# Patient Record
Sex: Male | Born: 1970 | Race: Black or African American | Hispanic: No | Marital: Single | Smoking: Current every day smoker
Health system: Southern US, Community
[De-identification: ages and names within clinical notes are randomized; demographics above are authoritative.]

## PROBLEM LIST (undated history)

## (undated) DIAGNOSIS — E785 Hyperlipidemia, unspecified: Secondary | ICD-10-CM

---

## 2004-06-19 ENCOUNTER — Emergency Department: Payer: Self-pay | Admitting: Emergency Medicine

## 2004-12-17 ENCOUNTER — Emergency Department: Payer: Self-pay | Admitting: Emergency Medicine

## 2005-06-29 ENCOUNTER — Emergency Department: Payer: Self-pay | Admitting: Emergency Medicine

## 2006-04-05 ENCOUNTER — Emergency Department: Payer: Self-pay | Admitting: General Practice

## 2008-02-07 IMAGING — CR DG KNEE COMPLETE 4+V*R*
1 series · 4 of 4 positions shown · non-contrast
Comparison: none

REASON FOR EXAM: fall   pain      rm 3
COMMENTS:

PROCEDURE:     DXR - DXR KNEE RT COMP WITH OBLIQUES  - April 05, 2006  [DATE]
RESULT:     Four views of the knee reveal the bones to be adequately
mineralized. I do not see evidence of an acute fracture nor of dislocation.
No joint effusion is seen.

[Series 1: view not recorded · 0.17mm/px · 4 of 4 slices shown]
[im 1/4]
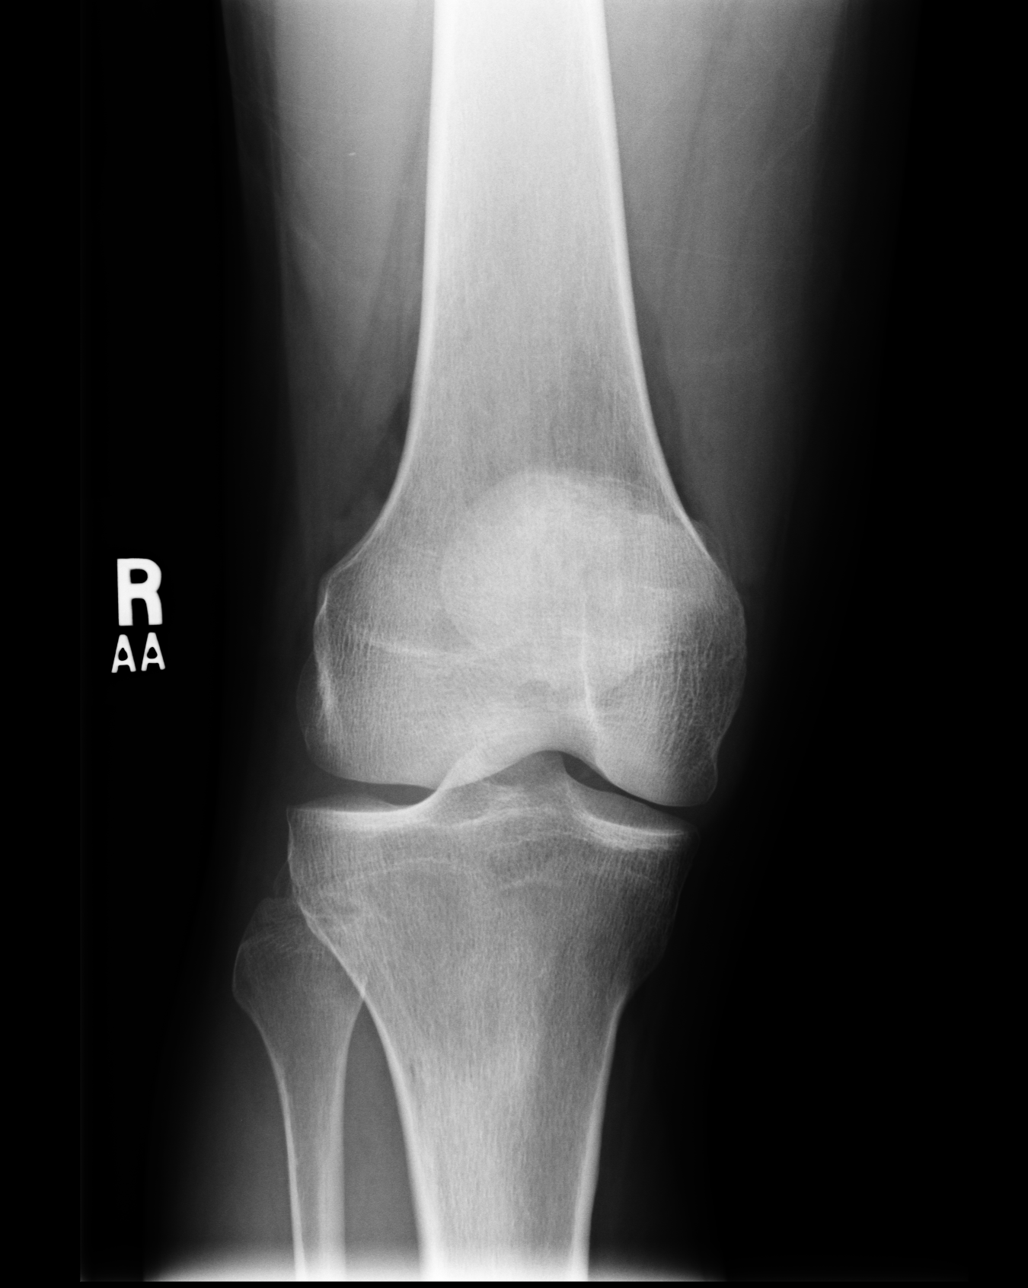
[im 2/4]
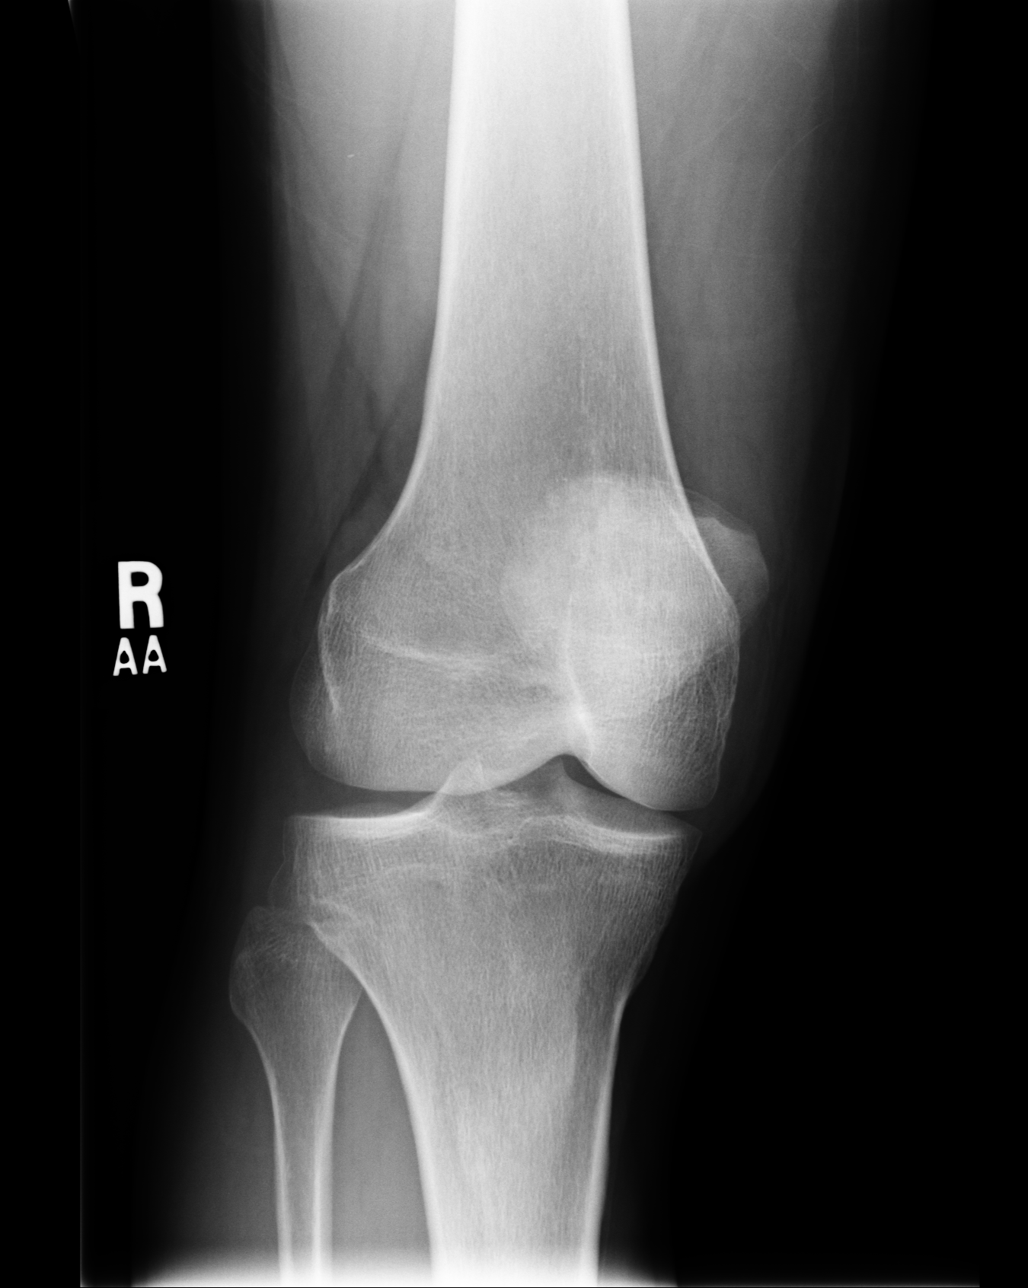
[im 3/4]
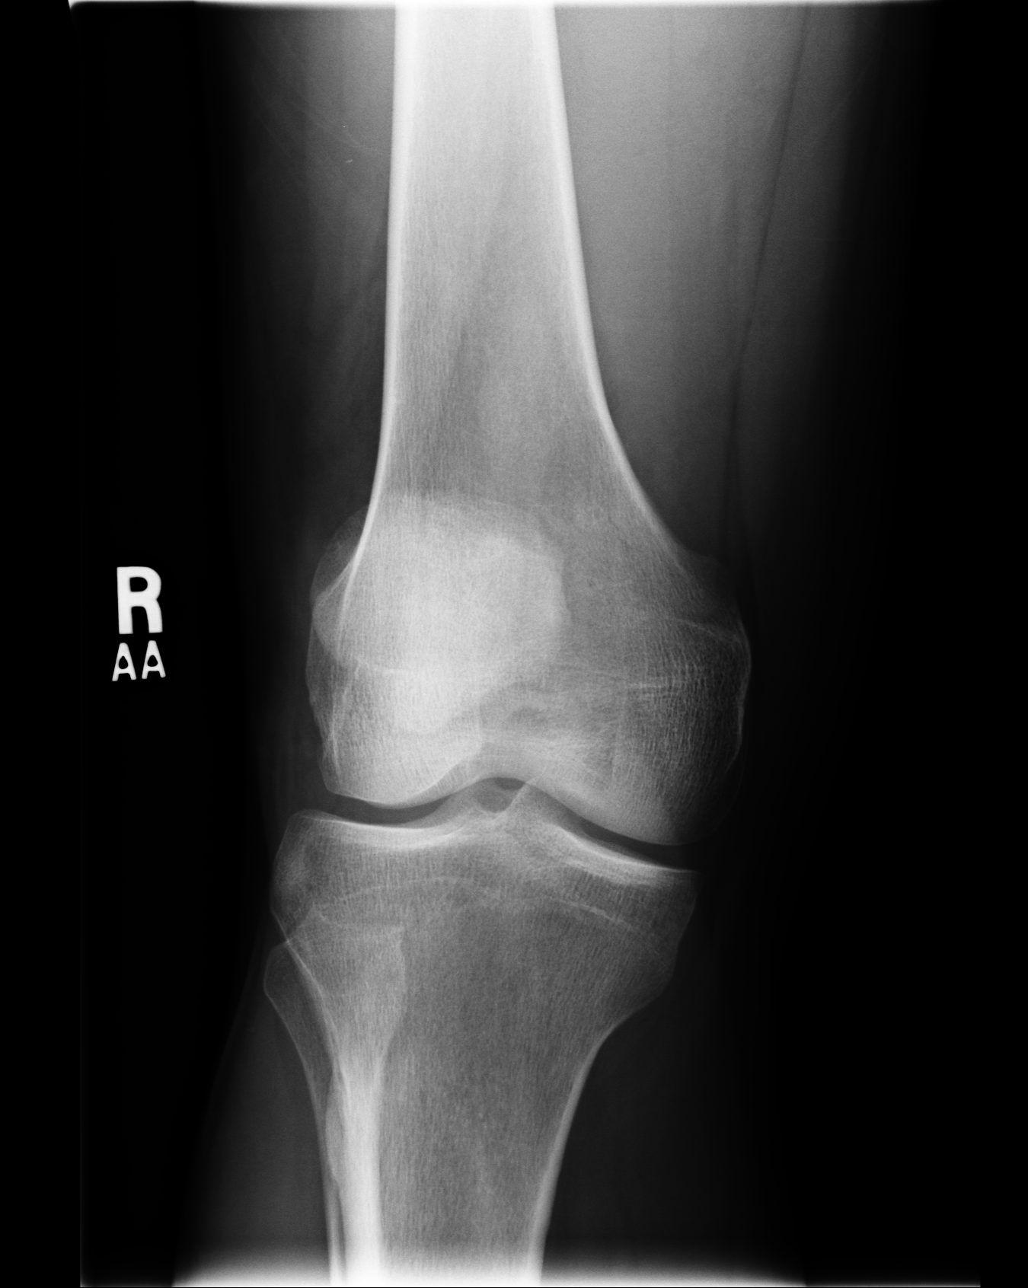
[im 4/4]
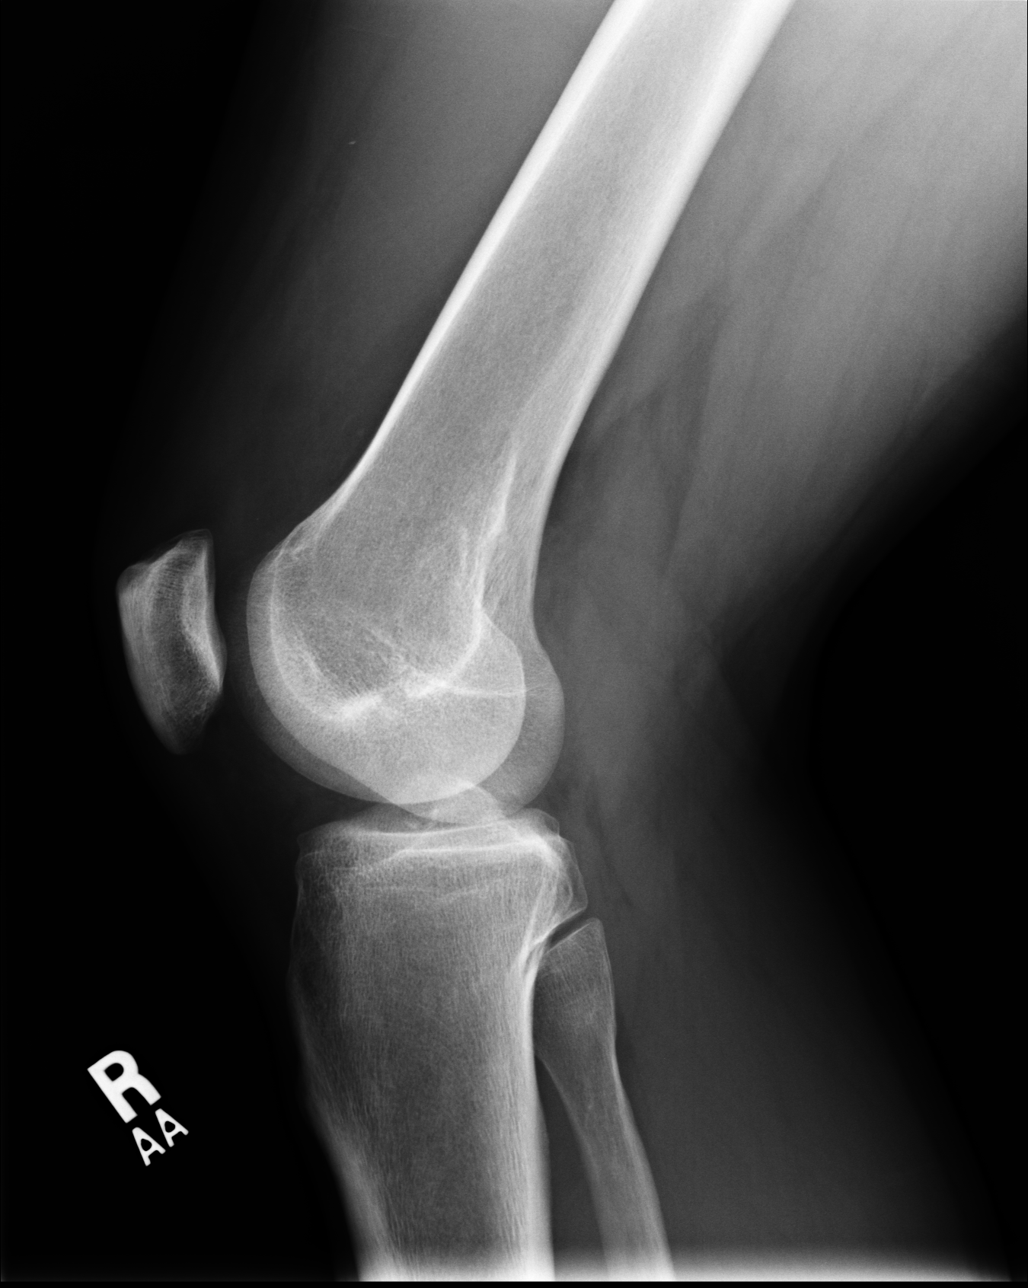

[4 of 4 positions shown; findings below may reference images not displayed]

IMPRESSION: 1)I see no acute bony abnormality of the RIGHT knee.

## 2011-03-09 ENCOUNTER — Ambulatory Visit: Payer: Self-pay | Admitting: Unknown Physician Specialty

## 2011-03-18 ENCOUNTER — Ambulatory Visit: Payer: Self-pay | Admitting: Unknown Physician Specialty

## 2011-04-18 ENCOUNTER — Ambulatory Visit: Payer: Self-pay | Admitting: Unknown Physician Specialty

## 2011-05-19 ENCOUNTER — Ambulatory Visit: Payer: Self-pay | Admitting: Unknown Physician Specialty

## 2015-05-17 ENCOUNTER — Encounter: Payer: Self-pay | Admitting: Emergency Medicine

## 2015-05-17 ENCOUNTER — Emergency Department
Admission: EM | Admit: 2015-05-17 | Discharge: 2015-05-17 | Disposition: A | Discharge status: Home / Self Care | Payer: Self-pay | Attending: Emergency Medicine | Admitting: Emergency Medicine

## 2015-05-17 DIAGNOSIS — H6092 Unspecified otitis externa, left ear: Secondary | ICD-10-CM | POA: Insufficient documentation

## 2015-05-17 DIAGNOSIS — Z720 Tobacco use: Secondary | ICD-10-CM | POA: Insufficient documentation

## 2015-05-17 MED ORDER — CIPROFLOXACIN-DEXAMETHASONE 0.3-0.1 % OT SUSP
4.0000 [drp] | Freq: Two times a day (BID) | OTIC | Status: DC
  Administered 2015-05-17: 4 [drp] via OTIC
  Filled 2015-05-17: qty 7.5

## 2015-05-17 MED ORDER — CIPROFLOXACIN HCL 500 MG PO TABS: 500.0000 mg | ORAL_TABLET | Freq: Two times a day (BID) | ORAL | Status: AC

## 2015-05-17 MED ORDER — CIPROFLOXACIN HCL 500 MG PO TABS
ORAL_TABLET | ORAL | Status: AC
  Filled 2015-05-17: qty 1

## 2015-05-17 MED ORDER — CIPROFLOXACIN-DEXAMETHASONE 0.3-0.1 % OT SUSP: 4.0000 [drp] | Freq: Two times a day (BID) | OTIC | Status: DC

## 2015-05-17 MED ORDER — CIPROFLOXACIN HCL 500 MG PO TABS
500.0000 mg | ORAL_TABLET | Freq: Once | ORAL | Status: AC
  Administered 2015-05-17: 500 mg via ORAL

## 2015-05-17 NOTE — ED Provider Notes (Signed)
[facility] Emergency Department Provider Note ____________________________________________  Time seen: 2035  I have reviewed the triage vital signs and the nursing notes.  HISTORY  Chief Complaint  Ear Drainage  HPI [name] is a 44 y.o. male reports to the ED with complaints of left ear drainage for the last week. He describes these had some increased purulent drainage, swelling, and decreased hearing to the left ear. 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 Over the last week he had been wearing his earplugs, but noticed some increased irritation with that. so in the last few days, he has been wearing earmuffs, given he's had some drainage from the ear. He reported today because he was concerned after he removed his earmuffs he noticed what he thought was a maggot in the cuff of the earmuffs, which she assumed came from his ear. He is here for evaluation management of his symptoms. Eyes any fever, chills, sweats, or dizziness.  History reviewed. No pertinent past medical history.  There are no active problems to display for this patient.  History reviewed. No pertinent past surgical history.  Current Outpatient Rx  Name  Route  Sig  Dispense  Refill  . ciprofloxacin (CIPRO) 500 MG tablet   Oral   Take 1 tablet (500 mg total) by mouth 2 (two) times daily.   14 tablet   0   . ciprofloxacin-dexamethasone (CIPRODEX) otic suspension   Left Ear   Place 4 drops into the left ear 2 (two) times daily.   7.5 mL   0    Allergies Review of patient's allergies indicates no known allergies.  History reviewed. No pertinent family history.  Social History Social History  Substance Use Topics  . Smoking status: Current Every Day Smoker -- 0.50 packs/day for 25 years    Types: Cigarettes  . Smokeless tobacco: None  . Alcohol Use: Yes   Review of Systems  Constitutional: Negative for fever. Eyes: Negative for  visual changes. ENT: Negative for sore throat. Left ear drainage.  Cardiovascular: Negative for chest pain. Respiratory: Negative for shortness of breath. Gastrointestinal: Negative for abdominal pain, vomiting and diarrhea. Genitourinary: Negative for dysuria. Musculoskeletal: Negative for back pain. Skin: Negative for rash. Neurological: Negative for headaches, focal weakness or numbness. ____________________________________________  PHYSICAL EXAM:  VITAL SIGNS: ED Triage Vitals  Enc Vitals Group     BP 05/17/15 1930 138/86 mmHg     Pulse Rate 05/17/15 1930 77     Resp 05/17/15 1930 18     Temp 05/17/15 1930 98.1 F (36.7 C)     Temp Source 05/17/15 1930 Oral     SpO2 05/17/15 1930 99 %     Weight 05/17/15 1930 172 lb (78.019 kg)     Height 05/17/15 1930 5\' 11"  (1.803 m)     Head Cir --      Peak Flow --      Pain Score 05/17/15 1931 4     Pain Loc --      Pain Edu? --      Excl. in GC? --    Constitutional: Alert and oriented. Well appearing and in no distress. Eyes: Conjunctivae are normal. PERRL. Normal extraocular movements. Ears: Left ear with edema and erythema to the pinna. Canal is swollen and TM is obscured by copious amount of purulent material. Minimally tender to manipulation of the tragus and pinna.   Head: Normocephalic and atraumatic.  Nose: No congestion/rhinnorhea.   Mouth/Throat: Mucous membranes are moist.   Neck: Supple. No thyromegaly. Hematological/Lymphatic/Immunilogical: No cervical lymphadenopathy. Cardiovascular: Normal rate, regular rhythm.  Respiratory: Normal respiratory effort. No wheezes/rales/rhonchi. Gastrointestinal: Soft and nontender. No distention. Musculoskeletal: Nontender with normal range of motion in all extremities.  Neurologic:  Normal gait without ataxia. Normal speech and language. No gross focal neurologic deficits are appreciated. Skin:  Skin is warm, dry and intact. No rash noted. Psychiatric: Mood and  affect are normal. Patient exhibits appropriate insight and judgment. _________  PROCEDURES Ear wick inserted in left ear Ciprodex 4 gtts to left ear ____________________________________________  INITIAL IMPRESSION / ASSESSMENT AND PLAN / ED COURSE  Acute left otitis externa with local ear cellulitis. Treatment with Ciprodex otic solution and Cipro BID x 7 days. Follow-up with Dr. Elenore Rota as needed.  ____________________________________________  FINAL CLINICAL IMPRESSION(S) / ED DIAGNOSES  Final diagnoses:  Otitis externa of left ear     Lissa Hoard, PA-C 05/17/15 2057  Myrna Blazer, MD 05/17/15 [phone]

## 2015-05-17 NOTE — ED Notes (Signed)
Pt arrived to the ED for discomfort on his left ear. Pt states that he wears ear protection at work and today he noticed that a maggot came out of his ear. Pt is AOx4, anxious.

## 2015-05-17 NOTE — Discharge Instructions (Signed)
Draining Ear °Ear wax, pus, blood and other fluids are examples of the different types of drainage from ears. Drops or cream may be needed to lessen the itching which may occur with ear drainage. °CAUSES  °· Skin irritations in the ear. °· Ear infection. °· Swimmer's ear. °· Ruptured eardrum. °· Foreign object in the ear canal. °· Sudden pressure changes. °· Head injury. °HOME CARE INSTRUCTIONS  °· Only take over-the-counter or prescription medicines for pain, fever, or discomfort as directed by your caregiver. °· Do not rub the ear canal with cotton-tipped swabs. °· Do not swim until your caregiver says it is okay. °· Before you take a shower, cover a cotton ball with petroleum jelly to keep water out. °· Limit exposure to smoke. Secondhand smoke can increase the chance for ear infections. °· Keep up with immunizations. °· Wash your hands well. °· Keep all follow-up appointments to examine the ear and evaluate hearing. °SEEK MEDICAL CARE IF:  °· You have increased drainage. °· You have ear pain, a fever, or drainage that is not getting better after 48 hours of antibiotics. °· You are unusually tired. °SEEK IMMEDIATE MEDICAL CARE IF: °· You have severe ear pain or headache. °· The patient is older than 3 months with a rectal or oral temperature of 102° F (38.9° C) or higher. °· The patient is 3 months old or younger with a rectal temperature of 100.4° F (38° C) or higher. °· You vomit. °· You feel dizzy. °· You have a seizure. °· You have new hearing loss. °MAKE SURE YOU:  °· Understand these instructions. °· Will watch your condition. °· Will get help right away if you are not doing well or get worse. °Document Released: 09/03/2005 Document Revised: 11/26/2011 Document Reviewed: 07/07/2009 °Exit[employer]® Patient Information ©2015 [employer]. This information is not intended to replace advice given to you by your health care provider. Make sure you discuss any questions you have with your health care  provider. ° °Otitis Externa °Otitis externa is a bacterial or fungal infection of the outer ear canal. This is the area from the eardrum to the outside of the ear. Otitis externa is sometimes called "swimmer's ear." °CAUSES  °Possible causes of infection include: °· Swimming in dirty water. °· Moisture remaining in the ear after swimming or bathing. °· Mild injury (trauma) to the ear. °· Objects stuck in the ear (foreign body). °· Cuts or scrapes (abrasions) on the outside of the ear. °SIGNS AND SYMPTOMS  °The first symptom of infection is often itching in the ear canal. Later signs and symptoms may include swelling and redness of the ear canal, ear pain, and yellowish-white fluid (pus) coming from the ear. The ear pain may be worse when pulling on the earlobe. °DIAGNOSIS  °Your health care provider will perform a physical exam. A sample of fluid may be taken from the ear and examined for bacteria or fungi. °TREATMENT  °Antibiotic ear drops are often given for 10 to 14 days. Treatment may also include pain medicine or corticosteroids to reduce itching and swelling. °HOME CARE INSTRUCTIONS  °· Apply antibiotic ear drops to the ear canal as prescribed by your health care provider. °· Take medicines only as directed by your health care provider. °· If you have diabetes, follow any additional treatment instructions from your health care provider. °· Keep all follow-up visits as directed by your health care provider. °PREVENTION  °· Keep your ear dry. Use the corner of a towel to absorb   water out of the ear canal after swimming or bathing.  Avoid scratching or putting objects inside your ear. This can damage the ear canal or remove the protective wax that lines the canal. This makes it easier for bacteria and fungi to grow.  Avoid swimming in lakes, polluted water, or poorly chlorinated pools.  You may use ear drops made of rubbing alcohol and vinegar after swimming. Combine equal parts of white vinegar and alcohol  in a bottle. Put 3 or 4 drops into each ear after swimming. SEEK MEDICAL CARE IF:   You have a fever.  Your ear is still red, swollen, painful, or draining pus after 3 days.  Your redness, swelling, or pain gets worse.  You have a severe headache.  You have redness, swelling, pain, or tenderness in the area behind your ear. MAKE SURE YOU:   Understand these instructions.  Will watch your condition.  Will get help right away if you are not doing well or get worse. Document Released: 09/03/2005 Document Revised: 01/18/2014 Document Reviewed: 09/20/2011 [facility] Patient Information 2015 [place], [place]. This information is not intended to replace advice given to you by your health care provider. Make sure you discuss any questions you have with your health care provider.  Use the ear drops as directed. Follow-up with Dr. Elenore Rota as needed.

## 2015-05-17 NOTE — ED Notes (Signed)
Patient thinks he has something in the left ear. States it has been going on for a week and a half but today he took his ear muffs off and "saw a worm, and I know that came out of my ear." Patient denies that it is painful but states that it has been draining. States "it's got me nervous."

## 2017-07-22 ENCOUNTER — Emergency Department
Admission: EM | Admit: 2017-07-22 | Discharge: 2017-07-22 | Disposition: A | Discharge status: Home / Self Care | Payer: Non-veteran care | Attending: Emergency Medicine | Admitting: Emergency Medicine

## 2017-07-22 DIAGNOSIS — F1290 Cannabis use, unspecified, uncomplicated: Secondary | ICD-10-CM | POA: Diagnosis not present

## 2017-07-22 DIAGNOSIS — F17210 Nicotine dependence, cigarettes, uncomplicated: Secondary | ICD-10-CM | POA: Insufficient documentation

## 2017-07-22 DIAGNOSIS — F1410 Cocaine abuse, uncomplicated: Secondary | ICD-10-CM

## 2017-07-22 DIAGNOSIS — J0100 Acute maxillary sinusitis, unspecified: Secondary | ICD-10-CM | POA: Diagnosis not present

## 2017-07-22 HISTORY — DX: Hyperlipidemia, unspecified: E78.5

## 2017-07-22 LAB — COMPREHENSIVE METABOLIC PANEL
ALBUMIN: 4.5 g/dL (ref 3.5–5.0)
ALT: 22 U/L (ref 17–63)
ANION GAP: 10 (ref 5–15)
AST: 24 U/L (ref 15–41)
Alkaline Phosphatase: 63 U/L (ref 38–126)
BILIRUBIN TOTAL: 1.1 mg/dL (ref 0.3–1.2)
BUN: 15 mg/dL (ref 6–20)
CO2: 26 mmol/L (ref 22–32)
Calcium: 9.1 mg/dL (ref 8.9–10.3)
Chloride: 101 mmol/L (ref 101–111)
Creatinine, Ser: 1.34 mg/dL — ABNORMAL HIGH (ref 0.61–1.24)
GFR calc non Af Amer: >60 mL/min (ref >60)
GLUCOSE: 116 mg/dL — AB (ref 65–99)
POTASSIUM: 3.6 mmol/L (ref 3.5–5.1)
Sodium: 137 mmol/L (ref 135–145)
TOTAL PROTEIN: 7.6 g/dL (ref 6.5–8.1)

## 2017-07-22 LAB — CBC
HCT: 42.6 % (ref 40.0–52.0)
Hemoglobin: 14.3 g/dL (ref 13.0–18.0)
MCH: 30 pg (ref 26.0–34.0)
MCHC: 33.6 g/dL (ref 32.0–36.0)
MCV: 89.2 fL (ref 80.0–100.0)
Platelets: 185 10*3/uL (ref 150–440)
RBC: 4.78 MIL/uL (ref 4.40–5.90)
RDW: 14.8 % — ABNORMAL HIGH (ref 11.5–14.5)
WBC: 5.9 10*3/uL (ref 3.8–10.6)

## 2017-07-22 LAB — ACETAMINOPHEN LEVEL

## 2017-07-22 LAB — SALICYLATE LEVEL

## 2017-07-22 LAB — ETHANOL

## 2017-07-22 MED ORDER — AMOXICILLIN-POT CLAVULANATE 875-125 MG PO TABS: 1.0000 | ORAL_TABLET | Freq: Two times a day (BID) | ORAL | 0 refills | Status: AC

## 2017-07-22 NOTE — ED Notes (Signed)

## 2017-07-22 NOTE — Discharge Instructions (Signed)

## 2017-07-22 NOTE — ED Notes (Signed)
BEHAVIORAL HEALTH ROUNDING Patient sleeping: Yes.   Patient alert and oriented: not applicable SLEEPING Behavior appropriate: Yes.  ; If no, describe: SLEEPING Nutrition and fluids offered: No SLEEPING Toileting and hygiene offered: NoSLEEPING Sitter present: not applicable, Q 15 min safety rounds and observation. Law enforcement present: Yes ODS 

## 2017-07-22 NOTE — ED Notes (Signed)
Pt reports hx of crack cocaine use and states he is just tired of living like he has been. States using all weekend. Pt reports he has never been through detox before. Pt is also complaining of sinus congestion.

## 2017-07-22 NOTE — [facility] Counselor (Signed)
Spoke with the patient about his drug use.  He stated he wants help to stop using crack.  He also uses marijuana.  He denied any excessive use of any other drugs or alcohol.  He has veteran's benefits, and not sure what services he can obtain.  He was given information about [facility] and [facility].  He was also advised to call the [facility] to determine what services they could provide for him.

## 2017-07-22 NOTE — ED Provider Notes (Signed)
[facility] Emergency Department Provider Note   ____________________________________________   First MD Initiated Contact with Patient 07/22/17 0131     (approximate)  I have reviewed the triage vital signs and the nursing notes.   HISTORY  Chief Complaint Addiction Problem    HPI [name] is a 46 y.o. male presents for evaluation to get treatment for cocaine abuse  Patient reports that he has been using cocaine for about 20 years.  Last week he does use cocaine daily, and estimates he spent about $500 on cocaine over this weekend.  Reports he has run out of money, and that he feels like he needs to make a life change and get clean.  Denies any inciting factors other than running out of money, and feeling tired of using regularly.  Denies thoughts of wanting to harm himself or anyone else.  Does occasionally drink alcohol, but reports that is not a daily issue for him and he does not experience withdrawals.  He did have some alcohol earlier today, but none over the evening.  No fevers or chills.  No nausea or vomiting.  Denies any concerns or pain except he has been experiencing some pain over his left frontal sinus for about a month with occasional drainage, feels like he is developed a "sinus infection".  Denies any hallucinations.  Past Medical History:  Diagnosis Date  . Hyperlipemia     There are no active problems to display for this patient.   History reviewed. No pertinent surgical history.  Prior to Admission medications   Medication Sig Start Date End Date Taking? Authorizing Provider  ciprofloxacin-dexamethasone (CIPRODEX) otic suspension Place 4 drops into the left ear 2 (two) times daily. 05/17/15   Menshew, Charlesetta Ivory, PA-C    Allergies Patient has no known allergies.  No family history on file.  Social History Social History   Tobacco Use  . Smoking status: Current Every Day Smoker    Packs/day: 0.50    Years: 25.00      Pack years: 12.50    Types: Cigarettes  . Smokeless tobacco: Never Used  Substance Use Topics  . Alcohol use: Yes  . Drug use: Yes    Types: Cocaine, Marijuana    Review of Systems Constitutional: No fever/chills Eyes: No visual changes. ENT: No sore throat.  See HPI Cardiovascular: Denies chest pain. Respiratory: Denies shortness of breath. Gastrointestinal: No abdominal pain.  No nausea, no vomiting.  No diarrhea.  No constipation. Genitourinary: Negative for dysuria. Musculoskeletal: Negative for back pain. Skin: Negative for rash. Neurological: Negative for focal weakness or numbness.    ____________________________________________   PHYSICAL EXAM:  VITAL SIGNS: ED Triage Vitals  Enc Vitals Group     BP 07/22/17 0054 121/78     Pulse Rate 07/22/17 0054 82     Resp 07/22/17 0054 19     Temp 07/22/17 0054 98.3 F (36.8 C)     Temp Source 07/22/17 0054 Oral     SpO2 07/22/17 0054 99 %     Weight 07/22/17 0054 180 lb (81.6 kg)     Height 07/22/17 0054 5\' 11"  (1.803 m)     Head Circumference --      Peak Flow --      Pain Score 07/22/17 0053 8     Pain Loc --      Pain Edu? --      Excl. in GC? --     Constitutional: Alert and oriented.  Well appearing and in no acute distress. Eyes: Conjunctivae are normal. Head: Atraumatic. Nose: Mild clear Charissa, he reports left maxillary sinus tenderness. Mouth/Throat: Mucous membranes are moist. Neck: No stridor.   Cardiovascular: Normal rate, regular rhythm. Grossly normal heart sounds.  Good peripheral circulation. Respiratory: Normal respiratory effort.  No retractions. Lungs CTAB. Gastrointestinal: Soft and nontender. No distention. Musculoskeletal: No lower extremity tenderness nor edema. Neurologic:  Normal speech and language. No gross focal neurologic deficits are appreciated.  Skin:  Skin is warm, dry and intact. No rash noted. Psychiatric: Mood and affect are normal. Speech and behavior are  normal.  ____________________________________________   LABS (all labs ordered are listed, but only abnormal results are displayed)  Labs Reviewed  COMPREHENSIVE METABOLIC PANEL - Abnormal; Notable for the following components:      Result Value   Glucose, Bld 116 (*)    Creatinine, Ser 1.34 (*)    All other components within normal limits  CBC - Abnormal; Notable for the following components:   RDW 14.8 (*)    All other components within normal limits  ACETAMINOPHEN LEVEL - Abnormal; Notable for the following components:   Acetaminophen (Tylenol), Serum <10 (*)    All other components within normal limits  ETHANOL  SALICYLATE LEVEL  URINE DRUG SCREEN, QUALITATIVE ([facility] ONLY)   ____________________________________________  EKG   ____________________________________________  RADIOLOGY   ____________________________________________   PROCEDURES  Procedure(s) performed: None  Procedures  Critical Care performed: No  ____________________________________________   INITIAL IMPRESSION / ASSESSMENT AND PLAN / ED COURSE  Pertinent labs & imaging results that were available during my care of the patient were reviewed by me and considered in my medical decision making (see chart for details).  Substance abuse, reports cocaine use and wishes for detox.  He is alert and oriented, does not appear presently intoxicated or under the influence.  We will refer him to residential treatment services for outpatient management.  Sinus congestion, appears likely left maxillary sinusitis by exam without complication.  Reports symptoms for a month, at this point I will place him on antibiotic for treatment.  Return precautions and treatment recommendations and follow-up discussed with the patient who is agreeable with the plan.       ____________________________________________   FINAL CLINICAL IMPRESSION(S) / ED DIAGNOSES  Final diagnoses:  Cocaine abuse (HCC)  Acute  non-recurrent maxillary sinusitis      NEW MEDICATIONS STARTED DURING THIS VISIT:  This SmartLink is deprecated. Use AVSMEDLIST instead to display the medication list for a patient.   Note:  This document was prepared using Dragon voice recognition software and may include unintentional dictation errors.     Sharyn CreamerQuale, Maliah Pyles, MD 07/22/17 [phone]

## 2017-07-22 NOTE — ED Triage Notes (Signed)
Patient requesting help with detox from cocaine. Patient reports last used approximately 5 hours.

## 2017-08-29 ENCOUNTER — Emergency Department
Admission: EM | Admit: 2017-08-29 | Discharge: 2017-08-29 | Disposition: A | Discharge status: Home / Self Care | Payer: Non-veteran care | Attending: Emergency Medicine | Admitting: Emergency Medicine

## 2017-08-29 ENCOUNTER — Emergency Department: Payer: Non-veteran care

## 2017-08-29 ENCOUNTER — Encounter: Payer: Self-pay | Admitting: Emergency Medicine

## 2017-08-29 DIAGNOSIS — E785 Hyperlipidemia, unspecified: Secondary | ICD-10-CM | POA: Insufficient documentation

## 2017-08-29 DIAGNOSIS — F17210 Nicotine dependence, cigarettes, uncomplicated: Secondary | ICD-10-CM | POA: Insufficient documentation

## 2017-08-29 DIAGNOSIS — L03116 Cellulitis of left lower limb: Secondary | ICD-10-CM

## 2017-08-29 DIAGNOSIS — L97529 Non-pressure chronic ulcer of other part of left foot with unspecified severity: Secondary | ICD-10-CM

## 2017-08-29 LAB — COMPREHENSIVE METABOLIC PANEL
ALK PHOS: 68 U/L (ref 38–126)
ALT: 27 U/L (ref 17–63)
AST: 26 U/L (ref 15–41)
Albumin: 4.4 g/dL (ref 3.5–5.0)
Anion gap: 9 (ref 5–15)
BUN: 16 mg/dL (ref 6–20)
CHLORIDE: 104 mmol/L (ref 101–111)
CO2: 24 mmol/L (ref 22–32)
CREATININE: 1.12 mg/dL (ref 0.61–1.24)
Calcium: 9.1 mg/dL (ref 8.9–10.3)
GFR calc Af Amer: >60 mL/min (ref >60)
GLUCOSE: 100 mg/dL — AB (ref 65–99)
POTASSIUM: 3.7 mmol/L (ref 3.5–5.1)
SODIUM: 137 mmol/L (ref 135–145)
Total Bilirubin: 0.6 mg/dL (ref 0.3–1.2)
Total Protein: 7.4 g/dL (ref 6.5–8.1)

## 2017-08-29 LAB — CBC WITH DIFFERENTIAL/PLATELET
Basophils Absolute: 0 10*3/uL (ref 0–0.1)
Basophils Relative: 0 %
EOS ABS: 0.2 10*3/uL (ref 0–0.7)
EOS PCT: 3 %
HCT: 44.5 % (ref 40.0–52.0)
Hemoglobin: 15 g/dL (ref 13.0–18.0)
LYMPHS ABS: 2.6 10*3/uL (ref 1.0–3.6)
LYMPHS PCT: 35 %
MCH: 30.7 pg (ref 26.0–34.0)
MCHC: 33.7 g/dL (ref 32.0–36.0)
MCV: 91.1 fL (ref 80.0–100.0)
MONO ABS: 0.8 10*3/uL (ref 0.2–1.0)
Monocytes Relative: 11 %
Neutro Abs: 3.8 10*3/uL (ref 1.4–6.5)
Neutrophils Relative %: 51 %
PLATELETS: 197 10*3/uL (ref 150–440)
RBC: 4.88 MIL/uL (ref 4.40–5.90)
RDW: 14.8 % — AB (ref 11.5–14.5)
WBC: 7.5 10*3/uL (ref 3.8–10.6)

## 2017-08-29 LAB — LACTIC ACID, PLASMA: Lactic Acid, Venous: 0.9 mmol/L (ref 0.5–1.9)

## 2017-08-29 MED ORDER — SULFAMETHOXAZOLE-TRIMETHOPRIM 800-160 MG PO TABS: 1.0000 | ORAL_TABLET | Freq: Two times a day (BID) | ORAL | 0 refills | Status: AC

## 2017-08-29 MED ORDER — VANCOMYCIN HCL IN DEXTROSE 1-5 GM/200ML-% IV SOLN
1000.0000 mg | Freq: Once | INTRAVENOUS | Status: AC
  Administered 2017-08-29: 1000 mg via INTRAVENOUS
  Filled 2017-08-29: qty 200

## 2017-08-29 MED ORDER — TRAMADOL HCL 50 MG PO TABS: 50.0000 mg | ORAL_TABLET | Freq: Four times a day (QID) | ORAL | 0 refills | Status: AC | PRN

## 2017-08-29 NOTE — ED Provider Notes (Signed)
[facility] Emergency Department Provider Note  Time seen: 8:13 PM  I have reviewed the triage vital signs and the nursing notes.   HISTORY  Chief Complaint Foot Pain    HPI [name] is a 46 y.o. male with a past medical history of hyperlipidemia who presents to the emergency department for left foot ulceration/infection.  According to the patient for the past 2 months he has been experiencing some discomfort and what appeared to be a ulcer and blister to the left fifth toe.  He states the blister had progressively worsened and several days ago it ruptured.  Patient states a foul smell for several weeks from the foot.  Denies any fever.  Denies any history of diabetes but does state neuropathy/tingling in his bilateral feet chronically.    Past Medical History:  Diagnosis Date  . Hyperlipemia     There are no active problems to display for this patient.   History reviewed. No pertinent surgical history.  Prior to Admission medications   Medication Sig Start Date End Date Taking? Authorizing Provider  amoxicillin-clavulanate (AUGMENTIN) 875-125 MG tablet Take 1 tablet 2 (two) times daily by mouth. 07/22/17   Sharyn CreamerQuale, Mark, MD    No Known Allergies  No family history on file.  Social History Social History   Tobacco Use  . Smoking status: Current Every Day Smoker    Packs/day: 0.50    Years: 25.00    Pack years: 12.50    Types: Cigarettes  . Smokeless tobacco: Never Used  Substance Use Topics  . Alcohol use: Yes  . Drug use: Yes    Types: Cocaine, Marijuana    Review of Systems Constitutional: Negative for fever Cardiovascular: Negative for chest pain. Respiratory: Negative for shortness of breath. Gastrointestinal: Negative for abdominal pain, vomiting Musculoskeletal: Left foot drainage, foul smell and blistering. Skin: Blistering to bottom of left foot. Neurological: Negative for headache All other ROS  negative  ____________________________________________   PHYSICAL EXAM:  VITAL SIGNS: ED Triage Vitals  Enc Vitals Group     BP 08/29/17 1723 (!) 139/93     Pulse Rate 08/29/17 1723 85     Resp 08/29/17 1723 18     Temp 08/29/17 1723 98.2 F (36.8 C)     Temp Source 08/29/17 1723 Oral     SpO2 08/29/17 1723 94 %     Weight 08/29/17 1723 184 lb (83.5 kg)     Height 08/29/17 1723 5\' 11"  (1.803 m)     Head Circumference --      Peak Flow --      Pain Score 08/29/17 1732 6     Pain Loc --      Pain Edu? --      Excl. in GC? --    Constitutional: Alert and oriented. Well appearing and in no distress. Eyes: Normal exam ENT   Head: Normocephalic and atraumatic   Mouth/Throat: Mucous membranes are moist. Cardiovascular: Normal rate, regular rhythm. No murmur Respiratory: Normal respiratory effort without tachypnea nor retractions. Breath sounds are clear Gastrointestinal: Soft and nontender. No distention.  Musculoskeletal: Patient has ulceration to the left distal foot plantar and dorsal aspects consistent with large ruptured blister. Neurologic:  Normal speech and language. No gross focal neurologic deficits Skin: Patient has what appears to be a ruptured blister to the plantar aspect of the distal left foot, ulceration involving the lateral aspect of the fifth toe, erythema and drainage of the skin overlying the left fourth  and fifth toes as well as distal foot.  No pus/exudate drainage. Psychiatric: Mood and affect are normal.   ____________________________________________    RADIOLOGY  IMPRESSION: 1. Soft tissue ulcer along the plantar lateral aspect of the forefoot at the level of the fifth MTP. 2. The head of the adjacent fifth metatarsal is slightly osteopenic and there is a slightly indistinct appearance of the fifth metatarsal head laterally raising concern for changes of early osteomyelitis.  ____________________________________________   INITIAL  IMPRESSION / ASSESSMENT AND PLAN / ED COURSE  Pertinent labs & imaging results that were available during my care of the patient were reviewed by me and considered in my medical decision making (see chart for details).  Patient presents to the emergency department for worsening drainage, smell and redness to his left foot.  Patient states symptoms have been ongoing for months but have been getting worse over the past several weeks.  States the blister ruptured approximately 1-2 weeks ago, he was hoping that it would get better states that it has not so he came to the emergency department.  Differential includes cellulitis, osteomyelitis, diabetic foot ulcer, pressure ulcer.  Patient's labs overall appear very well, lactic acid is normal.  CBC including WBC is normal.  Glucose is normal.  No history of diabetes per patient.  X-ray shows soft tissue edema with possible early osteomyelitis.  Overall the patient appears well, no distress, afebrile denies any fever throughout the last few months.  Examination is most consistent with a pressure ulcer with now cellulitis.  We will place the patient on antibiotics.  I discussed in depth with the patient and showed him how to change the dressing daily.  He is to wash with warm water and soap once daily allowed to air dry change the dressing once daily and take antibiotics.  Patient will follow up with the wound care center.  Patient is agreeable to this plan of care.  GFR is normal.  We will place the patient on 14 days of Bactrim to be taken twice daily along with daily dressing changes and washings.  Patient agreeable to plan.  ____________________________________________   FINAL CLINICAL IMPRESSION(S) / ED DIAGNOSES  Foot ulceration Cellulitis    Minna AntisPaduchowski, Leani Myron, MD 08/29/17 2019

## 2017-08-29 NOTE — Discharge Instructions (Signed)
As we discussed please take your antibiotics as prescribed.  Return to the emergency department for any fever or signs of worsening/spreading infection.  Otherwise please call the number provided for the wound center to arrange a follow-up appointment next week for recheck.

## 2017-08-29 NOTE — ED Triage Notes (Signed)
Pt to ED via POV with c/o LFT foot pain, pt states skin has been "peeling" xcouple weeks. Pt c/o odor. Pt has + swelling and open wound to bottom of foot noted. Pt A&Ox4. Denies diabetes

## 2017-08-29 NOTE — ED Notes (Signed)
First nurse note  Presents with pain and swelling to left foot

## 2017-09-04 ENCOUNTER — Encounter: Payer: No Typology Code available for payment source | Attending: Internal Medicine | Admitting: Internal Medicine

## 2017-09-04 ENCOUNTER — Other Ambulatory Visit
Admission: RE | Admit: 2017-09-04 | Discharge: 2017-09-04 | Disposition: A | Discharge status: Home / Self Care | Payer: No Typology Code available for payment source | Source: Ambulatory Visit | Attending: Internal Medicine | Admitting: Internal Medicine

## 2017-09-04 DIAGNOSIS — L97522 Non-pressure chronic ulcer of other part of left foot with fat layer exposed: Secondary | ICD-10-CM | POA: Insufficient documentation

## 2017-09-04 DIAGNOSIS — B999 Unspecified infectious disease: Secondary | ICD-10-CM | POA: Insufficient documentation

## 2017-09-04 DIAGNOSIS — B353 Tinea pedis: Secondary | ICD-10-CM | POA: Insufficient documentation

## 2017-09-04 DIAGNOSIS — F17210 Nicotine dependence, cigarettes, uncomplicated: Secondary | ICD-10-CM | POA: Insufficient documentation

## 2017-09-04 DIAGNOSIS — L03116 Cellulitis of left lower limb: Secondary | ICD-10-CM | POA: Insufficient documentation

## 2017-09-04 DIAGNOSIS — E785 Hyperlipidemia, unspecified: Secondary | ICD-10-CM | POA: Insufficient documentation

## 2017-09-05 NOTE — Progress Notes (Signed)
[name], [name] ([id number]) Visit Report for 09/04/2017 Abuse/Suicide Risk Screen Details Patient Name: [name], Jahid A. Date of Service: 09/04/2017 10:30 AM Medical Record Number: [id number] Patient Account Number: [id number] Date of Birth/Sex: 09/14/1971 [age] y.o. Male) Treating RN: Phillis HaggisPinkerton, Debi Primary Care Marian Grandt: PATIENT, NO Other Clinician: Referring Tu Shimmel: Minna AntisPADUCHOWSKI, KEVIN Treating Randye Treichler/Extender: Maxwell CaulOBSON, MICHAEL G Weeks in Treatment: 0 Abuse/Suicide Risk Screen Items Answer ABUSE/SUICIDE RISK SCREEN: Has anyone close to you tried to hurt or harm you recentlyo No Do you feel uncomfortable with anyone in your familyo No Has anyone forced you do things that you didnot want to doo No Do you have any thoughts of harming yourselfo No Patient displays signs or symptoms of abuse and/or neglect. No Electronic Signature(s) Signed: 09/04/2017 4:32:17 PM By: Alejandro MullingPinkerton, Debra Entered By: Alejandro MullingPinkerton, Debra on 09/04/2017 10:52:10 [name], [name] ([id number]) -------------------------------------------------------------------------------- Activities of Daily Living Details Patient Name: [name], [name] Date of Service: 09/04/2017 10:30 AM Medical Record Number: [id number] Patient Account Number: [id number] Date of Birth/Sex: 10/12/1970 [age] y.o. Male) Treating RN: Phillis HaggisPinkerton, Debi Primary Care Lenus Trauger: PATIENT, NO Other Clinician: Referring Nakota Ackert: Minna AntisPADUCHOWSKI, KEVIN Treating Christle Nolting/Extender: Maxwell CaulOBSON, MICHAEL G Weeks in Treatment: 0 Activities of Daily Living Items Answer Activities of Daily Living (Please select one for each item) Drive Automobile Completely Able Take Medications Completely Able Use Telephone Completely Able Care for Appearance Completely Able Use Toilet Completely Able Bath / Shower Completely Able Dress Self Completely Able Feed Self Completely Able Walk Need Assistance Get In / Out Bed Completely Able Housework Need Assistance Prepare Meals Need  Assistance Handle Money Completely Able Shop for Self Need Assistance Electronic Signature(s) Signed: 09/04/2017 4:32:17 PM By: Alejandro MullingPinkerton, Debra Entered By: Alejandro MullingPinkerton, Debra on 09/04/2017 10:52:36 [name], [name] ([id number]) -------------------------------------------------------------------------------- Education Assessment Details Patient Name: [name], [name] Date of Service: 09/04/2017 10:30 AM Medical Record Number: [id number] Patient Account Number: [id number] Date of Birth/Sex: 08/06/1971 [age] y.o. Male) Treating RN: Phillis HaggisPinkerton, Debi Primary Care Angelos Wasco: PATIENT, NO Other Clinician: Referring Teagan Ozawa: Minna AntisPADUCHOWSKI, KEVIN Treating Delaine Hernandez/Extender: Altamese CarolinaOBSON, MICHAEL G Weeks in Treatment: 0 Primary Learner Assessed: Patient Learning Preferences/Education Level/Primary Language Learning Preference: Explanation, Printed Material Highest Education Level: High School Preferred Language: English Cognitive Barrier Assessment/Beliefs Language Barrier: No Translator Needed: No Memory Deficit: No Emotional Barrier: No Cultural/Religious Beliefs Affecting Medical Care: No Physical Barrier Assessment Impaired Vision: No Impaired Hearing: No Decreased Hand dexterity: No Knowledge/Comprehension Assessment Knowledge Level: Medium Comprehension Level: Medium Ability to understand written Medium instructions: Ability to understand verbal Medium instructions: Motivation Assessment Anxiety Level: Calm Cooperation: Cooperative Education Importance: Acknowledges Need Interest in Health Problems: Asks Questions Perception: Coherent Willingness to Engage in Self- Medium Management Activities: Readiness to Engage in Self- Medium Management Activities: Electronic Signature(s) Signed: 09/04/2017 4:32:17 PM By: Alejandro MullingPinkerton, Debra Entered By: Alejandro MullingPinkerton, Debra on 09/04/2017 10:52:56 [name], [name]  ([id number]) -------------------------------------------------------------------------------- Fall Risk Assessment Details Patient Name: [name], [name] Date of Service: 09/04/2017 10:30 AM Medical Record Number: [id number] Patient Account Number: [id number] Date of Birth/Sex: 02/08/1971 [age] y.o. Male) Treating RN: Ashok CordiaPinkerton, Debi Primary Care Avigdor Dollar: PATIENT, NO Other Clinician: Referring Marcayla Budge: Minna AntisPADUCHOWSKI, KEVIN Treating Grisell Bissette/Extender: Maxwell CaulOBSON, MICHAEL G Weeks in Treatment: 0 Fall Risk Assessment Items Have you had 2 or more falls in the last 12 monthso 0 No Have you had any fall that resulted in injury in the last 12 monthso 0 No FALL RISK ASSESSMENT: History of falling - immediate or within 3 months 0 No Secondary diagnosis 15 Yes Ambulatory aid None/bed rest/wheelchair/nurse 0 No Crutches/cane/walker 15 Yes Furniture  0 No IV Access/Saline Lock 0 No Gait/Training Normal/bed rest/immobile 0 No Weak 0 No Impaired 0 No Mental Status Oriented to own ability 0 Yes Electronic Signature(s) Signed: 09/04/2017 4:32:17 PM By: Alejandro MullingPinkerton, Debra Entered By: Alejandro MullingPinkerton, Debra on 09/04/2017 10:53:13 [name], [name] ([id number]) -------------------------------------------------------------------------------- Foot Assessment Details Patient Name: [name], [name] A. Date of Service: 09/04/2017 10:30 AM Medical Record Number: [id number] Patient Account Number: [id number] Date of Birth/Sex: 08/06/1971 [age] y.o. Male) Treating RN: Phillis HaggisPinkerton, Debi Primary Care Moncerrath Berhe: PATIENT, NO Other Clinician: Referring Alekzander Cardell: Minna AntisPADUCHOWSKI, KEVIN Treating Rubie Ficco/Extender: Maxwell CaulOBSON, MICHAEL G Weeks in Treatment: 0 Foot Assessment Items Site Locations + = Sensation present, - = Sensation absent, C = Callus, U = Ulcer R = Redness, W = Warmth, M = Maceration, PU = Pre-ulcerative lesion F = Fissure, S = Swelling, D = Dryness Assessment Right: Left: Other Deformity: No No Prior Foot Ulcer: No No Prior  Amputation: No No Charcot Joint: No No Ambulatory Status: Ambulatory With Help Assistance Device: Crutches Gait: Steady Electronic Signature(s) Signed: 09/04/2017 4:32:17 PM By: Alejandro MullingPinkerton, Debra Entered By: Alejandro MullingPinkerton, Debra on 09/04/2017 10:57:54 [name], [name] ([id number]) -------------------------------------------------------------------------------- Nutrition Risk Assessment Details Patient Name: [name], [name] Date of Service: 09/04/2017 10:30 AM Medical Record Number: [id number] Patient Account Number: [id number] Date of Birth/Sex: 06/06/1971 [age] y.o. Male) Treating RN: Phillis HaggisPinkerton, Debi Primary Care Brenson Hartman: PATIENT, NO Other Clinician: Referring Esty Ahuja: Minna AntisPADUCHOWSKI, KEVIN Treating Hoover Grewe/Extender: Maxwell CaulOBSON, MICHAEL G Weeks in Treatment: 0 Height (in): 71 Weight (lbs): 180 Body Mass Index (BMI): 25.1 Nutrition Risk Assessment Items NUTRITION RISK SCREEN: I have an illness or condition that made me change the kind and/or amount of 0 No food I eat I eat fewer than two meals per day 0 No I eat few fruits and vegetables, or milk products 0 No I have three or more drinks of beer, liquor or wine almost every day 0 No I have tooth or mouth problems that make it hard for me to eat 0 No I don't always have enough money to buy the food I need 0 No I eat alone most of the time 0 No I take three or more different prescribed or over-the-counter drugs a day 0 No Without wanting to, I have lost or gained 10 pounds in the last six months 0 No I am not always physically able to shop, cook and/or feed myself 0 No Nutrition Protocols Good Risk Protocol 0 No interventions needed Moderate Risk Protocol Electronic Signature(s) Signed: 09/04/2017 4:32:17 PM By: Alejandro MullingPinkerton, Debra Entered By: Alejandro MullingPinkerton, Debra on 09/04/2017 10:53:21

## 2017-09-08 NOTE — Progress Notes (Signed)
Barrett HenleCARR, Sederick A. ([id number]) Visit Report for 09/04/2017 Allergy List Details Patient Name: Marc DragonCARR, Cohan A. Date of Service: 09/04/2017 10:30 AM Medical Record Number: [id number] Patient Account Number: [id number] Date of Birth/Sex: 02/18/1971 [age] y.o. Male) Treating RN: Phillis HaggisPinkerton, Debi Primary Care Chayne Baumgart: PATIENT, NO Other Clinician: Referring Brandace Cargle: Minna AntisPADUCHOWSKI, KEVIN Treating Clary Boulais/Extender: Maxwell CaulOBSON, MICHAEL G Weeks in Treatment: 0 Allergies Active Allergies NKDA Allergy Notes Electronic Signature(s) Signed: 09/04/2017 4:32:17 PM By: Alejandro MullingPinkerton, Debra Entered By: Alejandro MullingPinkerton, Debra on 09/04/2017 10:47:30 Wombles, Aerik A. ([id number]) -------------------------------------------------------------------------------- Arrival Information Details Patient Name: Marc DragonARR, Frederik A. Date of Service: 09/04/2017 10:30 AM Medical Record Number: [id number] Patient Account Number: [id number] Date of Birth/Sex: 09/17/1970 [age] y.o. Male) Treating RN: Phillis HaggisPinkerton, Debi Primary Care Yael Coppess: PATIENT, NO Other Clinician: Referring Paiton Fosco: Minna AntisPADUCHOWSKI, KEVIN Treating Altus Zaino/Extender: Altamese CarolinaOBSON, MICHAEL G Weeks in Treatment: 0 Visit Information Patient Arrived: Crutches Arrival Time: 10:42 Accompanied By: girlfriend Transfer Assistance: EasyPivot Patient Lift Patient Identification Verified: Yes Secondary Verification Process Yes Completed: Patient Requires Transmission-Based No Precautions: Patient Has Alerts: No Electronic Signature(s) Signed: 09/04/2017 4:32:17 PM By: Alejandro MullingPinkerton, Debra Entered By: Alejandro MullingPinkerton, Debra on 09/04/2017 10:43:31 Carreno, Eilam A. ([id number]) -------------------------------------------------------------------------------- Clinic Level of Care Assessment Details Patient Name: Marc DragonARR, Nasiir A. Date of Service: 09/04/2017 10:30 AM Medical Record Number: [id number] Patient Account Number: [id number] Date of Birth/Sex: 07/05/1971 [age] y.o. Male) Treating RN: Phillis HaggisPinkerton,  Debi Primary Care Asjia Berrios: PATIENT, NO Other Clinician: Referring Dalexa Gentz: Minna AntisPADUCHOWSKI, KEVIN Treating Alta Shober/Extender: Maxwell CaulOBSON, MICHAEL G Weeks in Treatment: 0 Clinic Level of Care Assessment Items TOOL 1 Quantity Score X - Use when EandM and Procedure is performed on INITIAL visit 1 0 ASSESSMENTS - Nursing Assessment / Reassessment X - General Physical Exam (combine w/ comprehensive assessment (listed just below) when 1 20 performed on new pt. evals) X- 1 25 Comprehensive Assessment (HX, ROS, Risk Assessments, Wounds Hx, etc.) ASSESSMENTS - Wound and Skin Assessment / Reassessment []  - Dermatologic / Skin Assessment (not related to wound area) 0 ASSESSMENTS - Ostomy and/or Continence Assessment and Care []  - Incontinence Assessment and Management 0 []  - 0 Ostomy Care Assessment and Management (repouching, etc.) PROCESS - Coordination of Care X - Simple Patient / Family Education for ongoing care 1 15 []  - 0 Complex (extensive) Patient / Family Education for ongoing care []  - 0 Staff obtains ChiropractorConsents, Records, Test Results / Process Orders []  - 0 Staff telephones HHA, Nursing Homes / Clarify orders / etc []  - 0 Routine Transfer to another Facility (non-emergent condition) []  - 0 Routine Hospital Admission (non-emergent condition) X- 1 15 New Admissions / Manufacturing engineernsurance Authorizations / Ordering NPWT, Apligraf, etc. []  - 0 Emergency Hospital Admission (emergent condition) PROCESS - Special Needs []  - Pediatric / Minor Patient Management 0 []  - 0 Isolation Patient Management []  - 0 Hearing / Language / Visual special needs []  - 0 Assessment of Community assistance (transportation, D/C planning, etc.) []  - 0 Additional assistance / Altered mentation []  - 0 Support Surface(s) Assessment (bed, cushion, seat, etc.) Warchol, Nickoles A. ([id number]) INTERVENTIONS - Miscellaneous []  - External ear exam 0 []  - 0 Patient Transfer (multiple staff / Nurse, adultHoyer Lift / Similar devices) []  -  0 Simple Staple / Suture removal (25 or less) []  - 0 Complex Staple / Suture removal (26 or more) []  - 0 Hypo/Hyperglycemic Management (do not check if billed separately) X- 1 15 Ankle / Brachial Index (ABI) - do not check if billed separately Has the patient been seen at the hospital within the last three years: Yes Total  Score: 90 Level Of Care: New/Established - Level 3 Electronic Signature(s) Signed: 09/04/2017 4:32:17 PM By: Alejandro Mulling Entered By: Alejandro Mulling on 09/04/2017 14:37:11 Peale, Birl A. ([id number]) -------------------------------------------------------------------------------- Encounter Discharge Information Details Patient Name: [name], Jovonni A. Date of Service: 09/04/2017 10:30 AM Medical Record Number: [id number] Patient Account Number: [id number] Date of Birth/Sex: 04-10-1971 (46 y.o. Male) Treating RN: Phillis Haggis Primary Care Declyn Offield: PATIENT, NO Other Clinician: Referring Chayil Gantt: Minna Antis Treating Bastian Andreoli/Extender: Altamese Brooks in Treatment: 0 Encounter Discharge Information Items Discharge Pain Level: 0 Discharge Condition: Stable Ambulatory Status: Crutches Discharge Destination: Home Transportation: Private Auto Accompanied By: girlfriend Schedule Follow-up Appointment: Yes Medication Reconciliation completed and No provided to Patient/Care Aviv Rota: Provided on Clinical Summary of Care: 09/04/2017 Form Type Recipient Paper Patient DC Electronic Signature(s) Signed: 09/04/2017 12:34:50 PM By: Alejandro Mulling Entered By: Alejandro Mulling on 09/04/2017 12:34:50 Wardlow, Tre A. ([id number]) -------------------------------------------------------------------------------- Lower Extremity Assessment Details Patient Name: [name], Mayan A. Date of Service: 09/04/2017 10:30 AM Medical Record Number: [id number] Patient Account Number: [id number] Date of Birth/Sex: 26-Sep-1970 (46 y.o. Male) Treating RN: Phillis Haggis Primary Care Imogene Gravelle: PATIENT, NO Other Clinician: Referring Junell Cullifer: Minna Antis Treating Dominica Kent/Extender: Maxwell Caul Weeks in Treatment: 0 Vascular Assessment Pulses: Dorsalis Pedis Palpable: [Left:Yes] [Right:Yes] Posterior Tibial Palpable: [Left:Yes] [Right:Yes] Extremity colors, hair growth, and conditions: Extremity Color: [Left:Normal] [Right:Normal] Hair Growth on Extremity: [Left:Yes] [Right:Yes] Temperature of Extremity: [Left:Warm] [Right:Warm] Capillary Refill: [Left:< 3 seconds] [Right:< 3 seconds] Blood Pressure: Brachial: [Left:108] [Right:108] Dorsalis Pedis: 138 [Left:Dorsalis Pedis: 130] Ankle: Posterior Tibial: 130 [Left:Posterior Tibial: 140 1.28] [Right:1.30] Toe Nail Assessment Left: Right: Thick: Yes Yes Discolored: Yes Yes Deformed: No No Improper Length and Hygiene: Yes Yes Electronic Signature(s) Signed: 09/04/2017 4:32:17 PM By: Alejandro Mulling Entered By: Alejandro Mulling on 09/04/2017 11:22:30 Branam, Krithik A. ([id number]) -------------------------------------------------------------------------------- Multi Wound Chart Details Patient Name: [name], Salman A. Date of Service: 09/04/2017 10:30 AM Medical Record Number: [id number] Patient Account Number: [id number] Date of Birth/Sex: Jun 27, 1971 (46 y.o. Male) Treating RN: Phillis Haggis Primary Care Oline Belk: PATIENT, NO Other Clinician: Referring Sira Adsit: Minna Antis Treating Frandy Basnett/Extender: Maxwell Caul Weeks in Treatment: 0 Vital Signs Height(in): 71 Pulse(bpm): 75 Weight(lbs): 180 Blood Pressure(mmHg): 116/71 Body Mass Index(BMI): 25 Temperature(F): 98.0 Respiratory Rate 20 (breaths/min): Photos: [1:No Photos] [2:No Photos] [N/A:N/A] Wound Location: [1:Foot - Dorsal] [2:Left Foot - Circumfernential] [N/A:N/A] Wounding Event: [1:Gradually Appeared] [2:Gradually Appeared] [N/A:N/A] Primary Etiology: [1:Atypical] [2:Atypical] [N/A:N/A] Date  Acquired: [1:08/05/2017] [2:08/05/2017] [N/A:N/A] Weeks of Treatment: [1:0] [2:0] [N/A:N/A] Wound Status: [1:Open] [2:Open] [N/A:N/A] Pending Amputation on [1:Yes] [2:Yes] [N/A:N/A] Presentation: Measurements L x W x D [1:1.8x1.8x0.1] [2:10x28x0.1] [N/A:N/A] (cm) Area (cm) : [1:2.545] [2:219.911] [N/A:N/A] Volume (cm) : [1:0.254] [2:21.991] [N/A:N/A] Classification: [1:Partial Thickness] [2:Partial Thickness] [N/A:N/A] Exudate Amount: [1:Large] [2:Large] [N/A:N/A] Exudate Type: [1:Serous] [2:Serous] [N/A:N/A] Exudate Color: [1:amber] [2:amber] [N/A:N/A] Foul Odor After Cleansing: [1:Yes] [2:Yes] [N/A:N/A] Odor Anticipated Due to [1:No] [2:No] [N/A:N/A] Product Use: Wound Margin: [1:Distinct, outline attached] [2:Distinct, outline attached] [N/A:N/A] Granulation Amount: [1:Large (67-100%)] [2:Medium (34-66%)] [N/A:N/A] Granulation Quality: [1:Red] [2:Red] [N/A:N/A] Necrotic Amount: [1:Small (1-33%)] [2:Medium (34-66%)] [N/A:N/A] Exposed Structures: [1:Fascia: No Fat Layer (Subcutaneous Tissue) Exposed: No Tendon: No Muscle: No Joint: No Bone: No] [2:Fascia: No Fat Layer (Subcutaneous Tissue) Exposed: No Tendon: No Muscle: No Joint: No Bone: No] [N/A:N/A] Epithelialization: [1:None] [2:None] [N/A:N/A] Debridement: [1:N/A] [2:Open Wound/Selective ([id number]) - Selective] [N/A:N/A] Pre-procedure [1:N/A] [2:11:32] [N/A:N/A] Verification/Time Out Taken: Pain Control: [1:N/A] [2:Lidocaine 4% Topical Solution N/A] Tissue Debrided: N/A Necrotic/Eschar, N/A Fibrin/Slough, Exudates, Callus Level: N/A Non-Viable Tissue  N/A Debridement Area (sq cm): N/A 5 N/A Instrument: N/A Blade, Forceps N/A Specimen: N/A Swab N/A Number of Specimens N/A 1 N/A Taken: Bleeding: N/A Minimum N/A Hemostasis Achieved: N/A Pressure N/A Procedural Pain: N/A 0 N/A Post Procedural Pain: N/A 0 N/A Debridement Treatment N/A Procedure was tolerated well N/A Response: Post Debridement N/A 10x28x0.1  N/A Measurements L x W x D (cm) Post Debridement Volume: N/A 21.991 N/A (cm) Periwound Skin Texture: No Abnormalities Noted Excoriation: Yes N/A Induration: Yes Periwound Skin Moisture: Maceration: Yes Maceration: Yes N/A Periwound Skin Color: No Abnormalities Noted Erythema: Yes N/A Erythema Location: N/A Circumferential N/A Temperature: No Abnormality N/A N/A Tenderness on Palpation: Yes No N/A Wound Preparation: Ulcer Cleansing: Ulcer Cleansing: N/A Rinsed/Irrigated with Saline Rinsed/Irrigated with Saline Topical Anesthetic Applied: Topical Anesthetic Applied: Other: lidocaine 4% Other: lidocaine 4% Procedures Performed: N/A Debridement N/A Treatment Notes Electronic Signature(s) Signed: 09/08/2017 7:28:22 AM By: Baltazar Najjar MD Entered By: Baltazar Najjar on 09/04/2017 12:31:02 Laforte, Koji A. ([id number]) -------------------------------------------------------------------------------- Multi-Disciplinary Care Plan Details Patient Name: [name], Sloane A. Date of Service: 09/04/2017 10:30 AM Medical Record Number: [id number] Patient Account Number: [id number] Date of Birth/Sex: 1971-03-07 (46 y.o. Male) Treating RN: Ashok Cordia, Debi Primary Care Chrysa Rampy: PATIENT, NO Other Clinician: Referring Curley Hogen: Minna Antis Treating Indiya Izquierdo/Extender: Altamese  in Treatment: 0 Active Inactive ` Abuse / Safety / Falls / Self Care Management Nursing Diagnoses: Potential for falls Goals: Patient will not experience any injury related to falls Date Initiated: 09/04/2017 Target Resolution Date: 12/21/2017 Goal Status: Active Interventions: Assess impairment of mobility on admission and as needed per policy Assess personal safety and home safety (as indicated) on admission and as needed Assess self care needs on admission and as needed Provide education on basic hygiene Provide education on fall prevention Notes: ` Nutrition Nursing Diagnoses: Imbalanced  nutrition Potential for alteratiion in Nutrition/Potential for imbalanced nutrition Goals: Patient/caregiver agrees to and verbalizes understanding of need to use nutritional supplements and/or vitamins as prescribed Date Initiated: 09/04/2017 Target Resolution Date: 12/21/2017 Goal Status: Active Interventions: Assess patient nutrition upon admission and as needed per policy Notes: ` Orientation to the Wound Care Program Nursing Diagnoses: Knowledge deficit related to the wound healing center program Balint, Desiree A. ([id number]) Goals: Patient/caregiver will verbalize understanding of the Wound Healing Center Program Date Initiated: 09/04/2017 Target Resolution Date: 09/21/2017 Goal Status: Active Interventions: Provide education on orientation to the wound center Notes: ` Pain, Acute or Chronic Nursing Diagnoses: Pain, acute or chronic: actual or potential Potential alteration in comfort, pain Goals: Patient/caregiver will verbalize adequate pain control between visits Date Initiated: 09/04/2017 Target Resolution Date: 11/23/2017 Goal Status: Active Interventions: Complete pain assessment as per visit requirements Notes: ` Soft Tissue Infection Nursing Diagnoses: Impaired tissue integrity Knowledge deficit related to disease process and management Knowledge deficit related to home infection control: handwashing, handling of soiled dressings, supply storage Potential for infection: soft tissue Goals: Patient/caregiver will verbalize understanding of or measures to prevent infection and contamination in the home setting Date Initiated: 09/04/2017 Target Resolution Date: 11/23/2017 Goal Status: Active Signs and symptoms of infection will be recognized early to allow for prompt treatment Date Initiated: 09/04/2017 Target Resolution Date: 12/21/2017 Goal Status: Active Interventions: Assess signs and symptoms of infection every visit Provide education on  infection Notes: ` Wound/Skin Impairment Loch, Revin A. ([id number]) Nursing Diagnoses: Impaired tissue integrity Knowledge deficit related to smoking impact on wound healing Knowledge deficit related to ulceration/compromised skin integrity Goals: Ulcer/skin breakdown will have a volume reduction of  80% by week 12 Date Initiated: 09/04/2017 Target Resolution Date: 12/21/2017 Goal Status: Active Interventions: Assess patient/caregiver ability to perform ulcer/skin care regimen upon admission and as needed Assess ulceration(s) every visit Provide education on smoking Provide education on ulcer and skin care Notes: Electronic Signature(s) Signed: 09/04/2017 4:32:17 PM By: Alejandro MullingPinkerton, Debra Entered By: Alejandro MullingPinkerton, Debra on 09/04/2017 11:29:41 Mclamb, Dionne A. ([id number]) -------------------------------------------------------------------------------- Pain Assessment Details Patient Name: Marc DragonARR, Shaquille A. Date of Service: 09/04/2017 10:30 AM Medical Record Number: [id number] Patient Account Number: [id number] Date of Birth/Sex: 09/16/1971 [age] y.o. Male) Treating RN: Phillis HaggisPinkerton, Debi Primary Care Azilee Pirro: PATIENT, NO Other Clinician: Referring Divon Krabill: Minna AntisPADUCHOWSKI, KEVIN Treating Talaysha Freeberg/Extender: Maxwell CaulOBSON, MICHAEL G Weeks in Treatment: 0 Active Problems Location of Pain Severity and Description of Pain Patient Has Paino Yes Site Locations Pain Location: Pain in Ulcers Rate the pain. Current Pain Level: 5 Character of Pain Describe the Pain: Aching, Tender, Throbbing Pain Management and Medication Current Pain Management: Electronic Signature(s) Signed: 09/04/2017 4:32:17 PM By: Alejandro MullingPinkerton, Debra Entered By: Alejandro MullingPinkerton, Debra on 09/04/2017 10:43:48 Kalmbach, Terrion A. ([id number]) -------------------------------------------------------------------------------- Patient/Caregiver Education Details Patient Name: Marc DragonARR, Truett A. Date of Service: 09/04/2017 10:30 AM Medical Record Number:  [id number] Patient Account Number: [id number] Date of Birth/Gender: 06/14/1971 [age] y.o. Male) Treating RN: Phillis HaggisPinkerton, Debi Primary Care Physician: PATIENT, NO Other Clinician: Referring Physician: Minna AntisPADUCHOWSKI, KEVIN Treating Physician/Extender: Altamese CarolinaOBSON, MICHAEL G Weeks in Treatment: 0 Education Assessment Education Provided To: Patient Education Topics Provided Basic Hygiene: Methods: Explain/Verbal Responses: State content correctly Infection: Handouts: Infection Prevention and Management Methods: Explain/Verbal Responses: State content correctly Nutrition: Handouts: Nutrition Methods: Explain/Verbal Responses: State content correctly Safety: Handouts: Safety Methods: Explain/Verbal Responses: State content correctly Smoking and Wound Healing: Handouts: Smoking and Wound Healing Methods: Explain/Verbal Responses: State content correctly Welcome To The Wound [facility] Center: Handouts: Welcome To The [facility] Methods: Explain/Verbal Responses: State content correctly Wound/Skin Impairment: Handouts: Caring for Your Ulcer, Other: change dressing as ordered Methods: Demonstration, Explain/Verbal Responses: State content correctly Electronic Signature(s) Signed: 09/04/2017 4:32:17 PM By: Joya SalmPinkerton, Debra Kirkey, Ardean A. ([id number]) Entered By: Alejandro MullingPinkerton, Debra on 09/04/2017 12:35:52 Topel, Courtez A. ([id number]) -------------------------------------------------------------------------------- Wound Assessment Details Patient Name: Marc DragonARR, Ferris A. Date of Service: 09/04/2017 10:30 AM Medical Record Number: [id number] Patient Account Number: [id number] Date of Birth/Sex: 01/07/1971 [age] y.o. Male) Treating RN: Phillis HaggisPinkerton, Debi Primary Care Kassandra Meriweather: PATIENT, NO Other Clinician: Referring Raiden Yearwood: Minna AntisPADUCHOWSKI, KEVIN Treating Ann Groeneveld/Extender: Maxwell CaulOBSON, MICHAEL G Weeks in Treatment: 0 Wound Status Wound Number: 1 Primary Etiology: Atypical Wound Location: Foot - Dorsal Wound  Status: Open Wounding Event: Gradually Appeared Date Acquired: 08/05/2017 Weeks Of Treatment: 0 Clustered Wound: No Photos Photo Uploaded By: Alejandro MullingPinkerton, Debra on 09/04/2017 16:26:31 Wound Measurements Length: (cm) 1.8 Width: (cm) 1.8 Depth: (cm) 0.1 Area: (cm) 2.545 Volume: (cm) 0.254 % Reduction in Area: % Reduction in Volume: Epithelialization: None Tunneling: No Undermining: No Wound Description Classification: Partial Thickness Wound Margin: Distinct, outline attached Exudate Amount: Large Exudate Type: Serous Exudate Color: amber Foul Odor After Cleansing: Yes Due to Product Use: No Slough/Fibrino Yes Wound Bed Granulation Amount: Large (67-100%) Exposed Structure Granulation Quality: Red Fascia Exposed: No Necrotic Amount: Small (1-33%) Fat Layer (Subcutaneous Tissue) Exposed: No Necrotic Quality: Adherent Slough Tendon Exposed: No Muscle Exposed: No Joint Exposed: No Bone Exposed: No Periwound Skin Texture Blue, Truman A. ([id number]) Texture Color No Abnormalities Noted: No No Abnormalities Noted: No Moisture Temperature / Pain No Abnormalities Noted: No Temperature: No Abnormality Maceration: Yes Tenderness on Palpation: Yes Wound Preparation Ulcer Cleansing: Rinsed/Irrigated with Saline Topical Anesthetic  Applied: Other: lidocaine 4%, Electronic Signature(s) Signed: 09/04/2017 4:32:17 PM By: Alejandro Mulling Entered By: Alejandro Mulling on 09/04/2017 11:00:00 Tremaine, Amman A. ([id number]) -------------------------------------------------------------------------------- Wound Assessment Details Patient Name: [name], Kveon A. Date of Service: 09/04/2017 10:30 AM Medical Record Number: [id number] Patient Account Number: [id number] Date of Birth/Sex: February 25, 1971 (46 y.o. Male) Treating RN: Phillis Haggis Primary Care Ilyssa Grennan: PATIENT, NO Other Clinician: Referring Fred Hammes: Minna Antis Treating Laurey Salser/Extender: Maxwell Caul Weeks in  Treatment: 0 Wound Status Wound Number: 2 Primary Etiology: Atypical Wound Location: Left Foot - Circumfernential Wound Status: Open Wounding Event: Gradually Appeared Date Acquired: 08/05/2017 Weeks Of Treatment: 0 Clustered Wound: No Photos Photo Uploaded By: Alejandro Mulling on 09/04/2017 16:28:01 Wound Measurements Length: (cm) 10 Width: (cm) 28 Depth: (cm) 0.1 Area: (cm) 219.911 Volume: (cm) 21.991 % Reduction in Area: % Reduction in Volume: Epithelialization: None Tunneling: No Undermining: No Wound Description Classification: Partial Thickness Wound Margin: Distinct, outline attached Exudate Amount: Large Exudate Type: Serous Exudate Color: amber Foul Odor After Cleansing: Yes Due to Product Use: No Slough/Fibrino Yes Wound Bed Granulation Amount: Medium (34-66%) Exposed Structure Granulation Quality: Red Fascia Exposed: No Necrotic Amount: Medium (34-66%) Fat Layer (Subcutaneous Tissue) Exposed: No Necrotic Quality: Adherent Slough Tendon Exposed: No Muscle Exposed: No Joint Exposed: No Bone Exposed: No Periwound Skin Texture Hochmuth, Jlyn A. ([id number]) Texture Color No Abnormalities Noted: No No Abnormalities Noted: No Excoriation: Yes Erythema: Yes Induration: Yes Erythema Location: Circumferential Moisture No Abnormalities Noted: No Maceration: Yes Wound Preparation Ulcer Cleansing: Rinsed/Irrigated with Saline Topical Anesthetic Applied: Other: lidocaine 4%, Electronic Signature(s) Signed: 09/04/2017 4:32:17 PM By: Alejandro Mulling Entered By: Alejandro Mulling on 09/04/2017 11:05:15 Hanton, Wyndham A. ([id number]) -------------------------------------------------------------------------------- Vitals Details Patient Name: [name], Lannis A. Date of Service: 09/04/2017 10:30 AM Medical Record Number: [id number] Patient Account Number: [id number] Date of Birth/Sex: 1971/07/01 (46 y.o. Male) Treating RN: Phillis Haggis Primary Care Catalaya Garr:  PATIENT, NO Other Clinician: Referring Nardos Putnam: Minna Antis Treating Kayton Ripp/Extender: Maxwell Caul Weeks in Treatment: 0 Vital Signs Time Taken: 10:45 Temperature (F): 98.0 Height (in): 71 Pulse (bpm): 75 Source: Stated Respiratory Rate (breaths/min): 20 Weight (lbs): 180 Blood Pressure (mmHg): 116/71 Source: Measured Reference Range: 80 - 120 mg / dl Body Mass Index (BMI): 25.1 Electronic Signature(s) Signed: 09/04/2017 4:32:17 PM By: Alejandro Mulling Entered By: Alejandro Mulling on 09/04/2017 10:46:52

## 2017-09-08 NOTE — Progress Notes (Signed)
ANTERRIO, MCCLEERY ([id number]) Visit Report for 09/04/2017 Debridement Details Patient Name: [name], [name] Date of Service: 09/04/2017 10:30 AM Medical Record Number: [id number] Patient Account Number: [id number] Date of Birth/Sex: [birth year] (46 y.o. Male) Treating RN: Carolyne Fiscal, Debi Primary Care Provider: PATIENT, NO Other Clinician: Referring Provider: Harvest Dark Treating Provider/Extender: Ricard Dillon Weeks in Treatment: 0 Debridement Performed for Wound #2 Left,Circumferential Foot Assessment: Performed By: Physician Ricard Dillon, MD Debridement: Open Wound/Selective Debridement Description: Selective Pre-procedure Verification/Time Yes - 11:32 Out Taken: Start Time: 11:33 Pain Control: Lidocaine 4% Topical Solution Level: Non-Viable Tissue Total Area Debrided (L x W): 2.5 (cm) x 2 (cm) = 5 (cm) Tissue and other material Viable, Non-Viable, Callus, Eschar, Exudate, Fibrin/Slough debrided: Instrument: Blade, Forceps Specimen: Swab Number of Specimens Taken: 1 Bleeding: Minimum Hemostasis Achieved: Pressure End Time: 11:35 Procedural Pain: 0 Post Procedural Pain: 0 Response to Treatment: Procedure was tolerated well Post Debridement Measurements of Total Wound Length: (cm) 10 Width: (cm) 28 Depth: (cm) 0.1 Volume: (cm) 21.991 Character of Wound/Ulcer Post Debridement: Requires Further Debridement Post Procedure Diagnosis Same as Pre-procedure Electronic Signature(s) Signed: 09/04/2017 4:32:17 PM By: Alric Quan Signed: 09/08/2017 7:28:22 AM By: Linton Ham MD Entered By: Linton Ham on 09/04/2017 12:31:17 [place], Trimaine A. ([id number]) -------------------------------------------------------------------------------- HPI Details Patient Name: [name], [name] Date of Service: 09/04/2017 10:30 AM Medical Record Number: [id number] Patient Account Number: [id number] Date of Birth/Sex: [birth year] (46 y.o. Male) Treating RN: Ahmed Prima Primary Care Provider: PATIENT, NO Other Clinician: Referring Provider: Harvest Dark Treating Provider/Extender: Ricard Dillon Weeks in Treatment: 0 History of Present Illness HPI Description: pulse/19/19; this is a 46 year old man who was not a diabetic and has no known history of peripheral vascular disease. He does have hyperlipidemia and a history of cocaine abuse although he insists that this is not active. He tells me that about a month ago he started having denuded skin between the toes of his left foot and on the plantar aspect of his left foot. He was picking at the skin and pulling it off. More recently he has had involvement of the toes on the right foot. Week or 2 ago he started getting swelling and pain in the left foot and he was seen in the ER on 08/29/17 he was diagnosed with cellulitis. The only ulcer that they commented on was the left fifth toe. An x-ray suggested ulcer at the fifth MTP slightly osteopenic appearance of the fifth met head question early osteo. He has 14 days of Bactrim DS prescribed and the patient states the swelling and pain in the left foot is improved since starting this 6 days ago. ABIs in this clinic were 1.28 on the right and 1.3 on the left. The patient is a smoker at one half pack per day Electronic Signature(s) Signed: 09/08/2017 7:28:22 AM By: Linton Ham MD Entered By: Linton Ham on 09/04/2017 12:37:17 [name], [name] ([id number]) -------------------------------------------------------------------------------- Physical Exam Details Patient Name: [name], [name] Date of Service: 09/04/2017 10:30 AM Medical Record Number: [id number] Patient Account Number: [id number] Date of Birth/Sex: [birth year] (46 y.o. Male) Treating RN: Ahmed Prima Primary Care Provider: PATIENT, NO Other Clinician: Referring Provider: Harvest Dark Treating Provider/Extender: Ricard Dillon Weeks in Treatment: 0 Constitutional Sitting or  standing Blood Pressure is within target range for patient.. Pulse regular and within target range for patient.Marland Kitchen Respirations regular, non-labored and within target range.. Temperature is normal and within the target range for the patient.Marland Kitchen appears in no distress. Eyes Conjunctivae clear.  No discharge. Respiratory Respiratory effort is easy and symmetric bilaterally. Rate is normal at rest and on room air.. Bilateral breath sounds are clear and equal in all lobes with no wheezes, rales or rhonchi.. Cardiovascular Heart rhythm and rate regular, without murmur or gallop.. Femoral arteries without bruits and pulses strong.. Pedal pulses palpable and strong bilaterally.. Lymphatic none palpable in the popliteal or inguinal area. Integumentary (Hair, Skin) there is no rash. Psychiatric No evidence of depression, anxiety, or agitation. Calm, cooperative, and communicative. Appropriate interactions and affect.. Notes wound exam; the most substantial abnormality is on the left foot. He has completely full-thickness skin loss on the plantar aspect of his left foot down to the level of his metatarsal heads. There is macerated skin and erythema between the toes especially the fourth and fifth and third and fourth. Erythema and swelling extending into his forefoot although by his description this is a lot better. oOn the right side he has maceration and tissue opening between all of the toes beyond the first and second. This looks like severe tinea pedis. There is no infection here. Electronic Signature(s) Signed: 09/08/2017 7:28:22 AM By: Linton Ham MD Entered By: Linton Ham on 09/04/2017 12:39:43 [name], [name] ([id number]) -------------------------------------------------------------------------------- Physician Orders Details Patient Name: [name], [name] Date of Service: 09/04/2017 10:30 AM Medical Record Number: [id number] Patient Account Number: [id number] Date of Birth/Sex:  Jun 14, 1971 (46 y.o. Male) Treating RN: Ahmed Prima Primary Care Provider: PATIENT, NO Other Clinician: Referring Provider: Harvest Dark Treating Provider/Extender: Tito Dine in Treatment: 0 Verbal / Phone Orders: Yes Clinician: Pinkerton, Debi Read Back and Verified: Yes Diagnosis Coding Wound Cleansing Wound #1 Dorsal Foot o Clean wound with Normal Saline. o Cleanse wound with mild soap and water o May Shower, gently pat wound dry prior to applying new dressing. Wound #2 Left,Circumferential Foot o Clean wound with Normal Saline. o Cleanse wound with mild soap and water o May Shower, gently pat wound dry prior to applying new dressing. Anesthetic (add to Medication List) Wound #1 Dorsal Foot o Topical Lidocaine 4% cream applied to wound bed prior to debridement (In Clinic Only). Wound #2 Left,Circumferential Foot o Topical Lidocaine 4% cream applied to wound bed prior to debridement (In Clinic Only). Skin Barriers/Peri-Wound Care Wound #1 Dorsal Foot o Antifungal cream Wound #2 Left,Circumferential Foot o Antifungal cream Primary Wound Dressing Wound #1 Dorsal Foot o Silvercel Non-Adherent Wound #2 Left,Circumferential Foot o Silvercel Non-Adherent Secondary Dressing Wound #1 Dorsal Foot o ABD pad o Dry Gauze o Conform/Kerlix Wound #2 Left,Circumferential Foot o ABD pad o Dry Gauze o Conform/Kerlix [name], [name] ([id number]) Dressing Change Frequency Wound #1 Dorsal Foot o Change dressing every day. Wound #2 Left,Circumferential Foot o Change dressing every day. Follow-up Appointments o Return Appointment in 1 week. Edema Control Wound #1 Dorsal Foot o Elevate legs to the level of the heart and pump ankles as often as possible Wound #2 Left,Circumferential Foot o Elevate legs to the level of the heart and pump ankles as often as possible Additional Orders / Instructions Wound #1 Dorsal  Foot o Vitamin A; Vitamin C, Zinc o Increase protein intake. Wound #2 Left,Circumferential Foot o Vitamin A; Vitamin C, Zinc o Increase protein intake. Laboratory o Bacteria identified in Wound by Culture (MICRO) - left foot oooo LOINC Code: 0865-7 QION Convenience Name: Wound culture routine Patient Medications Allergies: NKDA Notifications Medication Indication Start End lidocaine DOSE 1 - topical 4 % cream - 1 cream topical Electronic Signature(s) Signed: 09/04/2017  4:32:17 PM By: Alric Quan Signed: 09/08/2017 7:28:22 AM By: Linton Ham MD Previous Signature: 09/04/2017 11:58:13 AM Version By: Linton Ham MD Entered By: Alric Quan on 09/04/2017 12:11:09 [name], Marc Lake. ([id number]) -------------------------------------------------------------------------------- Prescription 09/04/2017 Patient Name: [name], Ulises A. Provider: Ricard Dillon MD Date of Birth: [birth year] NPI#: [id number] Sex: Jerilynn Mages DEA#: [id number] Phone #: [phone] License #: [id number] Patient Address: [facility] [place], [place] [address] [address] [place], [place] [address] [phone] Allergies NKDA Medication Medication: Route: Strength: Form: lidocaine 4 % topical cream topical 4% cream Class: TOPICAL LOCAL ANESTHETICS Dose: Frequency / Time: Indication: 1 1 cream topical Number of Refills: Number of Units: 0 Generic Substitution: Start Date: End Date: One Time Use: Substitution Permitted No Note to Pharmacy: Signature(s): Date(s): Electronic Signature(s) Signed: 09/04/2017 4:32:17 PM By: Alric Quan Signed: 09/08/2017 7:28:22 AM By: Linton Ham MD Entered By: Alric Quan on 09/04/2017 12:11:10 [name], [name] ([id number]) --------------------------------------------------------------------------------  Problem List Details Patient Name: [name],  Keita A. Date of Service: 09/04/2017 10:30 AM Medical Record Number: [id number] Patient Account Number: [id number] Date of Birth/Sex: 03-15-1971 (46 y.o. 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 ([id number]) -------------------------------------------------------------------------------- Progress Note Details Patient Name: [name], [name] Date of Service: 09/04/2017 10:30 AM Medical Record Number: [id number] Patient Account Number: [id number] Date of Birth/Sex: 12/25/1970 (46 y.o. Male) Treating RN: Ahmed Prima Primary Care Provider: PATIENT, NO Other Clinician: Referring Provider: Harvest Dark Treating Provider/Extender: Ricard Dillon Weeks in Treatment: 0 Subjective History of Present Illness (HPI) pulse/19/19; this is a 46 year old man who was not a diabetic and has no known history of peripheral vascular disease. He does have hyperlipidemia and a history of cocaine abuse although he insists that this is not active. He tells me that about a month ago he started having denuded skin between the toes of his left foot and on the plantar aspect of his left foot. He was picking at the skin and pulling it off. More recently he has had involvement of the toes on the right foot. Week or 2 ago he started getting swelling and pain in the left foot and he was seen in the ER on 08/29/17 he was  diagnosed with cellulitis. The only ulcer that they commented on was the left fifth toe. An x-ray suggested ulcer at the fifth MTP slightly osteopenic appearance of the fifth met head question early osteo. He has 14 days of Bactrim DS prescribed and the patient states the swelling and pain in the left foot is improved since starting this 6 days ago. ABIs in this clinic were 1.28 on the right and 1.3 on the left. The patient is a smoker at one half pack per day Wound History Patient presents with 1 open wound that has been present for approximately 1 month. Patient has been treating wound in the following manner: gauze. Laboratory tests have not been performed in the last month. Patient reportedly has not tested positive for an antibiotic resistant organism.  Patient reportedly has not tested positive for osteomyelitis. Patient reportedly has not had testing performed to evaluate circulation in the legs. Patient experiences the following problems associated with their wounds: swelling. Patient History Information obtained from Patient. Allergies NKDA Family History Cancer - Father, Heart Disease - Mother,Maternal Grandparents, Hypertension - Mother, Thyroid Problems - Siblings, No family history of Diabetes, Hereditary Spherocytosis, Kidney Disease, Lung Disease, Seizures, Stroke, Tuberculosis. Social History Current every day smoker - 0.5 pack day, Marital Status - Single, Alcohol Use - Moderate, Drug Use - No History, Caffeine Use - Daily. Review of Systems (ROS) Constitutional Symptoms (General Health) The patient has no complaints or symptoms. Eyes The patient has no complaints or symptoms. Ear/Nose/Mouth/Throat The patient has no complaints or symptoms. Hematologic/Lymphatic The patient has no complaints or symptoms. Respiratory The patient has no complaints or symptoms. Cardiovascular Rock, Kalup A. ([id number]) hyperlipidemia Gastrointestinal The patient has no complaints or  symptoms. Endocrine The patient has no complaints or symptoms. Genitourinary The patient has no complaints or symptoms. Immunological The patient has no complaints or symptoms. Integumentary (Skin) The patient has no complaints or symptoms. Musculoskeletal The patient has no complaints or symptoms. Neurologic The patient has no complaints or symptoms. Oncologic The patient has no complaints or symptoms. Psychiatric The patient has no complaints or symptoms. Objective Constitutional Sitting or standing Blood Pressure is within target range for patient.. Pulse regular and within target range for patient.Marland Kitchen Respirations regular, non-labored and within target range.. Temperature is normal and within the target range for the patient.Marland Kitchen appears in no distress. Vitals Time Taken: 10:45 AM, Height: 71 in, Source: Stated, Weight: 180 lbs, Source: Measured, BMI: 25.1, Temperature: 98.0 F, Pulse: 75 bpm, Respiratory Rate: 20 breaths/min, Blood Pressure: 116/71 mmHg. Eyes Conjunctivae clear. No discharge. Respiratory Respiratory effort is easy and symmetric bilaterally. Rate is normal at rest and on room air.. Bilateral breath sounds are clear and equal in all lobes with no wheezes, rales or rhonchi.. Cardiovascular Heart rhythm and rate regular, without murmur or gallop.. Femoral arteries without bruits and pulses strong.. Pedal pulses palpable and strong bilaterally.. Lymphatic none palpable in the popliteal or inguinal area. Psychiatric No evidence of depression, anxiety, or agitation. Calm, cooperative, and communicative. Appropriate interactions and affect.. General Notes: wound exam; the most substantial abnormality is on the left foot. He has completely full-thickness skin loss on the plantar aspect of his left foot down to the level of his metatarsal heads. There is macerated skin and erythema between the toes especially the fourth and fifth and third and fourth. Erythema and  swelling extending into his forefoot although by his [name], [name] ([id number]) description this is a lot better. On the right side he has maceration and tissue opening between all of the toes beyond the first and second. This looks like severe tinea pedis. There is no infection here. Integumentary (Hair, Skin) there is no rash. Wound #1 status is Open. Original cause of wound was Gradually Appeared. The wound is located on the Dorsal Foot. The wound measures 1.8cm length x 1.8cm width x 0.1cm depth; 2.545cm^2 area and 0.254cm^3 volume. There is no tunneling or undermining noted. There is a large amount of serous drainage noted. Foul odor after cleansing was noted. The wound margin is distinct with the outline attached to the wound base. There is large (67-100%) red granulation within the wound bed. There is a small (1-33%) amount of necrotic tissue within the wound bed including Adherent Slough. The periwound skin appearance exhibited: Maceration. Periwound  temperature was noted as No Abnormality. The periwound has tenderness on palpation. Wound #2 status is Open. Original cause of wound was Gradually Appeared. The wound is located on the Left,Circumferential Foot. The wound measures 10cm length x 28cm width x 0.1cm depth; 219.911cm^2 area and 21.991cm^3 volume. There is no tunneling or undermining noted. There is a large amount of serous drainage noted. Foul odor after cleansing was noted. The wound margin is distinct with the outline attached to the wound base. There is medium (34-66%) red granulation within the wound bed. There is a medium (34-66%) amount of necrotic tissue within the wound bed including Adherent Slough. The periwound skin appearance exhibited: Excoriation, Induration, Maceration, Erythema. The surrounding wound skin color is noted with erythema which is circumferential. Assessment Active Problems ICD-10 L97.522 - Non-pressure chronic ulcer of other part of left foot with  fat layer exposed L03.116 - Cellulitis of left lower limb B35.3 - Tinea pedis Procedures Wound #2 Pre-procedure diagnosis of Wound #2 is an Atypical located on the Left,Circumferential Foot . There was a Non-Viable Tissue Open Wound/Selective [phone]) debridement with total area of 5 sq cm performed by Ricard Dillon, MD. with the following instrument(s): Blade and Forceps to remove Viable and Non-Viable tissue/material including Exudate, Fibrin/Slough, Eschar, and Callus after achieving pain control using Lidocaine 4% Topical Solution. 1 Specimen was taken by a Swab and sent to the lab per facility protocol.A time out was conducted at 11:32, prior to the start of the procedure. A Minimum amount of bleeding was controlled with Pressure. The procedure was tolerated well with a pain level of 0 throughout and a pain level of 0 following the procedure. Post Debridement Measurements: 10cm length x 28cm width x 0.1cm depth; 21.991cm^3 volume. Character of Wound/Ulcer Post Debridement requires further debridement. Post procedure Diagnosis Wound #2: Same as Pre-Procedure [name], [name] ([id number]) using pickups and a scalpel I removed large amounts of denuded skin and from around the open areas on the left foot. Plan Wound Cleansing: Wound #1 Dorsal Foot: Clean wound with Normal Saline. Cleanse wound with mild soap and water May Shower, gently pat wound dry prior to applying new dressing. Wound #2 Left,Circumferential Foot: Clean wound with Normal Saline. Cleanse wound with mild soap and water May Shower, gently pat wound dry prior to applying new dressing. Anesthetic (add to Medication List): Wound #1 Dorsal Foot: Topical Lidocaine 4% cream applied to wound bed prior to debridement (In Clinic Only). Wound #2 Left,Circumferential Foot: Topical Lidocaine 4% cream applied to wound bed prior to debridement (In Clinic Only). Skin Barriers/Peri-Wound Care: Wound #1 Dorsal  Foot: Antifungal cream Wound #2 Left,Circumferential Foot: Antifungal cream Primary Wound Dressing: Wound #1 Dorsal Foot: Silvercel Non-Adherent Wound #2 Left,Circumferential Foot: Silvercel Non-Adherent Secondary Dressing: Wound #1 Dorsal Foot: ABD pad Dry Gauze Conform/Kerlix Wound #2 Left,Circumferential Foot: ABD pad Dry Gauze Conform/Kerlix Dressing Change Frequency: Wound #1 Dorsal Foot: Change dressing every day. Wound #2 Left,Circumferential Foot: Change dressing every day. Follow-up Appointments: Return Appointment in 1 week. Edema Control: Wound #1 Dorsal Foot: Elevate legs to the level of the heart and pump ankles as often as possible Wound #2 Left,Circumferential Foot: Elevate legs to the level of the heart and pump ankles as often as possible Additional Orders / Instructions: Wound #1 Dorsal Foot: Vitamin A; Vitamin C, Zinc Increase protein intake. Wound #2 Left,Circumferential Foot: Vitamin A; Vitamin C, Zinc Increase protein intake. Laboratory ordered were: Wound culture routine - left foot Pichon, Tymeer A. ([id number]) The following medication(s)  was prescribed: lidocaine topical 4 % cream 1 1 cream topical #1 full-thickness skin loss in the plantar aspect of his foot with erythema and drainage between his toes. I suspect this patient probably had tinea pedis which she has a history of that became secondarily infected. Degree of tissue loss on the plantar aspect of his foot would make me concerned about staph aureus. The only other thing in the differential diagnosis was the fact that he was using a cleaner in his shower 2 months ago and the left foot almost looks like a chemical burn injury however there is a clear separation in time between that event and when the tissue started to macerate. #2 on the right foot this looks more like classic severe tinea pedis again the patient has a history of this #3 I have continued the patient on Septra DS as he seems  to be making some improvement.a culture under the skin that I have removed was done #4 have recommended Tinactin cream to the toes on his foot #5 terbenifine 250 mg a day for 2 weeks #6 topical antifungal cream covered by silver alginate which we have provided The patient does not have insurance we have provided him dressing material. I've written him a note to get him out of work. He works at [employer] with work boots on there is no way that we can risk further damage to the left foot Electronic Signature(s) Signed: 09/08/2017 7:28:22 AM By: Linton Ham MD Entered By: Linton Ham on 09/04/2017 12:43:56 Ozturk, Brodie A. ([id number]) -------------------------------------------------------------------------------- ROS/PFSH Details Patient Name: [name], Xane A. Date of Service: 09/04/2017 10:30 AM Medical Record Number: [id number] Patient Account Number: [id number] Date of Birth/Sex: Sep 16, 1971 (46 y.o. Male) Treating RN: Ahmed Prima Primary Care Provider: PATIENT, NO Other Clinician: Referring Provider: Harvest Dark Treating Provider/Extender: Ricard Dillon Weeks in Treatment: 0 Information Obtained From Patient Wound History Do you currently have one or more open woundso Yes How many open wounds do you currently haveo 1 Approximately how long have you had your woundso 1 month How have you been treating your wound(s) until nowo gauze Has your wound(s) ever healed and then re-openedo No Have you had any lab work done in the past montho No Have you tested positive for an antibiotic resistant organism (MRSA, VRE)o No Have you tested positive for osteomyelitis (bone infection)o No Have you had any tests for circulation on your legso No Have you had other problems associated with your woundso Swelling Constitutional Symptoms (General Health) Complaints and Symptoms: No Complaints or Symptoms Eyes Complaints and Symptoms: No Complaints or  Symptoms Ear/Nose/Mouth/Throat Complaints and Symptoms: No Complaints or Symptoms Hematologic/Lymphatic Complaints and Symptoms: No Complaints or Symptoms Respiratory Complaints and Symptoms: No Complaints or Symptoms Cardiovascular Complaints and Symptoms: Review of System Notes: hyperlipidemia Gastrointestinal Complaints and Symptoms: No Complaints or Symptoms Alix, Vittorio A. ([id number]) Endocrine Complaints and Symptoms: No Complaints or Symptoms Genitourinary Complaints and Symptoms: No Complaints or Symptoms Immunological Complaints and Symptoms: No Complaints or Symptoms Integumentary (Skin) Complaints and Symptoms: No Complaints or Symptoms Musculoskeletal Complaints and Symptoms: No Complaints or Symptoms Neurologic Complaints and Symptoms: No Complaints or Symptoms Oncologic Complaints and Symptoms: No Complaints or Symptoms Psychiatric Complaints and Symptoms: No Complaints or Symptoms Immunizations Pneumococcal Vaccine: Received Pneumococcal Vaccination: No Implantable Devices Family and Social History Cancer: Yes - Father; Diabetes: No; Heart Disease: Yes - Mother,Maternal Grandparents; Hereditary Spherocytosis: No; Hypertension: Yes - Mother; Kidney Disease: No; Lung Disease: No; Seizures: No; Stroke: No; Thyroid Problems: Yes -  Siblings; Tuberculosis: No; Current every day smoker - 0.5 pack day; Marital Status - Single; Alcohol Use: Moderate; Drug Use: No History; Caffeine Use: Daily; Financial Concerns: No; Food, Clothing or Shelter Needs: No; Support System Lacking: No; Transportation Concerns: No; Advanced Directives: No; Patient does not want information on Advanced Directives; Do not resuscitate: No; Living Will: No; Medical Power of Attorney: No Electronic Signature(s) Signed: 09/04/2017 4:32:17 PM By: Alric Quan Signed: 09/08/2017 7:28:22 AM By: Linton Ham MD Silas, Ripley A. ([id number]) Entered By: Alric Quan on 09/04/2017  10:52:03 Norling, Dustan A. ([id number]) -------------------------------------------------------------------------------- SuperBill Details Patient Name: [name], Kongmeng A. Date of Service: 09/04/2017 Medical Record Number: [id number] Patient Account Number: [id number] Date of Birth/Sex: 09/14/1971 (46 y.o. Male) Treating RN: Ahmed Prima Primary Care Provider: PATIENT, NO Other Clinician: Referring Provider: Harvest Dark Treating Provider/Extender: Ricard Dillon Weeks in Treatment: 0 Diagnosis Coding ICD-10 Codes Code Description [phone] Non-pressure chronic ulcer of other part of left foot with fat layer exposed L03.116 Cellulitis of left lower limb B35.3 Tinea pedis Facility Procedures CPT4 Code: 50569794 Description: 80165 - WOUND CARE VISIT-LEV 3 EST PT Modifier: Quantity: 1 CPT4 Code: 53748270 Description: 78675 - DEBRIDE WOUND 1ST 20 SQ CM OR < ICD-10 Diagnosis Description L97.522 Non-pressure chronic ulcer of other part of left foot with fat Modifier: layer exposed Quantity: 1 Physician Procedures CPT4 Code: 4492010 Description: 07121 - WC PHYS LEVEL 4 - NEW PT ICD-10 Diagnosis Description L97.522 Non-pressure chronic ulcer of other part of left foot with fat L03.116 Cellulitis of left lower limb B35.3 Tinea pedis Modifier: 25 layer exposed Quantity: 1 CPT4 Code: 9758832 Description: 54982 - WC PHYS DEBR WO ANESTH 20 SQ CM ICD-10 Diagnosis Description L97.522 Non-pressure chronic ulcer of other part of left foot with fat Modifier: layer exposed Quantity: 1 Electronic Signature(s) Signed: 09/04/2017 2:37:20 PM By: Alric Quan Signed: 09/08/2017 7:28:22 AM By: Linton Ham MD Entered By: Alric Quan on 09/04/2017 14:37:20

## 2017-09-09 LAB — AEROBIC CULTURE W GRAM STAIN (SUPERFICIAL SPECIMEN)

## 2017-09-09 LAB — AEROBIC CULTURE  (SUPERFICIAL SPECIMEN)

## 2017-09-11 ENCOUNTER — Encounter: Payer: No Typology Code available for payment source | Admitting: Internal Medicine

## 2017-09-11 NOTE — Progress Notes (Signed)
Marc [name], Marc [name] ([id number]) Visit Report for 09/11/2017 HPI Details Patient Name: Marc [name], Marc [name] Date of Service: 09/11/2017 3:30 PM Medical Record Number: [id number] Patient Account Number: [id number] Date of Birth/Sex: [birth year] (46 y.o. Male) Treating RN: Ahmed Prima Primary Care Provider: PATIENT, NO Other Clinician: Referring Provider: Harvest Dark Treating Provider/Extender: Ricard Dillon Weeks in Treatment: 1 History of Present Illness HPI Description: 09/04/17; this is [name] 46 year old man who was not [name] diabetic and has no known history of peripheral vascular disease. He does have hyperlipidemia and [name] history of cocaine abuse although he insists that this is not active. He tells me that about [name] month ago he started having denuded skin between the toes of his left foot and on the plantar aspect of his left foot. He was picking at the skin and pulling it off. More recently he has had involvement of the toes on the right foot. Week or 2 ago he started getting swelling and pain in the left foot and he was seen in the ER on 08/29/17 he was diagnosed with cellulitis. The only ulcer that they commented on was the left fifth toe. An x-ray suggested ulcer at the fifth MTP slightly osteopenic appearance of the fifth met head question early osteo. He has 14 days of Bactrim DS prescribed and the patient states the swelling and pain in the left foot is improved since starting this 6 days ago. ABIs in this clinic were 1.28 on the right and 1.3 on the left. The patient is [name] smoker at one half pack per day 09/11/17; patient came last week and in general I think he had severe tinea pedis with secondary bacterial cellulitis. He was on Septra DS and is completed this. I gave him oral terbinafine for the tinea pedis. [name] culture I did on the left foot last week grew Pseudomonas which is quinolone sensitive. I'll give him 10 days of ciprofloxacin today. However he arrives with his foot looking [name]  lot better. There is less erythema and less warmth Electronic Signature(s) Signed: 09/11/2017 4:35:05 PM By: Linton Ham MD Entered By: Linton Ham on 09/11/2017 16:00:40 [name], Marc [name] ([id number]) -------------------------------------------------------------------------------- Physical Exam Details Patient Name: Marc [name], Marc [name] Date of Service: 09/11/2017 3:30 PM Medical Record Number: [id number] Patient Account Number: [id number] Date of Birth/Sex: Jan 21, 1971 (46 y.o. Male) Treating RN: Ahmed Prima Primary Care Provider: PATIENT, NO Other Clinician: Referring Provider: Harvest Dark Treating Provider/Extender: Ricard Dillon Weeks in Treatment: 1 Constitutional Sitting or standing Blood Pressure is within target range for patient.. Pulse regular and within target range for patient.Marland Kitchen Respirations regular, non-labored and within target range.. Temperature is normal and within the target range for the patient.Marland Kitchen appears in no distress. Eyes Conjunctivae clear. No discharge. Respiratory Respiratory effort is easy and symmetric bilaterally. Rate is normal at rest and on room air.. Cardiovascular Pedal pulses palpable and strong bilaterally.. Lymphatic None palpable in the popliteal or inguinal area. Integumentary (Hair, Skin) No rashes seen. Psychiatric No evidence of depression, anxiety, or agitation. Calm, cooperative, and communicative. Appropriate interactions and affect.. Notes Wound exam; the patient's left foot looks entirely less angry. He has full thickness skin loss on the plantar aspect of his foot macerated skin and erythema between the toes. Similar changes on the right but limited to between his toes. I think all of this was tinea pedis which was severe with secondary cellulitis. Electronic Signature(s) Signed: 09/11/2017 4:35:05 PM By: Linton Ham MD Entered By: Linton Ham on 09/11/2017 16:02:41 [name], Marc [name]  (  [id number]) -------------------------------------------------------------------------------- Physician Orders Details Patient Name: Marc [name], Marc [name] Date of Service: 09/11/2017 3:30 PM Medical Record Number: [id number] Patient Account Number: [id number] Date of Birth/Sex: 1970/10/13 (46 y.o. Male) Treating RN: Ahmed Prima Primary Care Provider: PATIENT, NO Other Clinician: Referring Provider: Harvest Dark Treating Provider/Extender: Tito Dine in Treatment: 1 Verbal / Phone Orders: Yes Clinician: Pinkerton, Debi Read Back and Verified: Yes Diagnosis Coding Wound Cleansing Wound #1 Plantar Foot o Clean wound with Normal Saline. o Cleanse wound with mild soap and water o May Shower, gently pat wound dry prior to applying new dressing. Wound #2 Left,Circumferential Foot o Clean wound with Normal Saline. o Cleanse wound with mild soap and water o May Shower, gently pat wound dry prior to applying new dressing. Anesthetic (add to Medication List) Wound #1 Plantar Foot o Topical Lidocaine 4% cream applied to wound bed prior to debridement (In Clinic Only). Wound #2 Left,Circumferential Foot o Topical Lidocaine 4% cream applied to wound bed prior to debridement (In Clinic Only). Skin Barriers/Peri-Wound Care Wound #1 Plantar Foot o Antifungal cream Wound #2 Left,Circumferential Foot o Antifungal cream Primary Wound Dressing Wound #1 Plantar Foot o Silvercel Non-Adherent Wound #2 Left,Circumferential Foot o Silvercel Non-Adherent Secondary Dressing Wound #1 Plantar Foot o ABD pad o Dry Gauze o Conform/Kerlix Wound #2 Left,Circumferential Foot o ABD pad o Dry Gauze o Conform/Kerlix Marc [name], Marc [name] ([id number]) Dressing Change Frequency Wound #1 Plantar Foot o Change dressing every day. Wound #2 Left,Circumferential Foot o Change dressing every day. Follow-up Appointments o Return Appointment in 1 week. Edema  Control Wound #1 Plantar Foot o Elevate legs to the level of the heart and pump ankles as often as possible Wound #2 Left,Circumferential Foot o Elevate legs to the level of the heart and pump ankles as often as possible Additional Orders / Instructions Wound #1 Plantar Foot o Vitamin [name]; Vitamin C, Zinc o Increase protein intake. Wound #2 Left,Circumferential Foot o Vitamin [name]; Vitamin C, Zinc o Increase protein intake. Patient Medications Allergies: NKDA Notifications Medication Indication Start End lidocaine DOSE 1 - topical 4 % cream - 1 cream topical Electronic Signature(s) Signed: 09/11/2017 4:35:05 PM By: Linton Ham MD Signed: 09/11/2017 4:38:32 PM By: Alric Quan Previous Signature: 09/11/2017 3:46:33 PM Version By: Linton Ham MD Entered By: Alric Quan on 09/11/2017 16:01:51 Marc [name], Marc [name] ([id number]) -------------------------------------------------------------------------------- Prescription 09/11/2017 Patient Name: Marc [name], Solmon [name] Provider: Ricard Dillon MD Date of Birth: 01-29-1971 NPI#: [id number] Sex: Jerilynn Mages Marc [name]#: [id number] Phone #: [phone] License #: [id number] Patient Address: [facility] [place], [place] [address] [address] [place], [place] [address] [phone] Allergies NKDA Medication Medication: Route: Strength: Form: lidocaine 4 % topical cream topical 4% cream Class: TOPICAL LOCAL ANESTHETICS Dose: Frequency / Time: Indication: 1 1 cream topical Number of Refills: Number of Units: 0 Generic Substitution: Start Date: End Date: One Time Use: Substitution Permitted No Note to Pharmacy: Signature(s): Date(s): Electronic Signature(s) Signed: 09/11/2017 4:35:05 PM By: Linton Ham MD Signed: 09/11/2017 4:38:32 PM By: Alric Quan Entered By: Alric Quan on 09/11/2017 16:01:51 [place], [place].  ([id number]) --------------------------------------------------------------------------------  Problem List Details Patient Name: Marc [name], Kiel [name] Date of Service: 09/11/2017 3:30 PM Medical Record Number: [id number] Patient Account Number: [id number] Date of Birth/Sex: 1971-01-09 (46 y.o. Male) Treating RN: Ahmed Prima Primary Care Provider: PATIENT, NO Other Clinician: Referring Provider: Harvest Dark Treating Provider/Extender: Ricard Dillon Weeks in Treatment: 1 Active Problems ICD-10 Encounter Code  Description Active Date Diagnosis L97.522 Non-pressure chronic ulcer of other part of left foot with fat layer 09/04/2017 Yes exposed L03.116 Cellulitis of left lower limb 09/04/2017 Yes B35.3 Tinea pedis 09/04/2017 Yes Inactive Problems Resolved Problems Electronic Signature(s) Signed: 09/11/2017 4:35:05 PM By: Linton Ham MD Entered By: Linton Ham on 09/11/2017 15:56:10 Marc [name], Marc [name] ([id number]) -------------------------------------------------------------------------------- Progress Note Details Patient Name: Marc [name], Marc [name] Date of Service: 09/11/2017 3:30 PM Medical Record Number: [id number] Patient Account Number: [id number] Date of Birth/Sex: 03-19-1971 (46 y.o. Male) Treating RN: Ahmed Prima Primary Care Provider: PATIENT, NO Other Clinician: Referring Provider: Harvest Dark Treating Provider/Extender: Ricard Dillon Weeks in Treatment: 1 Subjective History of Present Illness (HPI) 09/04/17; this is [name] 46 year old man who was not [name] diabetic and has no known history of peripheral vascular disease. He does have hyperlipidemia and [name] history of cocaine abuse although he insists that this is not active. He tells me that about [name] month ago he started having denuded skin between the toes of his left foot and on the plantar aspect of his left foot. He was picking at the skin and pulling it off. More recently he has had involvement of the toes on the  right foot. Week or 2 ago he started getting swelling and pain in the left foot and he was seen in the ER on 08/29/17 he was diagnosed with cellulitis. The only ulcer that they commented on was the left fifth toe. An x-ray suggested ulcer at the fifth MTP slightly osteopenic appearance of the fifth met head question early osteo. He has 14 days of Bactrim DS prescribed and the patient states the swelling and pain in the left foot is improved since starting this 6 days ago. ABIs in this clinic were 1.28 on the right and 1.3 on the left. The patient is [name] smoker at one half pack per day 09/11/17; patient came last week and in general I think he had severe tinea pedis with secondary bacterial cellulitis. He was on Septra DS and is completed this. I gave him oral terbinafine for the tinea pedis. [name] culture I did on the left foot last week grew Pseudomonas which is quinolone sensitive. I'll give him 10 days of ciprofloxacin today. However he arrives with his foot looking [name] lot better. There is less erythema and less warmth Objective Constitutional Sitting or standing Blood Pressure is within target range for patient.. Pulse regular and within target range for patient.Marland Kitchen Respirations regular, non-labored and within target range.. Temperature is normal and within the target range for the patient.Marland Kitchen appears in no distress. Vitals Time Taken: 3:19 PM, Height: 71 in, Weight: 180 lbs, BMI: 25.1, Temperature: 98.2 F, Pulse: 70 bpm, Respiratory Rate: 20 breaths/min, Blood Pressure: 113/79 mmHg. Eyes Conjunctivae clear. No discharge. Respiratory Respiratory effort is easy and symmetric bilaterally. Rate is normal at rest and on room air.. Cardiovascular Pedal pulses palpable and strong bilaterally.. Lymphatic None palpable in the popliteal or inguinal area. Marc [name], Marc [name] ([id number]) Psychiatric No evidence of depression, anxiety, or agitation. Calm, cooperative, and communicative. Appropriate interactions  and affect.. General Notes: Wound exam; the patient's left foot looks entirely less angry. He has full thickness skin loss on the plantar aspect of his foot macerated skin and erythema between the toes. Similar changes on the right but limited to between his toes. I think all of this was tinea pedis which was severe with secondary cellulitis. Integumentary (Hair, Skin) No rashes seen. Wound #1 status is Open. Original cause of wound  was Gradually Appeared. The wound is located on the Plantar Foot. The wound measures 1.8cm length x 1.8cm width x 0.1cm depth; 2.545cm^2 area and 0.254cm^3 volume. There is no tunneling or undermining noted. There is [name] large amount of serous drainage noted. Foul odor after cleansing was noted. The wound margin is distinct with the outline attached to the wound base. There is large (67-100%) red granulation within the wound bed. There is [name] small (1-33%) amount of necrotic tissue within the wound bed including Adherent Slough. The periwound skin appearance exhibited: Dry/Scaly, Maceration. Periwound temperature was noted as No Abnormality. The periwound has tenderness on palpation. Wound #2 status is Open. Original cause of wound was Gradually Appeared. The wound is located on the Left,Circumferential Foot. The wound measures 5cm length x 25cm width x 0.1cm depth; 98.175cm^2 area and 9.817cm^3 volume. There is no tunneling or undermining noted. There is [name] large amount of serous drainage noted. Foul odor after cleansing was noted. The wound margin is distinct with the outline attached to the wound base. There is medium (34-66%) red granulation within the wound bed. There is [name] medium (34-66%) amount of necrotic tissue within the wound bed including Adherent Slough. The periwound skin appearance exhibited: Excoriation, Induration, Dry/Scaly, Maceration, Erythema. The surrounding wound skin color is noted with erythema which is circumferential. Periwound temperature was  noted as No Abnormality. The periwound has tenderness on palpation. Assessment Active Problems ICD-10 L97.522 - Non-pressure chronic ulcer of other part of left foot with fat layer exposed L03.116 - Cellulitis of left lower limb B35.3 - Tinea pedis Plan Wound Cleansing: Wound #1 Plantar Foot: Clean wound with Normal Saline. Cleanse wound with mild soap and water May Shower, gently pat wound dry prior to applying new dressing. Wound #2 Left,Circumferential Foot: Clean wound with Normal Saline. Cleanse wound with mild soap and water May Shower, gently pat wound dry prior to applying new dressing. Anesthetic (add to Medication List): Wound #1 Plantar Foot: Marc [name], Marc [name] ([id number]) Topical Lidocaine 4% cream applied to wound bed prior to debridement (In Clinic Only). Wound #2 Left,Circumferential Foot: Topical Lidocaine 4% cream applied to wound bed prior to debridement (In Clinic Only). Skin Barriers/Peri-Wound Care: Wound #1 Plantar Foot: Antifungal cream Wound #2 Left,Circumferential Foot: Antifungal cream Primary Wound Dressing: Wound #1 Plantar Foot: Silvercel Non-Adherent Wound #2 Left,Circumferential Foot: Silvercel Non-Adherent Secondary Dressing: Wound #1 Plantar Foot: ABD pad Dry Gauze Conform/Kerlix Wound #2 Left,Circumferential Foot: ABD pad Dry Gauze Conform/Kerlix Dressing Change Frequency: Wound #1 Plantar Foot: Change dressing every day. Wound #2 Left,Circumferential Foot: Change dressing every day. Follow-up Appointments: Return Appointment in 1 week. Edema Control: Wound #1 Plantar Foot: Elevate legs to the level of the heart and pump ankles as often as possible Wound #2 Left,Circumferential Foot: Elevate legs to the level of the heart and pump ankles as often as possible Additional Orders / Instructions: Wound #1 Plantar Foot: Vitamin [name]; Vitamin C, Zinc Increase protein intake. Wound #2 Left,Circumferential Foot: Vitamin [name]; Vitamin C,  Zinc Increase protein intake. The following medication(s) was prescribed: lidocaine topical 4 % cream 1 1 cream topical o #1 ciprofloxacin 500 twice [name] day for 10 days #2 silver alginate between his toes Marc [name], Marc [name] ([id number]) #3 continue with oral antifungal and topical antifungals #4 I think the patient is doing very well. He works in [name] work [employer] and he is taking another week off work which I think is reasonable that he should be able to go back next week  Electronic Signature(s) Signed: 09/11/2017 4:35:05 PM By: Linton Ham MD Entered By: Linton Ham on 09/11/2017 16:04:02 Marc [name], Marc [name] ([id number]) -------------------------------------------------------------------------------- SuperBill Details Patient Name: Marc [name], Marc [name] Date of Service: 09/11/2017 Medical Record Number: [id number] Patient Account Number: [id number] Date of Birth/Sex: 1970-10-21 (46 y.o. Male) Treating RN: Ahmed Prima Primary Care Provider: PATIENT, NO Other Clinician: Referring Provider: Harvest Dark Treating Provider/Extender: Ricard Dillon Weeks in Treatment: 1 Diagnosis Coding ICD-10 Codes Code Description [phone] Non-pressure chronic ulcer of other part of left foot with fat layer exposed L03.116 Cellulitis of left lower limb B35.3 Tinea pedis Facility Procedures CPT4 Code: 29937169 Description: 67893 - WOUND CARE VISIT-LEV 3 EST PT Modifier: Quantity: 1 Physician Procedures CPT4 Code: 8101751 Description: 02585 - WC PHYS LEVEL 3 - EST PT ICD-10 Diagnosis Description L97.522 Non-pressure chronic ulcer of other part of left foot with fa L03.116 Cellulitis of left lower limb B35.3 Tinea pedis Modifier: t layer exposed Quantity: 1 Electronic Signature(s) Signed: 09/11/2017 4:26:49 PM By: Alric Quan Signed: 09/11/2017 4:35:05 PM By: Linton Ham MD Entered By: Alric Quan on 09/11/2017 16:26:49

## 2017-09-11 NOTE — Progress Notes (Signed)
MERIC, JOYE ([id number]) Visit Report for 09/11/2017 Arrival Information Details Patient Name: [name], [name] Date of Service: 09/11/2017 3:30 PM Medical Record Number: [id number] Patient Account Number: [id number] Date of Birth/Sex: 09/14/1971 (46 y.o. Male) Treating RN: Phillis Haggis Primary Care Elwood Bazinet: PATIENT, NO Other Clinician: Referring Manjot Hinks: Minna Antis Treating Norabelle Kondo/Extender: Altamese [place] in Treatment: 1 Visit Information History Since Last Visit All ordered tests and consults were completed: No Patient Arrived: Crutches Added or deleted any medications: No Arrival Time: 15:18 Any new allergies or adverse reactions: No Accompanied By: girlfriend Had a fall or experienced change in No Transfer Assistance: EasyPivot Patient activities of daily living that may affect Lift risk of falls: Patient Identification Verified: Yes Signs or symptoms of abuse/neglect since last visito No Secondary Verification Process Yes Hospitalized since last visit: No Completed: Has Dressing in Place as Prescribed: Yes Patient Requires Transmission-Based No Precautions: Pain Present Now: No Patient Has Alerts: No Electronic Signature(s) Signed: 09/11/2017 4:38:32 PM By: Alejandro Mulling Entered By: Alejandro Mulling on 09/11/2017 15:19:43 [name], [name] ([id number]) -------------------------------------------------------------------------------- Clinic Level of Care Assessment Details Patient Name: [name], [name] Date of Service: 09/11/2017 3:30 PM Medical Record Number: [id number] Patient Account Number: [id number] Date of Birth/Sex: September 22, 1970 (46 y.o. Male) Treating RN: Phillis Haggis Primary Care Leanna Hamid: PATIENT, NO Other Clinician: Referring Kasiyah Platter: Minna Antis Treating Tyus Kallam/Extender: Altamese [place] in Treatment: 1 Clinic Level of Care Assessment Items TOOL 4 Quantity Score X - Use when only an EandM is performed on FOLLOW-UP  visit 1 0 ASSESSMENTS - Nursing Assessment / Reassessment X - Reassessment of Co-morbidities (includes updates in patient status) 1 10 X- 1 5 Reassessment of Adherence to Treatment Plan ASSESSMENTS - Wound and Skin Assessment / Reassessment []  - Simple Wound Assessment / Reassessment - one wound 0 X- 2 5 Complex Wound Assessment / Reassessment - multiple wounds []  - 0 Dermatologic / Skin Assessment (not related to wound area) ASSESSMENTS - Focused Assessment []  - Circumferential Edema Measurements - multi extremities 0 []  - 0 Nutritional Assessment / Counseling / Intervention []  - 0 Lower Extremity Assessment (monofilament, tuning fork, pulses) []  - 0 Peripheral Arterial Disease Assessment (using hand held doppler) ASSESSMENTS - Ostomy and/or Continence Assessment and Care []  - Incontinence Assessment and Management 0 []  - 0 Ostomy Care Assessment and Management (repouching, etc.) PROCESS - Coordination of Care X - Simple Patient / Family Education for ongoing care 1 15 []  - 0 Complex (extensive) Patient / Family Education for ongoing care []  - 0 Staff obtains Chiropractor, Records, Test Results / Process Orders []  - 0 Staff telephones HHA, Nursing Homes / Clarify orders / etc []  - 0 Routine Transfer to another Facility (non-emergent condition) []  - 0 Routine Hospital Admission (non-emergent condition) []  - 0 New Admissions / Manufacturing engineer / Ordering NPWT, Apligraf, etc. []  - 0 Emergency Hospital Admission (emergent condition) X- 1 10 Simple Discharge Coordination Mcelhannon, Wilfrid A. ([id number]) []  - 0 Complex (extensive) Discharge Coordination PROCESS - Special Needs []  - Pediatric / Minor Patient Management 0 []  - 0 Isolation Patient Management []  - 0 Hearing / Language / Visual special needs []  - 0 Assessment of Community assistance (transportation, D/C planning, etc.) []  - 0 Additional assistance / Altered mentation []  - 0 Support Surface(s) Assessment  (bed, cushion, seat, etc.) INTERVENTIONS - Wound Cleansing / Measurement []  - Simple Wound Cleansing - one wound 0 X- 2 5 Complex Wound Cleansing - multiple wounds X- 1 5 Wound Imaging (photographs -  any number of wounds) []  - 0 Wound Tracing (instead of photographs) []  - 0 Simple Wound Measurement - one wound X- 2 5 Complex Wound Measurement - multiple wounds INTERVENTIONS - Wound Dressings X - Small Wound Dressing one or multiple wounds 1 10 X- 1 15 Medium Wound Dressing one or multiple wounds []  - 0 Large Wound Dressing one or multiple wounds X- 1 5 Application of Medications - topical []  - 0 Application of Medications - injection INTERVENTIONS - Miscellaneous []  - External ear exam 0 []  - 0 Specimen Collection (cultures, biopsies, blood, body fluids, etc.) []  - 0 Specimen(s) / Culture(s) sent or taken to Lab for analysis []  - 0 Patient Transfer (multiple staff / Nurse, adult / Similar devices) []  - 0 Simple Staple / Suture removal (25 or less) []  - 0 Complex Staple / Suture removal (26 or more) []  - 0 Hypo / Hyperglycemic Management (close monitor of Blood Glucose) []  - 0 Ankle / Brachial Index (ABI) - do not check if billed separately X- 1 5 Vital Signs Chovanec, Angel A. ([id number]) Has the patient been seen at the hospital within the last three years: Yes Total Score: 110 Level Of Care: New/Established - Level 3 Electronic Signature(s) Signed: 09/11/2017 4:38:32 PM By: Alejandro Mulling Entered By: Alejandro Mulling on 09/11/2017 16:26:42 [name], [name] ([id number]) -------------------------------------------------------------------------------- Encounter Discharge Information Details Patient Name: [name], [name] Date of Service: 09/11/2017 3:30 PM Medical Record Number: [id number] Patient Account Number: [id number] Date of Birth/Sex: 21-Feb-1971 (46 y.o. Male) Treating RN: Phillis Haggis Primary Care Kelia Gibbon: PATIENT, NO Other Clinician: Referring Itzell Bendavid:  Minna Antis Treating Krystelle Prashad/Extender: Altamese [place] in Treatment: 1 Encounter Discharge Information Items Discharge Pain Level: 0 Discharge Condition: Stable Ambulatory Status: Crutches Discharge Destination: Home Transportation: Private Auto Accompanied By: girlfriend Schedule Follow-up Appointment: Yes Medication Reconciliation completed and No provided to Patient/Care Laila Myhre: Provided on Clinical Summary of Care: 09/11/2017 Form Type Recipient Paper Patient DC Electronic Signature(s) Signed: 09/11/2017 4:25:49 PM By: Alejandro Mulling Entered By: Alejandro Mulling on 09/11/2017 16:25:49 Frangos, Lanorris A. ([id number]) -------------------------------------------------------------------------------- Lower Extremity Assessment Details Patient Name: [name], [name] Date of Service: 09/11/2017 3:30 PM Medical Record Number: [id number] Patient Account Number: [id number] Date of Birth/Sex: 08/18/1971 (46 y.o. Male) Treating RN: Phillis Haggis Primary Care Milika Ventress: PATIENT, NO Other Clinician: Referring Ebonique Hallstrom: Minna Antis Treating Darleene Cumpian/Extender: Maxwell Caul Weeks in Treatment: 1 Vascular Assessment Pulses: Dorsalis Pedis Palpable: [Left:Yes] [Right:Yes] Posterior Tibial Extremity colors, hair growth, and conditions: Extremity Color: [Left:Normal] [Right:Normal] Temperature of Extremity: [Left:Warm] [Right:Warm] Capillary Refill: [Left:< 3 seconds] [Right:< 3 seconds] Electronic Signature(s) Signed: 09/11/2017 4:38:32 PM By: Alejandro Mulling Entered By: Alejandro Mulling on 09/11/2017 15:29:54 [name], [name] ([id number]) -------------------------------------------------------------------------------- Multi Wound Chart Details Patient Name: [name], Adian A. Date of Service: 09/11/2017 3:30 PM Medical Record Number: [id number] Patient Account Number: [id number] Date of Birth/Sex: 08-Nov-1970 (46 y.o. Male) Treating RN: Phillis Haggis Primary Care  Cheila Wickstrom: PATIENT, NO Other Clinician: Referring Amon Costilla: Minna Antis Treating Adja Ruff/Extender: Maxwell Caul Weeks in Treatment: 1 Vital Signs Height(in): 71 Pulse(bpm): 70 Weight(lbs): 180 Blood Pressure(mmHg): 113/79 Body Mass Index(BMI): 25 Temperature(F): 98.2 Respiratory Rate 20 (breaths/min): Photos: [1:No Photos] [2:No Photos] [N/A:N/A] Wound Location: [1:Foot - Plantar] [2:Left Foot - Circumfernential] [N/A:N/A] Wounding Event: [1:Gradually Appeared] [2:Gradually Appeared] [N/A:N/A] Primary Etiology: [1:Atypical] [2:Atypical] [N/A:N/A] Date Acquired: [1:08/05/2017] [2:08/05/2017] [N/A:N/A] Weeks of Treatment: [1:1] [2:1] [N/A:N/A] Wound Status: [1:Open] [2:Open] [N/A:N/A] Pending Amputation on [1:Yes] [2:Yes] [N/A:N/A] Presentation: Measurements L x W x D [1:1.8x1.8x0.1] [2:5x25x0.1] [N/A:N/A] (cm)  Area (cm) : [1:2.545] [2:98.175] [N/A:N/A] Volume (cm) : [1:0.254] [2:9.817] [N/A:N/A] % Reduction in Area: [1:0.00%] [2:55.40%] [N/A:N/A] % Reduction in Volume: [1:0.00%] [2:55.40%] [N/A:N/A] Classification: [1:Partial Thickness] [2:Partial Thickness] [N/A:N/A] Exudate Amount: [1:Large] [2:Large] [N/A:N/A] Exudate Type: [1:Serous] [2:Serous] [N/A:N/A] Exudate Color: [1:amber] [2:amber] [N/A:N/A] Foul Odor After Cleansing: [1:Yes] [2:Yes] [N/A:N/A] Odor Anticipated Due to [1:No] [2:No] [N/A:N/A] Product Use: Wound Margin: [1:Distinct, outline attached] [2:Distinct, outline attached] [N/A:N/A] Granulation Amount: [1:Large (67-100%)] [2:Medium (34-66%)] [N/A:N/A] Granulation Quality: [1:Red] [2:Red] [N/A:N/A] Necrotic Amount: [1:Small (1-33%)] [2:Medium (34-66%)] [N/A:N/A] Exposed Structures: [1:Fascia: No Fat Layer (Subcutaneous Tissue) Exposed: No Tendon: No Muscle: No Joint: No Bone: No] [2:Fascia: No Fat Layer (Subcutaneous Tissue) Exposed: No Tendon: No Muscle: No Joint: No Bone: No] [N/A:N/A] Epithelialization: [1:None] [2:None]  [N/A:N/A] Periwound Skin Texture: [1:No Abnormalities Noted] [2:Excoriation: Yes Induration: Yes] [N/A:N/A] Periwound Skin Moisture: [N/A:N/A] Maceration: Yes Maceration: Yes Dry/Scaly: Yes Dry/Scaly: Yes Periwound Skin Color: No Abnormalities Noted Erythema: Yes N/A Erythema Location: N/A Circumferential N/A Temperature: No Abnormality No Abnormality N/A Tenderness on Palpation: Yes Yes N/A Wound Preparation: Ulcer Cleansing: Ulcer Cleansing: N/A Rinsed/Irrigated with Saline Rinsed/Irrigated with Saline Topical Anesthetic Applied: Topical Anesthetic Applied: Other: lidocaine 4% Other: lidocaine 4% Treatment Notes Electronic Signature(s) Signed: 09/11/2017 4:35:05 PM By: Baltazar Najjar MD Entered By: Baltazar Najjar on 09/11/2017 15:56:40 [name], [name] ([id number]) -------------------------------------------------------------------------------- Multi-Disciplinary Care Plan Details Patient Name: [name], [name] Date of Service: 09/11/2017 3:30 PM Medical Record Number: [id number] Patient Account Number: [id number] Date of Birth/Sex: 03/13/1971 (46 y.o. Male) Treating RN: Phillis Haggis Primary Care Lorann Tani: PATIENT, NO Other Clinician: Referring Tracee Mccreery: Minna Antis Treating Rayburn Mundis/Extender: Altamese [place] in Treatment: 1 Active Inactive ` Abuse / Safety / Falls / Self Care Management Nursing Diagnoses: Potential for falls Goals: Patient will not experience any injury related to falls Date Initiated: 09/04/2017 Target Resolution Date: 12/21/2017 Goal Status: Active Interventions: Assess impairment of mobility on admission and as needed per policy Assess personal safety and home safety (as indicated) on admission and as needed Assess self care needs on admission and as needed Provide education on basic hygiene Provide education on fall prevention Treatment Activities: Education provided on Basic Hygiene : 09/04/2017 Notes: ` Nutrition Nursing  Diagnoses: Imbalanced nutrition Potential for alteratiion in Nutrition/Potential for imbalanced nutrition Goals: Patient/caregiver agrees to and verbalizes understanding of need to use nutritional supplements and/or vitamins as prescribed Date Initiated: 09/04/2017 Target Resolution Date: 12/21/2017 Goal Status: Active Interventions: Assess patient nutrition upon admission and as needed per policy Notes: ` Orientation to the Wound Care Program Lillian, Johnte A. ([id number]) Nursing Diagnoses: Knowledge deficit related to the wound healing center program Goals: Patient/caregiver will verbalize understanding of the Wound Healing Center Program Date Initiated: 09/04/2017 Target Resolution Date: 09/21/2017 Goal Status: Active Interventions: Provide education on orientation to the wound center Notes: ` Pain, Acute or Chronic Nursing Diagnoses: Pain, acute or chronic: actual or potential Potential alteration in comfort, pain Goals: Patient/caregiver will verbalize adequate pain control between visits Date Initiated: 09/04/2017 Target Resolution Date: 11/23/2017 Goal Status: Active Interventions: Complete pain assessment as per visit requirements Notes: ` Soft Tissue Infection Nursing Diagnoses: Impaired tissue integrity Knowledge deficit related to disease process and management Knowledge deficit related to home infection control: handwashing, handling of soiled dressings, supply storage Potential for infection: soft tissue Goals: Patient/caregiver will verbalize understanding of or measures to prevent infection and contamination in the home setting Date Initiated: 09/04/2017 Target Resolution Date: 11/23/2017 Goal Status: Active Signs and symptoms of infection will be recognized early  to allow for prompt treatment Date Initiated: 09/04/2017 Target Resolution Date: 12/21/2017 Goal Status: Active Interventions: Assess signs and symptoms of infection every visit Provide education on  infection Treatment Activities: Education provided on Infection : 09/04/2017 [name], [name] ([id number]) Notes: ` Wound/Skin Impairment Nursing Diagnoses: Impaired tissue integrity Knowledge deficit related to smoking impact on wound healing Knowledge deficit related to ulceration/compromised skin integrity Goals: Ulcer/skin breakdown will have a volume reduction of 80% by week 12 Date Initiated: 09/04/2017 Target Resolution Date: 12/21/2017 Goal Status: Active Interventions: Assess patient/caregiver ability to perform ulcer/skin care regimen upon admission and as needed Assess ulceration(s) every visit Provide education on smoking Provide education on ulcer and skin care Notes: Electronic Signature(s) Signed: 09/11/2017 4:38:32 PM By: Alejandro MullingPinkerton, Debra Entered By: Alejandro MullingPinkerton, Debra on 09/11/2017 15:35:21 [name], [name] ([id number]) -------------------------------------------------------------------------------- Pain Assessment Details Patient Name: Marc DragonARR, Tison A. Date of Service: 09/11/2017 3:30 PM Medical Record Number: [id number] Patient Account Number: [id number] Date of Birth/Sex: 10/05/1970 [age] y.o. Male) Treating RN: Phillis HaggisPinkerton, Debi Primary Care Attila Mccarthy: PATIENT, NO Other Clinician: Referring Khylah Kendra: Minna AntisPADUCHOWSKI, KEVIN Treating Abdulai Blaylock/Extender: Maxwell CaulOBSON, MICHAEL G Weeks in Treatment: 1 Active Problems Location of Pain Severity and Description of Pain Patient Has Paino No Site Locations Pain Management and Medication Current Pain Management: Electronic Signature(s) Signed: 09/11/2017 4:38:32 PM By: Alejandro MullingPinkerton, Debra Entered By: Alejandro MullingPinkerton, Debra on 09/11/2017 15:19:47 [name], [name] ([id number]) -------------------------------------------------------------------------------- Patient/Caregiver Education Details Patient Name: Marc DragonARR, Mathews A. Date of Service: 09/11/2017 3:30 PM Medical Record Number: [id number] Patient Account Number: [id number] Date of Birth/Gender:  05/19/1971 [age] y.o. Male) Treating RN: Phillis HaggisPinkerton, Debi Primary Care Physician: PATIENT, NO Other Clinician: Referring Physician: Minna AntisPADUCHOWSKI, KEVIN Treating Physician/Extender: Altamese CarolinaOBSON, MICHAEL G Weeks in Treatment: 1 Education Assessment Education Provided To: Patient Education Topics Provided Wound/Skin Impairment: Handouts: Caring for Your Ulcer, Other: change dressing as ordered Methods: Demonstration, Explain/Verbal Responses: State content correctly Electronic Signature(s) Signed: 09/11/2017 4:38:32 PM By: Alejandro MullingPinkerton, Debra Entered By: Alejandro MullingPinkerton, Debra on 09/11/2017 16:26:07 Myhand, Jerrick A. ([id number]) -------------------------------------------------------------------------------- Wound Assessment Details Patient Name: Marc DragonARR, Tavaras A. Date of Service: 09/11/2017 3:30 PM Medical Record Number: [id number] Patient Account Number: [id number] Date of Birth/Sex: 11/30/1970 [age] y.o. Male) Treating RN: Phillis HaggisPinkerton, Debi Primary Care Amber Williard: PATIENT, NO Other Clinician: Referring Marylene Masek: Minna AntisPADUCHOWSKI, KEVIN Treating Nikoloz Huy/Extender: Maxwell CaulOBSON, MICHAEL G Weeks in Treatment: 1 Wound Status Wound Number: 1 Primary Etiology: Atypical Wound Location: Foot - Plantar Wound Status: Open Wounding Event: Gradually Appeared Date Acquired: 08/05/2017 Weeks Of Treatment: 1 Clustered Wound: No Pending Amputation On Presentation Photos Photo Uploaded By: Alejandro MullingPinkerton, Debra on 09/11/2017 16:29:59 Wound Measurements Length: (cm) 1.8 Width: (cm) 1.8 Depth: (cm) 0.1 Area: (cm) 2.545 Volume: (cm) 0.254 % Reduction in Area: 0% % Reduction in Volume: 0% Epithelialization: None Tunneling: No Undermining: No Wound Description Classification: Partial Thickness Wound Margin: Distinct, outline attached Exudate Amount: Large Exudate Type: Serous Exudate Color: amber Foul Odor After Cleansing: Yes Due to Product Use: No Slough/Fibrino Yes Wound Bed Granulation Amount: Large (67-100%) Exposed  Structure Granulation Quality: Red Fascia Exposed: No Necrotic Amount: Small (1-33%) Fat Layer (Subcutaneous Tissue) Exposed: No Necrotic Quality: Adherent Slough Tendon Exposed: No Muscle Exposed: No Joint Exposed: No Bone Exposed: No Periwound Skin Texture [name], [name] ([id number]) Texture Color No Abnormalities Noted: No No Abnormalities Noted: No Moisture Temperature / Pain No Abnormalities Noted: No Temperature: No Abnormality Dry / Scaly: Yes Tenderness on Palpation: Yes Maceration: Yes Wound Preparation Ulcer Cleansing: Rinsed/Irrigated with Saline Topical Anesthetic Applied: Other: lidocaine 4%, Treatment Notes Wound #1 (Plantar Foot) 1. Cleansed  with: Clean wound with Normal Saline 2. Anesthetic Topical Lidocaine 4% cream to wound bed prior to debridement 3. Peri-wound Care: Antifungal cream 4. Dressing Applied: Other dressing (specify in notes) 5. Secondary Dressing Applied ABD Pad Dry Gauze Kerlix/Conform 7. Secured with Tape Notes silvercel Electronic Signature(s) Signed: 09/11/2017 4:38:32 PM By: Alejandro MullingPinkerton, Debra Entered By: Alejandro MullingPinkerton, Debra on 09/11/2017 15:28:11 Prosise, Ediel A. ([id number]) -------------------------------------------------------------------------------- Wound Assessment Details Patient Name: Marc DragonARR, Jamien A. Date of Service: 09/11/2017 3:30 PM Medical Record Number: [id number] Patient Account Number: [id number] Date of Birth/Sex: 08/21/1971 [age] y.o. Male) Treating RN: Phillis HaggisPinkerton, Debi Primary Care Macall Mccroskey: PATIENT, NO Other Clinician: Referring Tawanda Schall: Minna AntisPADUCHOWSKI, KEVIN Treating Alexey Rhoads/Extender: Maxwell CaulOBSON, MICHAEL G Weeks in Treatment: 1 Wound Status Wound Number: 2 Primary Etiology: Atypical Wound Location: Left Foot - Circumfernential Wound Status: Open Wounding Event: Gradually Appeared Date Acquired: 08/05/2017 Weeks Of Treatment: 1 Clustered Wound: No Pending Amputation On Presentation Photos Photo Uploaded By:  Alejandro MullingPinkerton, Debra on 09/11/2017 16:29:59 Wound Measurements Length: (cm) 5 Width: (cm) 25 Depth: (cm) 0.1 Area: (cm) 98.175 Volume: (cm) 9.817 % Reduction in Area: 55.4% % Reduction in Volume: 55.4% Epithelialization: None Tunneling: No Undermining: No Wound Description Classification: Partial Thickness Wound Margin: Distinct, outline attached Exudate Amount: Large Exudate Type: Serous Exudate Color: amber Foul Odor After Cleansing: Yes Due to Product Use: No Slough/Fibrino Yes Wound Bed Granulation Amount: Medium (34-66%) Exposed Structure Granulation Quality: Red Fascia Exposed: No Necrotic Amount: Medium (34-66%) Fat Layer (Subcutaneous Tissue) Exposed: No Necrotic Quality: Adherent Slough Tendon Exposed: No Muscle Exposed: No Joint Exposed: No Bone Exposed: No Periwound Skin Texture Limones, Landon A. ([id number]) Texture Color No Abnormalities Noted: No No Abnormalities Noted: No Excoriation: Yes Erythema: Yes Induration: Yes Erythema Location: Circumferential Moisture Temperature / Pain No Abnormalities Noted: No Temperature: No Abnormality Dry / Scaly: Yes Tenderness on Palpation: Yes Maceration: Yes Wound Preparation Ulcer Cleansing: Rinsed/Irrigated with Saline Topical Anesthetic Applied: Other: lidocaine 4%, Treatment Notes Wound #2 (Left, Circumferential Foot) 1. Cleansed with: Clean wound with Normal Saline 2. Anesthetic Topical Lidocaine 4% cream to wound bed prior to debridement 3. Peri-wound Care: Antifungal cream 4. Dressing Applied: Other dressing (specify in notes) 5. Secondary Dressing Applied ABD Pad Dry Gauze Kerlix/Conform 7. Secured with Tape Notes silvercel Electronic Signature(s) Signed: 09/11/2017 4:38:32 PM By: Alejandro MullingPinkerton, Debra Entered By: Alejandro MullingPinkerton, Debra on 09/11/2017 15:27:14 Dykema, Arhaan A. ([id number]) -------------------------------------------------------------------------------- Vitals Details Patient Name:  Marc DragonARR, Jakub A. Date of Service: 09/11/2017 3:30 PM Medical Record Number: [id number] Patient Account Number: [id number] Date of Birth/Sex: 06/16/1971 [age] y.o. Male) Treating RN: Phillis HaggisPinkerton, Debi Primary Care Hawkin Charo: PATIENT, NO Other Clinician: Referring Rickie Gutierres: Minna AntisPADUCHOWSKI, KEVIN Treating Jeremy Ditullio/Extender: Maxwell CaulOBSON, MICHAEL G Weeks in Treatment: 1 Vital Signs Time Taken: 15:19 Temperature (F): 98.2 Height (in): 71 Pulse (bpm): 70 Weight (lbs): 180 Respiratory Rate (breaths/min): 20 Body Mass Index (BMI): 25.1 Blood Pressure (mmHg): 113/79 Reference Range: 80 - 120 mg / dl Electronic Signature(s) Signed: 09/11/2017 4:38:32 PM By: Alejandro MullingPinkerton, Debra Entered By: Alejandro MullingPinkerton, Debra on 09/11/2017 15:21:49

## 2017-09-18 ENCOUNTER — Encounter: Payer: No Typology Code available for payment source | Attending: Internal Medicine | Admitting: Internal Medicine

## 2017-09-18 DIAGNOSIS — E785 Hyperlipidemia, unspecified: Secondary | ICD-10-CM | POA: Diagnosis not present

## 2017-09-18 DIAGNOSIS — B353 Tinea pedis: Secondary | ICD-10-CM | POA: Diagnosis not present

## 2017-09-18 DIAGNOSIS — L97522 Non-pressure chronic ulcer of other part of left foot with fat layer exposed: Secondary | ICD-10-CM | POA: Insufficient documentation

## 2017-09-18 DIAGNOSIS — L03116 Cellulitis of left lower limb: Secondary | ICD-10-CM | POA: Insufficient documentation

## 2017-09-18 DIAGNOSIS — F17210 Nicotine dependence, cigarettes, uncomplicated: Secondary | ICD-10-CM | POA: Diagnosis not present

## 2017-09-19 NOTE — Progress Notes (Signed)
[name], [name] ([id number]) Visit Report for 09/18/2017 HPI Details Patient Name: Marc [name], Marc [name] Date of Service: 09/18/2017 4:00 PM Medical Record Number: [id number] Patient Account Number: [id number] Date of Birth/Sex: 08-20-1971 (47 y.o. Male) Treating RN: Ahmed Prima Primary Care Provider: PATIENT, NO Other Clinician: Referring Provider: Harvest Dark Treating Provider/Extender: Ricard Dillon Weeks in Treatment: 2 History of Present Illness HPI Description: 09/04/17; this is [name] 47 year old man who was not [name] diabetic and has no known history of peripheral vascular disease. He does have hyperlipidemia and [name] history of cocaine abuse although he insists that this is not active. He tells me that about [name] month ago he started having denuded skin between the toes of his left foot and on the plantar aspect of his left foot. He was picking at the skin and pulling it off. More recently he has had involvement of the toes on the right foot. Week or 2 ago he started getting swelling and pain in the left foot and he was seen in the ER on 08/29/17 he was diagnosed with cellulitis. The only ulcer that they commented on was the left fifth toe. An x-ray suggested ulcer at the fifth MTP slightly osteopenic appearance of the fifth met head question early osteo. He has 14 days of Bactrim DS prescribed and the patient states the swelling and pain in the left foot is improved since starting this 6 days ago. ABIs in this clinic were 1.28 on the right and 1.3 on the left. The patient is [name] smoker at one half pack per day 09/11/17; patient came last week and in general I think he had severe tinea pedis with secondary bacterial cellulitis. He was on Septra DS and is completed this. I gave him oral terbinafine for the tinea pedis. [name] culture I did on the left foot last week grew Pseudomonas which is quinolone sensitive. I'll give him 10 days of ciprofloxacin today. However he arrives with his foot looking [name] lot  better. There is less erythema and less warmth 09/18/16; patient has completed this systemic antifungal therapy and antibiotics for secondary bacterial cellulitis. He is using Lotrimin between his toes and silver alginate. All of this looks [name] lot better Electronic Signature(s) Signed: 09/18/2017 5:48:27 PM By: Linton Ham MD Entered By: Linton Ham on 09/18/2017 17:35:37 [name], Marc [name] ([id number]) -------------------------------------------------------------------------------- Physical Exam Details Patient Name: Marc [name], Marc [name] Date of Service: 09/18/2017 4:00 PM Medical Record Number: [id number] Patient Account Number: [id number] Date of Birth/Sex: 08/31/1971 (47 y.o. Male) Treating RN: Ahmed Prima Primary Care Provider: PATIENT, NO Other Clinician: Referring Provider: Harvest Dark Treating Provider/Extender: Ricard Dillon Weeks in Treatment: 2 Constitutional Sitting or standing Blood Pressure is within target range for patient.. Pulse regular and within target range for patient.Marland Kitchen Respirations regular, non-labored and within target range.. Temperature is normal and within the target range for the patient.Marland Kitchen appears in no distress. Notes wound exam; the patient's left foot has really improved although there is still some areas that look like tinea between several of his toe web spaces similarly on the right foot limited between his toes. The intense area of cellulitis on the left foot is [name] lot better and epithelialized Electronic Signature(s) Signed: 09/18/2017 5:48:27 PM By: Linton Ham MD Entered By: Linton Ham on 09/18/2017 17:36:30 Marc [name], Marc [name] ([id number]) -------------------------------------------------------------------------------- Physician Orders Details Patient Name: Marc [name], Marc [name] Date of Service: 09/18/2017 4:00 PM Medical Record Number: [id number] Patient Account Number: [id number] Date of Birth/Sex: [birth year] (47 y.o. Male) Treating RN: Carolyne Fiscal,  Debi Primary Care Provider: PATIENT, NO Other Clinician: Referring Provider: Harvest Dark Treating Provider/Extender: Tito Dine in Treatment: 2 Verbal / Phone Orders: Yes Clinician: Pinkerton, Debi Read Back and Verified: Yes Diagnosis Coding Wound Cleansing Wound #1 Right,Plantar Foot o Clean wound with Normal Saline. o Cleanse wound with mild soap and water o May Shower, gently pat wound dry prior to applying new dressing. Wound #2 Left,Circumferential Foot o Clean wound with Normal Saline. o Cleanse wound with mild soap and water o May Shower, gently pat wound dry prior to applying new dressing. Anesthetic (add to Medication List) Wound #1 Right,Plantar Foot o Topical Lidocaine 4% cream applied to wound bed prior to debridement (In Clinic Only). Wound #2 Left,Circumferential Foot o Topical Lidocaine 4% cream applied to wound bed prior to debridement (In Clinic Only). Skin Barriers/Peri-Wound Care Wound #1 Right,Plantar Foot o Antifungal cream Wound #2 Left,Circumferential Foot o Antifungal cream Primary Wound Dressing Wound #1 Right,Plantar Foot o Silvercel Non-Adherent Wound #2 Left,Circumferential Foot o Silvercel Non-Adherent Secondary Dressing Wound #1 Right,Plantar Foot o ABD pad o Dry Gauze o Conform/Kerlix Wound #2 Left,Circumferential Foot o ABD pad o Dry Gauze o Conform/Kerlix Marc [name], Marc [name] ([id number]) Dressing Change Frequency Wound #1 Right,Plantar Foot o Change dressing every day. Wound #2 Left,Circumferential Foot o Change dressing every day. Follow-up Appointments o Return Appointment in 1 week. Edema Control Wound #1 Right,Plantar Foot o Elevate legs to the level of the heart and pump ankles as often as possible Wound #2 Left,Circumferential Foot o Elevate legs to the level of the heart and pump ankles as often as possible Additional Orders / Instructions Wound #1 Right,Plantar  Foot o Vitamin [name]; Vitamin C, Zinc o Increase protein intake. Wound #2 Left,Circumferential Foot o Vitamin [name]; Vitamin C, Zinc o Increase protein intake. Patient Medications Allergies: NKDA Notifications Medication Indication Start End lidocaine DOSE 1 - topical 4 % cream - 1 cream topical Electronic Signature(s) Signed: 09/18/2017 5:16:05 PM By: Alric Quan Signed: 09/18/2017 5:48:27 PM By: Linton Ham MD Entered By: Alric Quan on 09/18/2017 16:25:06 Marc [name], Marc [name] ([id number]) -------------------------------------------------------------------------------- Prescription 09/18/2017 Patient Name: Marc [name], Marc [name] Provider: Ricard Dillon MD Date of Birth: 08/15/1971 NPI#: [id number] Sex: Jerilynn Mages DEA#: [id number] Phone #: [phone] License #: [id number] Patient Address: [facility] [place], [place] [address] [address] [place],  [address] [phone] Allergies NKDA Medication Medication: Route: Strength: Form: lidocaine topical 4% cream Class: TOPICAL LOCAL ANESTHETICS Dose: Frequency / Time: Indication: 1 1 cream topical Number of Refills: Number of Units: 0 Generic Substitution: Start Date: End Date: Administered at [place]: No Note to Pharmacy: Signature(s): Date(s): Electronic Signature(s) Signed: 09/18/2017 5:16:05 PM By: Alric Quan Signed: 09/18/2017 5:48:27 PM By: Linton Ham MD Entered By: Alric Quan on 09/18/2017 16:25:08 Marc [name], Marc [name] ([id number]) --------------------------------------------------------------------------------  Problem List Details Patient Name: Marc [name], Marc [name] Date of Service: 09/18/2017 4:00 PM Medical Record Number: [id number] Patient Account Number: [id number] Date of Birth/Sex: [birth year] (47 y.o. 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 ([id number]) -------------------------------------------------------------------------------- Progress Note Details Patient Name: Marc [name], Marc [name] Date of Service: 09/18/2017 4:00 PM Medical Record Number: [id number] Patient Account Number: [id number] Date of Birth/Sex: 06-11-1971 (47 y.o. Male) Treating RN: Ahmed Prima Primary Care Provider: PATIENT, NO Other Clinician: Referring Provider: Harvest Dark Treating Provider/Extender: Ricard Dillon Weeks in Treatment: 2 Subjective History of Present Illness (HPI) 09/04/17; this is [name] 47 year old man who was not [name] diabetic and has no known history of peripheral vascular disease. He does have hyperlipidemia and [name] history of cocaine abuse although he insists that this is not active. He tells me that about [name] month ago he started having denuded skin between the toes of his left foot and on the plantar aspect of his left foot. He was picking at the skin and pulling it off. More recently he has had involvement of the toes on the right foot. Week or 2 ago he started getting swelling and pain in the left foot and he was seen in the ER on 08/29/17 he was diagnosed with cellulitis. The only ulcer that they commented on was the left fifth toe. An x-ray suggested ulcer at the fifth MTP slightly osteopenic appearance of the fifth met head question early osteo. He has 14 days of Bactrim DS prescribed  and the patient states the swelling and pain in the left foot is improved since starting this 6 days ago. ABIs in this clinic were 1.28 on the right and 1.3 on the left. The patient is [name] smoker at one half pack per day 09/11/17; patient came last week and in general I think he had severe tinea pedis with secondary bacterial cellulitis. He was on Septra DS and is completed this. I gave him oral terbinafine for the tinea pedis. [name] culture I did on the left foot last week grew Pseudomonas which is quinolone sensitive. I'll give him 10 days of ciprofloxacin today. However he arrives with his foot looking [name] lot better. There is less erythema and less warmth 09/18/16; patient has completed this systemic antifungal therapy and antibiotics for secondary bacterial cellulitis. He is using Lotrimin between his toes and silver alginate. All of this looks [name] lot better Objective Constitutional Sitting or standing Blood Pressure is within target range for patient.. Pulse regular and within target range for patient.Marland Kitchen Respirations regular, non-labored and within target range.. Temperature is normal and within the target range for the patient.Marland Kitchen appears in no distress. Vitals Time Taken: 4:03 PM, Height: 71 in, Weight: 180 lbs, BMI: 25.1, Temperature: 98.2 F, Pulse: 74 bpm, Respiratory Rate: 20 breaths/min, Blood Pressure: 119/77 mmHg. General Notes: wound exam; the patient's left foot has really improved although there is still some areas that look like tinea between several of his toe web spaces similarly on the right foot limited between his toes. The intense area of cellulitis on the left foot is [name] lot better and epithelialized Integumentary (Hair, Skin) Wound #1 status is Open. Original cause of wound was Gradually Appeared. The wound is located on the [place]. The wound measures 1cm length x 2.3cm width x 0.1cm depth; 1.806cm^2 area and 0.181cm^3 volume. There is no tunneling or undermining noted.  There is [name] large amount of serous drainage noted. Foul odor after cleansing was noted. The wound margin is distinct with the outline attached to the wound base. There is no granulation within the wound bed. There is [name] Marc [name], Marc [name] ([id number]) large (67-100%) amount of necrotic tissue  within the wound bed including Adherent Slough. The periwound skin appearance exhibited: Maceration. The periwound skin appearance did not exhibit: Dry/Scaly. Periwound temperature was noted as No Abnormality. The periwound has tenderness on palpation. Wound #2 status is Open. Original cause of wound was Gradually Appeared. The wound is located on the Left,Circumferential Foot. The wound measures 1.5cm length x 6cm width x 0.1cm depth; 7.069cm^2 area and 0.707cm^3 volume. There is no tunneling or undermining noted. There is [name] large amount of serous drainage noted. Foul odor after cleansing was noted. The wound margin is distinct with the outline attached to the wound base. There is medium (34-66%) red granulation within the wound bed. There is [name] medium (34-66%) amount of necrotic tissue within the wound bed including Adherent Slough. The periwound skin appearance exhibited: Excoriation, Induration, Maceration. The periwound skin appearance did not exhibit: Dry/Scaly, Erythema. Periwound temperature was noted as No Abnormality. The periwound has tenderness on palpation. Assessment Active Problems ICD-10 L97.522 - Non-pressure chronic ulcer of other part of left foot with fat layer exposed L03.116 - Cellulitis of left lower limb B35.3 - Tinea pedis Plan Wound Cleansing: Wound #1 Right,Plantar Foot: Clean wound with Normal Saline. Cleanse wound with mild soap and water May Shower, gently pat wound dry prior to applying new dressing. Wound #2 Left,Circumferential Foot: Clean wound with Normal Saline. Cleanse wound with mild soap and water May Shower, gently pat wound dry prior to applying new  dressing. Anesthetic (add to Medication List): Wound #1 Right,Plantar Foot: Topical Lidocaine 4% cream applied to wound bed prior to debridement (In Clinic Only). Wound #2 Left,Circumferential Foot: Topical Lidocaine 4% cream applied to wound bed prior to debridement (In Clinic Only). Skin Barriers/Peri-Wound Care: Wound #1 Right,Plantar Foot: Antifungal cream Wound #2 Left,Circumferential Foot: Antifungal cream Primary Wound Dressing: Wound #1 Right,Plantar Foot: Silvercel Non-Adherent Wound #2 Left,Circumferential Foot: Silvercel Non-Adherent Secondary Dressing: Wound #1 Right,Plantar Foot: Marc [name], Marc [name] ([id number]) ABD pad Dry Gauze Conform/Kerlix Wound #2 Left,Circumferential Foot: ABD pad Dry Gauze Conform/Kerlix Dressing Change Frequency: Wound #1 Right,Plantar Foot: Change dressing every day. Wound #2 Left,Circumferential Foot: Change dressing every day. Follow-up Appointments: Return Appointment in 1 week. Edema Control: Wound #1 Right,Plantar Foot: Elevate legs to the level of the heart and pump ankles as often as possible Wound #2 Left,Circumferential Foot: Elevate legs to the level of the heart and pump ankles as often as possible Additional Orders / Instructions: Wound #1 Right,Plantar Foot: Vitamin [name]; Vitamin C, Zinc Increase protein intake. Wound #2 Left,Circumferential Foot: Vitamin [name]; Vitamin C, Zinc Increase protein intake. The following medication(s) was prescribed: lidocaine topical 4 % cream 1 1 cream topical #1 continue with Lotrimin cream and silver alginate to the infected interdigital spaces Electronic Signature(s) Signed: 09/18/2017 5:48:27 PM By: Linton Ham MD Entered By: Linton Ham on 09/18/2017 17:37:15 Placide, Trentan [name] ([id number]) -------------------------------------------------------------------------------- SuperBill Details Patient Name: Marc [name], Deryl [name] Date of Service: 09/18/2017 Medical Record Number: [id number] Patient  Account Number: [id number] Date of Birth/Sex: [birth year] (47 y.o. Male) Treating RN: Ahmed Prima Primary Care Provider: PATIENT, NO Other Clinician: Referring Provider: Harvest Dark Treating Provider/Extender: Ricard Dillon Weeks in Treatment: 2 Diagnosis Coding ICD-10 Codes Code Description [phone] Non-pressure chronic ulcer of other part of left foot with fat layer exposed L03.116 Cellulitis of left lower limb B35.3 Tinea pedis Facility Procedures CPT4 Code: 29518841 Description: 99213 - WOUND CARE VISIT-LEV 3 EST PT Modifier: Quantity: 1 Physician Procedures CPT4 Code: 6606301 Description: 60109 - WC PHYS LEVEL 2 - EST PT ICD-10  Diagnosis Description L97.522 Non-pressure chronic ulcer of other part of left foot with fa L03.116 Cellulitis of left lower limb Modifier: t layer exposed Quantity: 1 Electronic Signature(s) Signed: 09/18/2017 5:48:27 PM By: Linton Ham MD Previous Signature: 09/18/2017 5:04:42 PM Version By: Alric Quan Entered By: Linton Ham on 09/18/2017 17:37:35

## 2017-09-19 NOTE — Progress Notes (Signed)
[name], [name] ([id number]) Visit Report for 09/18/2017 Arrival Information Details Patient Name: [name], [name] Date of Service: 09/18/2017 4:00 PM Medical Record Number: [id number] Patient Account Number: [id number] Date of Birth/Sex: 1971-06-22 (47 y.o. Male) Treating RN: [name] Primary Care [name]: PATIENT, NO Other Clinician: Referring [name]: [name] Treating [name]/Extender: [name] in Treatment: 2 Visit Information History Since Last Visit All ordered tests and consults were completed: No Patient Arrived: Ambulatory Added or deleted any medications: No Arrival Time: 16:02 Any new allergies or adverse reactions: No Accompanied By: girlfriend Had a fall or experienced change in No Transfer Assistance: None activities of daily living that may affect Patient Identification Verified: Yes risk of falls: Secondary Verification Process Completed: Yes Signs or symptoms of abuse/neglect since last visito No Patient Requires Transmission-Based No Hospitalized since last visit: No Precautions: Has Dressing in Place as Prescribed: Yes Patient Has Alerts: No Pain Present Now: No Electronic Signature(s) Signed: 09/18/2017 5:16:05 PM By: [name] Entered By: [name] on 09/18/2017 16:03:33 [name], [name] ([id number]) -------------------------------------------------------------------------------- Clinic Level of Care Assessment Details Patient Name: [name], [name] Date of Service: 09/18/2017 4:00 PM Medical Record Number: [id number] Patient Account Number: [id number] Date of Birth/Sex: 1971/07/31 (47 y.o. Male) Treating RN: [name] Primary Care Saachi Zale: PATIENT, NO Other Clinician: Referring [name]: [name] Treating [name]/Extender: [name] in Treatment: 2 Clinic Level of Care Assessment Items TOOL 4 Quantity Score X - Use when only an EandM is performed on FOLLOW-UP visit 1 0 ASSESSMENTS -  Nursing Assessment / Reassessment X - Reassessment of Co-morbidities (includes updates in patient status) 1 10 X- 1 5 Reassessment of Adherence to Treatment Plan ASSESSMENTS - Wound and Skin Assessment / Reassessment []  - Simple Wound Assessment / Reassessment - one wound 0 X- 2 5 Complex Wound Assessment / Reassessment - multiple wounds []  - 0 Dermatologic / Skin Assessment (not related to wound area) ASSESSMENTS - Focused Assessment []  - Circumferential Edema Measurements - multi extremities 0 []  - 0 Nutritional Assessment / Counseling / Intervention []  - 0 Lower Extremity Assessment (monofilament, tuning fork, pulses) []  - 0 Peripheral Arterial Disease Assessment (using hand held doppler) ASSESSMENTS - Ostomy and/or Continence Assessment and Care []  - Incontinence Assessment and Management 0 []  - 0 Ostomy Care Assessment and Management (repouching, etc.) PROCESS - Coordination of Care X - Simple Patient / Family Education for ongoing care 1 15 []  - 0 Complex (extensive) Patient / Family Education for ongoing care []  - 0 Staff obtains Chiropractor, Records, Test Results / Process Orders []  - 0 Staff telephones HHA, Nursing Homes / Clarify orders / etc []  - 0 Routine Transfer to another Facility (non-emergent condition) []  - 0 Routine Hospital Admission (non-emergent condition) []  - 0 New Admissions / Manufacturing engineer / Ordering NPWT, Apligraf, etc. []  - 0 Emergency Hospital Admission (emergent condition) X- 1 10 Simple Discharge Coordination [name], [name] ([id number]) []  - 0 Complex (extensive) Discharge Coordination PROCESS - Special Needs []  - Pediatric / Minor Patient Management 0 []  - 0 Isolation Patient Management []  - 0 Hearing / Language / Visual special needs []  - 0 Assessment of Community assistance (transportation, D/C planning, etc.) []  - 0 Additional assistance / Altered mentation []  - 0 Support Surface(s) Assessment (bed, cushion, seat,  etc.) INTERVENTIONS - Wound Cleansing / Measurement []  - Simple Wound Cleansing - one wound 0 X- 2 5 Complex Wound Cleansing - multiple wounds X- 1 5 Wound Imaging (photographs - any  number of wounds) []  - 0 Wound Tracing (instead of photographs) []  - 0 Simple Wound Measurement - one wound X- 2 5 Complex Wound Measurement - multiple wounds INTERVENTIONS - Wound Dressings X - Small Wound Dressing one or multiple wounds 2 10 []  - 0 Medium Wound Dressing one or multiple wounds []  - 0 Large Wound Dressing one or multiple wounds []  - 0 Application of Medications - topical []  - 0 Application of Medications - injection INTERVENTIONS - Miscellaneous []  - External ear exam 0 []  - 0 Specimen Collection (cultures, biopsies, blood, body fluids, etc.) []  - 0 Specimen(s) / Culture(s) sent or taken to Lab for analysis []  - 0 Patient Transfer (multiple staff / Nurse, adult / Similar devices) []  - 0 Simple Staple / Suture removal (25 or less) []  - 0 Complex Staple / Suture removal (26 or more) []  - 0 Hypo / Hyperglycemic Management (close monitor of Blood Glucose) []  - 0 Ankle / Brachial Index (ABI) - do not check if billed separately X- 1 5 Vital Signs [name], [name] ([id number]) Has the patient been seen at the hospital within the last three years: Yes Total Score: 100 Level Of Care: New/Established - Level 3 Electronic Signature(s) Signed: 09/18/2017 5:16:05 PM By: [name] Entered By: [name] on 09/18/2017 17:04:37 [name], [name] ([id number]) -------------------------------------------------------------------------------- Encounter Discharge Information Details Patient Name: [name], [name] Date of Service: 09/18/2017 4:00 PM Medical Record Number: [id number] Patient Account Number: [id number] Date of Birth/Sex: 07-31-1971 (47 y.o. Male) Treating RN: [name] Primary Care [name]: PATIENT, NO Other Clinician: Referring [name]: [name] Treating  [name]/Extender: [name] in Treatment: 2 Encounter Discharge Information Items Discharge Pain Level: 0 Discharge Condition: Stable Ambulatory Status: Ambulatory Discharge Destination: Home Transportation: Private Auto Accompanied By: girlfriend Schedule Follow-up Appointment: Yes Medication Reconciliation completed and No provided to Patient/Care Sohail Capraro: Provided on Clinical Summary of Care: 09/18/2017 Form Type Recipient Paper Patient DC Electronic Signature(s) Signed: 09/18/2017 5:16:05 PM By: [name] Entered By: [name] on 09/18/2017 16:55:01 Triska, Dontaye A. ([id number]) -------------------------------------------------------------------------------- Lower Extremity Assessment Details Patient Name: [name], Rhyan A. Date of Service: 09/18/2017 4:00 PM Medical Record Number: [id number] Patient Account Number: [id number] Date of Birth/Sex: Oct 27, 1970 (47 y.o. Male) Treating RN: [name] Primary Care Lylla Eifler: PATIENT, NO Other Clinician: Referring Cedar Ditullio: [name] Treating Hesston Hitchens/Extender: Maxwell Caul Weeks in Treatment: 2 Vascular Assessment Pulses: Dorsalis Pedis Palpable: [Left:Yes] [Right:Yes] Posterior Tibial Extremity colors, hair growth, and conditions: Extremity Color: [Left:Normal] [Right:Normal] Temperature of Extremity: [Left:Warm] [Right:Warm] Capillary Refill: [Left:< 3 seconds] [Right:< 3 seconds] Toe Nail Assessment Left: Right: Thick: Yes Yes Discolored: Yes Yes Deformed: No No Improper Length and Hygiene: No No Electronic Signature(s) Signed: 09/18/2017 5:16:05 PM By: [name] Entered By: [name] on 09/18/2017 16:23:20 [name], [name] ([id number]) -------------------------------------------------------------------------------- Multi Wound Chart Details Patient Name: [name], [name] Date of Service: 09/18/2017 4:00 PM Medical Record Number: [id number] Patient Account Number:  [id number] Date of Birth/Sex: August 09, 1971 (47 y.o. Male) Treating RN: [name] Primary Care Jovonna Nickell: PATIENT, NO Other Clinician: Referring Senna Lape: [name] Treating Quandarius Nill/Extender: Maxwell Caul Weeks in Treatment: 2 Vital Signs Height(in): 71 Pulse(bpm): 74 Weight(lbs): 180 Blood Pressure(mmHg): 119/77 Body Mass Index(BMI): 25 Temperature(F): 98.2 Respiratory Rate 20 (breaths/min): Photos: [N/A:N/A] Wound Location: Right Foot - Plantar Left Foot - Circumfernential N/A Wounding Event: Gradually Appeared Gradually Appeared N/A Primary Etiology: Atypical Atypical N/A Date Acquired: 08/05/2017 08/05/2017 N/A Weeks of Treatment: 2 2 N/A Wound Status: Open Open N/A Pending  Amputation on Yes Yes N/A Presentation: Measurements L x W x D 1x2.3x0.1 1.5x6x0.1 N/A (cm) Area (cm) : 1.806 7.069 N/A Volume (cm) : 0.181 0.707 N/A % Reduction in Area: 29.00% 96.80% N/A % Reduction in Volume: 28.70% 96.80% N/A Classification: Partial Thickness Partial Thickness N/A Exudate Amount: Large Large N/A Exudate Type: Serous Serous N/A Exudate Color: amber amber N/A Foul Odor After Cleansing: Yes Yes N/A Odor Anticipated Due to No No N/A Product Use: Wound Margin: Distinct, outline attached Distinct, outline attached N/A Granulation Amount: None Present (0%) Medium (34-66%) N/A Granulation Quality: N/A Red N/A Necrotic Amount: Large (67-100%) Medium (34-66%) N/A Exposed Structures: Fascia: No Fascia: No N/A Fat Layer (Subcutaneous Fat Layer (Subcutaneous Tissue) Exposed: No Tissue) Exposed: No Tendon: No Tendon: No Eliot, Hiroshi A. ([id number]) Muscle: No Muscle: No Joint: No Joint: No Bone: No Bone: No Epithelialization: None None N/A Periwound Skin Texture: No Abnormalities Noted Excoriation: Yes N/A Induration: Yes Periwound Skin Moisture: Maceration: Yes Maceration: Yes N/A Dry/Scaly: No Dry/Scaly: No Periwound Skin Color: No Abnormalities  Noted Erythema: No N/A Temperature: No Abnormality No Abnormality N/A Tenderness on Palpation: Yes Yes N/A Wound Preparation: Ulcer Cleansing: Ulcer Cleansing: N/A Rinsed/Irrigated with Saline Rinsed/Irrigated with Saline Topical Anesthetic Applied: Topical Anesthetic Applied: None None Treatment Notes Wound #1 (Right, Plantar Foot) 1. Cleansed with: Clean wound with Normal Saline 3. Peri-wound Care: Antifungal powder 4. Dressing Applied: Other dressing (specify in notes) 5. Secondary Dressing Applied Dry Gauze Kerlix/Conform 7. Secured with Tape Notes silvercel Wound #2 (Left, Circumferential Foot) 1. Cleansed with: Clean wound with Normal Saline 3. Peri-wound Care: Antifungal powder 4. Dressing Applied: Other dressing (specify in notes) 5. Secondary Dressing Applied Dry Gauze Kerlix/Conform 7. Secured with Tape Notes silvercel Electronic Signature(s) Signed: 09/18/2017 5:48:27 PM By: Baltazar Najjar MD Previous Signature: 09/18/2017 5:16:05 PM Version By: [name] Entered By: Baltazar Najjar on 09/18/2017 17:34:59 [name], [name] ([id number]) [name], [name] ([id number]) -------------------------------------------------------------------------------- Multi-Disciplinary Care Plan Details Patient Name: [name], [name] Date of Service: 09/18/2017 4:00 PM Medical Record Number: [id number] Patient Account Number: [id number] Date of Birth/Sex: 12/13/1970 (47 y.o. Male) Treating RN: [name] Primary Care Eudora Guevarra: PATIENT, NO Other Clinician: Referring Danijela Vessey: [name] Treating Rashana Andrew/Extender: Marc [place] in Treatment: 2 Active Inactive ` Abuse / Safety / Falls / Self Care Management Nursing Diagnoses: Potential for falls Goals: Patient will not experience any injury related to falls Date Initiated: 09/04/2017 Target Resolution Date: 12/21/2017 Goal Status: Active Interventions: Assess impairment of mobility on admission and as  needed per policy Assess personal safety and home safety (as indicated) on admission and as needed Assess self care needs on admission and as needed Provide education on basic hygiene Provide education on fall prevention Treatment Activities: Education provided on Basic Hygiene : 09/04/2017 Notes: ` Nutrition Nursing Diagnoses: Imbalanced nutrition Potential for alteratiion in Nutrition/Potential for imbalanced nutrition Goals: Patient/caregiver agrees to and verbalizes understanding of need to use nutritional supplements and/or vitamins as prescribed Date Initiated: 09/04/2017 Target Resolution Date: 12/21/2017 Goal Status: Active Interventions: Assess patient nutrition upon admission and as needed per policy Notes: ` Orientation to the Wound Care Program Amory, Malichi A. ([id number]) Nursing Diagnoses: Knowledge deficit related to the wound healing center program Goals: Patient/caregiver will verbalize understanding of the Wound Healing Center Program Date Initiated: 09/04/2017 Target Resolution Date: 09/21/2017 Goal Status: Active Interventions: Provide education on orientation to the wound center Notes: ` Pain, Acute or Chronic Nursing Diagnoses: Pain, acute or chronic: actual or potential Potential alteration  in comfort, pain Goals: Patient/caregiver will verbalize adequate pain control between visits Date Initiated: 09/04/2017 Target Resolution Date: 11/23/2017 Goal Status: Active Interventions: Complete pain assessment as per visit requirements Notes: ` Soft Tissue Infection Nursing Diagnoses: Impaired tissue integrity Knowledge deficit related to disease process and management Knowledge deficit related to home infection control: handwashing, handling of soiled dressings, supply storage Potential for infection: soft tissue Goals: Patient/caregiver will verbalize understanding of or measures to prevent infection and contamination in the home setting Date  Initiated: 09/04/2017 Target Resolution Date: 11/23/2017 Goal Status: Active Signs and symptoms of infection will be recognized early to allow for prompt treatment Date Initiated: 09/04/2017 Target Resolution Date: 12/21/2017 Goal Status: Active Interventions: Assess signs and symptoms of infection every visit Provide education on infection Treatment Activities: Education provided on Infection : 09/04/2017 [name], [name] ([id number]) Notes: ` Wound/Skin Impairment Nursing Diagnoses: Impaired tissue integrity Knowledge deficit related to smoking impact on wound healing Knowledge deficit related to ulceration/compromised skin integrity Goals: Ulcer/skin breakdown will have a volume reduction of 80% by week 12 Date Initiated: 09/04/2017 Target Resolution Date: 12/21/2017 Goal Status: Active Interventions: Assess patient/caregiver ability to perform ulcer/skin care regimen upon admission and as needed Assess ulceration(s) every visit Provide education on smoking Provide education on ulcer and skin care Notes: Electronic Signature(s) Signed: 09/18/2017 5:16:05 PM By: [name] Entered By: [name] on 09/18/2017 16:23:42 [name], [name] ([id number]) -------------------------------------------------------------------------------- Pain Assessment Details Patient Name: [name], Bearl A. Date of Service: 09/18/2017 4:00 PM Medical Record Number: [id number] Patient Account Number: [id number] Date of Birth/Sex: [birth year] (47 y.o. Male) Treating RN: [name] Primary Care Sarahmarie Leavey: PATIENT, NO Other Clinician: Referring Delcie Ruppert: [name] Treating Marche Hottenstein/Extender: Maxwell Caul Weeks in Treatment: 2 Active Problems Location of Pain Severity and Description of Pain Patient Has Paino No Site Locations Pain Management and Medication Current Pain Management: Electronic Signature(s) Signed: 09/18/2017 5:16:05 PM By: [name] Entered By: [name] on  09/18/2017 16:03:40 [name], [name] ([id number]) -------------------------------------------------------------------------------- Patient/Caregiver Education Details Patient Name: [name], Keita A. Date of Service: 09/18/2017 4:00 PM Medical Record Number: [id number] Patient Account Number: [id number] Date of Birth/Gender: [birth year] (47 y.o. Male) Treating RN: [name] Primary Care Physician: PATIENT, NO Other Clinician: Referring Physician: Minna [name] Treating Physician/Extender: Marc [place] in Treatment: 2 Education Assessment Education Provided To: Patient Education Topics Provided Wound/Skin Impairment: Handouts: Caring for Your Ulcer, Other: change dressing as ordered Methods: Demonstration, Explain/Verbal Responses: State content correctly Electronic Signature(s) Signed: 09/18/2017 5:16:05 PM By: [name] Entered By: [name] on 09/18/2017 16:55:16 [name], [name] ([id number]) -------------------------------------------------------------------------------- Wound Assessment Details Patient Name: [name], Jontavious A. Date of Service: 09/18/2017 4:00 PM Medical Record Number: [id number] Patient Account Number: [id number] Date of Birth/Sex: [birth year] (47 y.o. Male) Treating RN: [name] Primary Care Shalayah Beagley: PATIENT, NO Other Clinician: Referring Prabhnoor Ellenberger: [name] Treating Ayrton Mcvay/Extender: Maxwell Caul Weeks in Treatment: 2 Wound Status Wound Number: 1 Primary Etiology: Atypical Wound Location: Right Foot - Plantar Wound Status: Open Wounding Event: Gradually Appeared Date Acquired: 08/05/2017 Weeks Of Treatment: 2 Clustered Wound: No Pending Amputation On Presentation Photos Photo Uploaded By: [name] on 09/18/2017 17:10:57 Wound Measurements Length: (cm) 1 Width: (cm) 2.3 Depth: (cm) 0.1 Area: (cm) 1.806 Volume: (cm) 0.181 % Reduction in Area: 29% % Reduction in Volume: 28.7% Epithelialization:  None Tunneling: No Undermining: No Wound Description Classification: Partial Thickness Wound Margin: Distinct, outline attached Exudate Amount: Large Exudate Type: Serous Exudate Color: amber Foul Odor After Cleansing: Yes Due to  Product Use: No Slough/Fibrino Yes Wound Bed Granulation Amount: None Present (0%) Exposed Structure Necrotic Amount: Large (67-100%) Fascia Exposed: No Necrotic Quality: Adherent Slough Fat Layer (Subcutaneous Tissue) Exposed: No Tendon Exposed: No Muscle Exposed: No Joint Exposed: No Bone Exposed: No Periwound Skin Texture Ganas, Timoth A. ([id number]) Texture Color No Abnormalities Noted: No No Abnormalities Noted: No Moisture Temperature / Pain No Abnormalities Noted: No Temperature: No Abnormality Dry / Scaly: No Tenderness on Palpation: Yes Maceration: Yes Wound Preparation Ulcer Cleansing: Rinsed/Irrigated with Saline Topical Anesthetic Applied: None Treatment Notes Wound #1 (Right, Plantar Foot) 1. Cleansed with: Clean wound with Normal Saline 3. Peri-wound Care: Antifungal powder 4. Dressing Applied: Other dressing (specify in notes) 5. Secondary Dressing Applied Dry Gauze Kerlix/Conform 7. Secured with Tape Notes silvercel Electronic Signature(s) Signed: 09/18/2017 5:16:05 PM By: Marc MullingPinkerton, Debra Entered By: Marc MullingPinkerton, Debra on 09/18/2017 16:16:16 Rotan, Marlin A. ([id number]) -------------------------------------------------------------------------------- Wound Assessment Details Patient Name: Marc DragonARR, Numair A. Date of Service: 09/18/2017 4:00 PM Medical Record Number: [id number] Patient Account Number: [id number] Date of Birth/Sex: 09/09/1971 [age] y.o. Male) Treating RN: Marc HaggisPinkerton, Debi Primary Care Magdalyn Arenivas: PATIENT, NO Other Clinician: Referring Teagen Bucio: Marc AntisPADUCHOWSKI, KEVIN Treating Kenetra Hildenbrand/Extender: Maxwell CaulOBSON, MICHAEL G Weeks in Treatment: 2 Wound Status Wound Number: 2 Primary Etiology: Atypical Wound Location: Left Foot -  Circumfernential Wound Status: Open Wounding Event: Gradually Appeared Date Acquired: 08/05/2017 Weeks Of Treatment: 2 Clustered Wound: No Pending Amputation On Presentation Photos Photo Uploaded By: Marc MullingPinkerton, Debra on 09/18/2017 17:11:18 Wound Measurements Length: (cm) 1.5 Width: (cm) 6 Depth: (cm) 0.1 Area: (cm) 7.069 Volume: (cm) 0.707 % Reduction in Area: 96.8% % Reduction in Volume: 96.8% Epithelialization: None Tunneling: No Undermining: No Wound Description Classification: Partial Thickness Wound Margin: Distinct, outline attached Exudate Amount: Large Exudate Type: Serous Exudate Color: amber Foul Odor After Cleansing: Yes Due to Product Use: No Slough/Fibrino Yes Wound Bed Granulation Amount: Medium (34-66%) Exposed Structure Granulation Quality: Red Fascia Exposed: No Necrotic Amount: Medium (34-66%) Fat Layer (Subcutaneous Tissue) Exposed: No Necrotic Quality: Adherent Slough Tendon Exposed: No Muscle Exposed: No Joint Exposed: No Bone Exposed: No Periwound Skin Texture Wetherington, Jihad A. ([id number]) Texture Color No Abnormalities Noted: No No Abnormalities Noted: No Excoriation: Yes Erythema: No Induration: Yes Temperature / Pain Moisture Temperature: No Abnormality No Abnormalities Noted: No Tenderness on Palpation: Yes Dry / Scaly: No Maceration: Yes Wound Preparation Ulcer Cleansing: Rinsed/Irrigated with Saline Topical Anesthetic Applied: None Treatment Notes Wound #2 (Left, Circumferential Foot) 1. Cleansed with: Clean wound with Normal Saline 3. Peri-wound Care: Antifungal powder 4. Dressing Applied: Other dressing (specify in notes) 5. Secondary Dressing Applied Dry Gauze Kerlix/Conform 7. Secured with Tape Notes silvercel Electronic Signature(s) Signed: 09/18/2017 5:16:05 PM By: Marc MullingPinkerton, Debra Entered By: Marc MullingPinkerton, Debra on 09/18/2017 16:15:16 Janik, Mathias A.  ([id number]) -------------------------------------------------------------------------------- Vitals Details Patient Name: Marc DragonARR, Deshannon A. Date of Service: 09/18/2017 4:00 PM Medical Record Number: [id number] Patient Account Number: [id number] Date of Birth/Sex: 10/11/1970 [age] y.o. Male) Treating RN: Marc HaggisPinkerton, Debi Primary Care Vishruth Seoane: PATIENT, NO Other Clinician: Referring Aitanna Haubner: Marc AntisPADUCHOWSKI, KEVIN Treating Terica Yogi/Extender: Maxwell CaulOBSON, MICHAEL G Weeks in Treatment: 2 Vital Signs Time Taken: 16:03 Temperature (F): 98.2 Height (in): 71 Pulse (bpm): 74 Weight (lbs): 180 Respiratory Rate (breaths/min): 20 Body Mass Index (BMI): 25.1 Blood Pressure (mmHg): 119/77 Reference Range: 80 - 120 mg / dl Electronic Signature(s) Signed: 09/18/2017 5:16:05 PM By: Marc MullingPinkerton, Debra Entered By: Marc MullingPinkerton, Debra on 09/18/2017 16:05:32

## 2017-10-02 ENCOUNTER — Encounter: Payer: No Typology Code available for payment source | Admitting: Internal Medicine

## 2017-10-02 DIAGNOSIS — L97522 Non-pressure chronic ulcer of other part of left foot with fat layer exposed: Secondary | ICD-10-CM | POA: Diagnosis not present

## 2017-10-03 NOTE — Progress Notes (Addendum)
DENARIUS, SESLER ([id number]) Visit Report for 10/02/2017 HPI Details Patient Name: [name], [name] Date of Service: 10/02/2017 3:15 PM Medical Record Number: [id number] Patient Account Number: [id number] Date of Birth/Sex: [birth year] (47 y.o. Male) Treating RN: Cornell Barman Primary Care Provider: PATIENT, NO Other Clinician: Referring Provider: Harvest Dark Treating Provider/Extender: Tito Dine in Treatment: 4 History of Present Illness HPI Description: 09/04/17; this is a 47 year old man who was not a diabetic and has no known history of peripheral vascular disease. He does have hyperlipidemia and a history of cocaine abuse although he insists that this is not active. He tells me that about a month ago he started having denuded skin between the toes of his left foot and on the plantar aspect of his left foot. He was picking at the skin and pulling it off. More recently he has had involvement of the toes on the right foot. Week or 2 ago he started getting swelling and pain in the left foot and he was seen in the ER on 08/29/17 he was diagnosed with cellulitis. The only ulcer that they commented on was the left fifth toe. An x-ray suggested ulcer at the fifth MTP slightly osteopenic appearance of the fifth met head question early osteo. He has 14 days of Bactrim DS prescribed and the patient states the swelling and pain in the left foot is improved since starting this 6 days ago. ABIs in this clinic were 1.28 on the right and 1.3 on the left. The patient is a smoker at one half pack per day 09/11/17; patient came last week and in general I think he had severe tinea pedis with secondary bacterial cellulitis. He was on Septra DS and is completed this. I gave him oral terbinafine for the tinea pedis. A culture I did on the left foot last week grew Pseudomonas which is quinolone sensitive. I'll give him 10 days of ciprofloxacin today. However he arrives with his foot looking a lot  better. There is less erythema and less warmth 09/18/17; patient has completed this systemic antifungal therapy and antibiotics for secondary bacterial cellulitis. He is using Lotrimin between his toes and silver alginate. All of this looks a lot better 10/02/17;everything is cleaned up in the patient's bilateral feet except for the right fourth fifth toe web space. The patient had severe tinea pedis with secondary cellulitis. Multiple denuded areas between his toes. We have treated him successfully for all of this however he still has 1 remaining area Electronic Signature(s) Signed: 10/02/2017 4:56:29 PM By: Linton Ham MD Entered By: Linton Ham on 10/02/2017 16:41:34 [place], [place]. ([id number]) -------------------------------------------------------------------------------- Physical Exam Details Patient Name: [name], [name] Date of Service: 10/02/2017 3:15 PM Medical Record Number: [id number] Patient Account Number: [id number] Date of Birth/Sex: [birth year] (47 y.o. Male) Treating RN: Cornell Barman Primary Care Provider: PATIENT, NO Other Clinician: Referring Provider: Harvest Dark Treating Provider/Extender: Ricard Dillon Weeks in Treatment: 4 Eyes Conjunctivae clear. No discharge. Respiratory Respiratory effort is easy and symmetric bilaterally. Rate is normal at rest and on room air.. Cardiovascular Pedal pulses palpable and strong bilaterally.. Extremities are free of varicosities, clubbing or edema. Peripheral pulses strong and equal. Capillary refill < 3 seconds.. Lymphatic none palpable in the popliteal or inguinal area. Integumentary (Hair, Skin) and the extensive skin damage in his left foot predominantly is all closed over only one area remains on the right is noted. Notes wound exam; the patient's left foot is resolved. His skin is dry and this may  represent continued tinea colonization of this area. He tells me he is moisturizing his feet.the only concerning area  that remains is in the fourth fifth toe web space on the right. Still what looks to be tinea pedis with threatened ulceration. I am not certain why this area did not resolve along with the rest of the extensive disease. The severe cellulitis on the left foot has resolved loss of surface epithelium has been replaced Electronic Signature(s) Signed: 10/02/2017 4:56:29 PM By: Linton Ham MD Entered By: Linton Ham on 10/02/2017 16:43:21 Wallace, Matthe A. ([id number]) -------------------------------------------------------------------------------- Physician Orders Details Patient Name: [name], [name] Date of Service: 10/02/2017 3:15 PM Medical Record Number: [id number] Patient Account Number: [id number] Date of Birth/Sex: 12/21/1970 (47 y.o. Male) Treating RN: Cornell Barman Primary Care Provider: PATIENT, NO Other Clinician: Referring Provider: Harvest Dark Treating Provider/Extender: Tito Dine in Treatment: 4 Verbal / Phone Orders: No Diagnosis Coding Patient Medications Allergies: NKDA Notifications Medication Indication Start End ketoconazole 10/02/2017 DOSE topical 2 % cream - cream topical to affected area Electronic Signature(s) Signed: 10/02/2017 3:50:34 PM By: Linton Ham MD Entered By: Linton Ham on 10/02/2017 15:50:33 Criss, Javeion A. ([id number]) -------------------------------------------------------------------------------- Problem List Details Patient Name: [name], [name] Date of Service: 10/02/2017 3:15 PM Medical Record Number: [id number] Patient Account Number: [id number] Date of Birth/Sex: Jun 13, 1971 (47 y.o. 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 ([id number]) -------------------------------------------------------------------------------- Progress Note Details Patient Name: [name], Xayden A. Date of Service: 10/02/2017 3:15 PM Medical Record Number: [id number] Patient Account Number: [id number] Date of Birth/Sex: 11/05/1970 (47 y.o. Male) Treating RN: Cornell Barman Primary Care Provider: PATIENT, NO Other Clinician: Referring Provider: Harvest Dark Treating Provider/Extender: Ricard Dillon Weeks in Treatment: 4 Subjective History of Present Illness (HPI) 09/04/17; this is a 47 year old man who was not a diabetic and has no known history of peripheral vascular disease. He does have hyperlipidemia and a history of cocaine abuse although he insists that this is not active. He tells me that about a month ago he started having denuded skin between the toes of his left foot and on the plantar aspect of his left foot. He was picking at the skin and pulling it off. More recently he has had involvement of the toes on the right foot. Week or 2 ago he started getting swelling and pain in the left foot and he was seen in the ER on 08/29/17 he was diagnosed with cellulitis. The only ulcer that they commented on was the left fifth toe. An x-ray suggested ulcer at the fifth MTP slightly osteopenic appearance of the fifth met head question early osteo. He has 14 days of Bactrim DS prescribed and the patient states the swelling and pain in the left foot is improved since starting this 6 days ago. ABIs in this clinic were 1.28 on the right and 1.3 on the left. The patient is a smoker at one half pack per day 09/11/17; patient came last week and in general I think  he had severe tinea pedis with secondary bacterial  cellulitis. He was on Septra DS and is completed this. I gave him oral terbinafine for the tinea pedis. A culture I did on the left foot last week grew Pseudomonas which is quinolone sensitive. I'll give him 10 days of ciprofloxacin today. However he arrives with his foot looking a lot better. There is less erythema and less warmth 09/18/17; patient has completed this systemic antifungal therapy and antibiotics for secondary bacterial cellulitis. He is using Lotrimin between his toes and silver alginate. All of this looks a lot better 10/02/17;everything is cleaned up in the patient's bilateral feet except for the right fourth fifth toe web space. The patient had severe tinea pedis with secondary cellulitis. Multiple denuded areas between his toes. We have treated him successfully for all of this however he still has 1 remaining area Objective Constitutional Vitals Time Taken: 3:27 PM, Height: 71 in, Weight: 180 lbs, BMI: 25.1, Temperature: 98.0 F, Pulse: 73 bpm, Respiratory Rate: 16 breaths/min, Blood Pressure: 120/82 mmHg. Eyes Conjunctivae clear. No discharge. Respiratory Respiratory effort is easy and symmetric bilaterally. Rate is normal at rest and on room air.. Cardiovascular Pedal pulses palpable and strong bilaterally.. Extremities are free of varicosities, clubbing or edema. Peripheral pulses strong and equal. Capillary refill < 3 seconds.. Lymphatic Car, Sebert A. ([id number]) none palpable in the popliteal or inguinal area. General Notes: wound exam; the patient's left foot is resolved. His skin is dry and this may represent continued tinea colonization of this area. He tells me he is moisturizing his feet.the only concerning area that remains is in the fourth fifth toe web space on the right. Still what looks to be tinea pedis with threatened ulceration. I am not certain why this area did not resolve along with the rest of the  extensive disease. The severe cellulitis on the left foot has resolved loss of surface epithelium has been replaced Integumentary (Hair, Skin) and the extensive skin damage in his left foot predominantly is all closed over only one area remains on the right is noted. Wound #1 status is Open. Original cause of wound was Gradually Appeared. The wound is located on the Right Toe - Web between 4th and 5th. The wound measures 0.5cm length x 0.7cm width x 0.1cm depth; 0.275cm^2 area and 0.027cm^3 volume. Wound #2 status is Healed - Epithelialized. Original cause of wound was Gradually Appeared. The wound is located on the Left,Circumferential Foot. The wound measures 0cm length x 0cm width x 0cm depth; 0cm^2 area and 0cm^3 volume. Assessment Active Problems ICD-10 L97.522 - Non-pressure chronic ulcer of other part of left foot with fat layer exposed L03.116 - Cellulitis of left lower limb B35.3 - Tinea pedis Plan The following medication(s) was prescribed: ketoconazole topical 2 % cream cream topical to affected area starting 10/02/2017 #1 I escribed topical ketoconazole to the area still was silver alginate between his toes #2 I think the patient can go back to work, life first saw the his feet predominantly the left thigh thought about hospitalization however that was not necessary. He had severe tinea pedis with secondary cellulitis fortunately we're able to catch this in time Electronic Signature(s) Signed: 10/02/2017 4:56:29 PM By: Linton Ham MD Entered By: Linton Ham on 10/02/2017 16:44:32 Kirs[place]baum, [place]. ([id number]) [place], [place]. ([id number]) -------------------------------------------------------------------------------- SuperBill Details Patient Name: [name], Gustavo A. Date of Service: 10/02/2017 Medical Record Number: [id number] Patient Account Number: [id number] Date of Birth/Sex: 10-08-1970 (47 y.o. Male) Treating RN: Cornell Barman Primary Care Provider: PATIENT, NO Other  Clinician: Referring  Provider: Harvest Dark Treating Provider/Extender: Tito Dine in Treatment: 4 Diagnosis Coding ICD-10 Codes Code Description [id number] Non-pressure chronic ulcer of other part of left foot with fat layer exposed L03.116 Cellulitis of left lower limb B35.3 Tinea pedis Facility Procedures CPT4 Code: [id number] Description: 99213 - WOUND CARE VISIT-LEV 3 EST PT Modifier: Quantity: 1 Physician Procedures CPT4 Code: [id number] Description: 38706 - WC PHYS LEVEL 3 - EST PT ICD-10 Diagnosis Description L97.522 Non-pressure chronic ulcer of other part of left foot with fa Modifier: t layer exposed Quantity: 1 Electronic Signature(s) Signed: 10/02/2017 4:56:29 PM By: Linton Ham MD Entered By: Linton Ham on 10/02/2017 16:44:50

## 2017-10-03 NOTE — Progress Notes (Signed)
FLORA, RATZ ([id number]) Visit Report for 10/02/2017 Arrival Information Details Patient Name: [name], [name] Date of Service: 10/02/2017 3:15 PM Medical Record Number: [id number] Patient Account Number: [id number] Date of Birth/Sex: 1971-05-27 (47 y.o. Male) Treating [name]: [name] Primary Care [name]: PATIENT, NO Other Clinician: Referring [name]: [name] Treating [name]/Extender: [name] in Treatment: 4 Visit Information History Since Last Visit Added or deleted any medications: No Patient Arrived: Ambulatory Any new allergies or adverse reactions: No Arrival Time: 15:27 Had [name] fall or experienced change in No Accompanied By: friend activities of daily living that may affect Transfer Assistance: None risk of falls: Patient Identification Verified: Yes Signs or symptoms of abuse/neglect since last visito No Secondary Verification Process Completed: Yes Hospitalized since last visit: No Patient Requires Transmission-Based No Has Dressing in Place as Prescribed: No Precautions: Pain Present Now: Yes Patient Has Alerts: No Electronic Signature(s) Signed: 10/02/2017 4:27:21 PM By: [name], BSN, [name], CWS, [name], BSN Entered By: [name], BSN, [name], CWS, Marc on 10/02/2017 15:27:23 [name], [name] ([id number]) -------------------------------------------------------------------------------- Clinic Level of Care Assessment Details Patient Name: [name] Date of Service: 10/02/2017 3:15 PM Medical Record Number: [id number] Patient Account Number: [id number] Date of Birth/Sex: [birth year] (47 y.o. Male) Treating [name]: [name] Primary Care [name]: PATIENT, NO Other Clinician: Referring [name]: [name] Treating [name]/Extender: [name] in Treatment: 4 Clinic Level of Care Assessment Items TOOL 4 Quantity Score []  - Use when only an EandM is performed on FOLLOW-UP visit 0 ASSESSMENTS - Nursing Assessment / Reassessment []  -  Reassessment of Co-morbidities (includes updates in patient status) 0 X- 1 5 Reassessment of Adherence to Treatment Plan ASSESSMENTS - Wound and Skin Assessment / Reassessment []  - Simple Wound Assessment / Reassessment - one wound 0 X- 2 5 Complex Wound Assessment / Reassessment - multiple wounds []  - 0 Dermatologic / Skin Assessment (not related to wound area) ASSESSMENTS - Focused Assessment []  - Circumferential Edema Measurements - multi extremities 0 []  - 0 Nutritional Assessment / Counseling / Intervention []  - 0 Lower Extremity Assessment (monofilament, tuning fork, pulses) []  - 0 Peripheral Arterial Disease Assessment (using hand held doppler) ASSESSMENTS - Ostomy and/or Continence Assessment and Care []  - Incontinence Assessment and Management 0 []  - 0 Ostomy Care Assessment and Management (repouching, etc.) PROCESS - Coordination of Care X - Simple Patient / Family Education for ongoing care 1 15 []  - 0 Complex (extensive) Patient / Family Education for ongoing care X- 1 10 Staff obtains Chiropractor, Records, Test Results / Process Orders []  - 0 Staff telephones HHA, Nursing Homes / Clarify orders / etc []  - 0 Routine Transfer to another Facility (non-emergent condition) []  - 0 Routine Hospital Admission (non-emergent condition) []  - 0 New Admissions / Manufacturing engineer / Ordering NPWT, Apligraf, etc. []  - 0 Emergency Hospital Admission (emergent condition) X- 1 10 Simple Discharge Coordination Eye, [name] ([id number]) []  - 0 Complex (extensive) Discharge Coordination PROCESS - Special Needs []  - Pediatric / Minor Patient Management 0 []  - 0 Isolation Patient Management []  - 0 Hearing / Language / Visual special needs []  - 0 Assessment of Community assistance (transportation, D/C planning, etc.) []  - 0 Additional assistance / Altered mentation []  - 0 Support Surface(s) Assessment (bed, cushion, seat, etc.) INTERVENTIONS - Wound Cleansing /  Measurement X - Simple Wound Cleansing - one wound 1 5 []  - 0 Complex Wound Cleansing - multiple wounds X- 1 5 Wound Imaging (photographs - any number  of wounds) []  - 0 Wound Tracing (instead of photographs) X- 1 5 Simple Wound Measurement - one wound []  - 0 Complex Wound Measurement - multiple wounds INTERVENTIONS - Wound Dressings X - Small Wound Dressing one or multiple wounds 1 10 []  - 0 Medium Wound Dressing one or multiple wounds []  - 0 Large Wound Dressing one or multiple wounds []  - 0 Application of Medications - topical []  - 0 Application of Medications - injection INTERVENTIONS - Miscellaneous []  - External ear exam 0 []  - 0 Specimen Collection (cultures, biopsies, blood, body fluids, etc.) []  - 0 Specimen(s) / Culture(s) sent or taken to Lab for analysis []  - 0 Patient Transfer (multiple staff / Nurse, adult / Similar devices) []  - 0 Simple Staple / Suture removal (25 or less) []  - 0 Complex Staple / Suture removal (26 or more) []  - 0 Hypo / Hyperglycemic Management (close monitor of Blood Glucose) []  - 0 Ankle / Brachial Index (ABI) - do not check if billed separately X- 1 5 Vital Signs [name], [name] ([id number]) Has the patient been seen at the hospital within the last three years: Yes Total Score: 80 Level Of Care: New/Established - Level 3 Electronic Signature(s) Signed: 10/02/2017 4:27:21 PM By: [name], BSN, [name], CWS, [name], BSN Entered By: [name], BSN, [name], CWS, Marc on 10/02/2017 15:49:15 [name], [name] ([id number]) -------------------------------------------------------------------------------- Encounter Discharge Information Details Patient Name: [name], [name] Date of Service: 10/02/2017 3:15 PM Medical Record Number: [id number] Patient Account Number: [id number] Date of Birth/Sex: January 02, 1971 (47 y.o. Male) Treating [name]: [name] Primary Care [name]: PATIENT, NO Other Clinician: Referring [name]: [name] Treating [name]/Extender:  [name] in Treatment: 4 Encounter Discharge Information Items Discharge Pain Level: 0 Discharge Condition: Stable Ambulatory Status: Ambulatory Discharge Destination: Home Transportation: Private Auto Accompanied By: friend Schedule Follow-up Appointment: Yes Medication Reconciliation completed and Yes provided to Patient/Care Natsumi Whitsitt: Provided on Clinical Summary of Care: 10/02/2017 Form Type Recipient Paper Patient DC Electronic Signature(s) Signed: 10/03/2017 8:30:26 AM By: Gwenlyn Perking Entered By: Gwenlyn Perking on 10/02/2017 15:50:46 [name], [name] ([id number]) -------------------------------------------------------------------------------- Lower Extremity Assessment Details Patient Name: [name], [name] Date of Service: 10/02/2017 3:15 PM Medical Record Number: [id number] Patient Account Number: [id number] Date of Birth/Sex: 19-Dec-1970 (47 y.o. Male) Treating [name]: [name] Primary Care Teruo Stilley: PATIENT, NO Other Clinician: Referring Krystyl Cannell: [name] Treating Anja Neuzil/Extender: Marc [place] in Treatment: 4 Vascular Assessment Pulses: Dorsalis Pedis Palpable: [Left:Yes] [Right:Yes] Posterior Tibial Extremity colors, hair growth, and conditions: Extremity Color: [Left:Normal] [Right:Normal] Hair Growth on Extremity: [Left:Yes] [Right:Yes] Temperature of Extremity: [Left:Warm] [Right:Warm] Capillary Refill: [Left:< 3 seconds] [Right:< 3 seconds] Toe Nail Assessment Left: Right: Thick: Yes Yes Discolored: Yes Yes Deformed: Yes Yes Improper Length and Hygiene: No No Electronic Signature(s) Signed: 10/02/2017 4:27:21 PM By: [name], BSN, [name], CWS, [name], BSN Entered By: [name], BSN, [name], CWS, Marc on 10/02/2017 15:33:31 [name], [name] ([id number]) -------------------------------------------------------------------------------- Multi Wound Chart Details Patient Name: [name], [name] Date of Service: 10/02/2017 3:15 PM Medical Record  Number: [id number] Patient Account Number: [id number] Date of Birth/Sex: May 31, 1971 (47 y.o. Male) Treating [name]: [name] Primary Care Karl Knarr: PATIENT, NO Other Clinician: Referring Stephon Weathers: [name] Treating Nathen Balaban/Extender: Marc  in Treatment: 4 Vital Signs Height(in): 71 Pulse(bpm): 73 Weight(lbs): 180 Blood Pressure(mmHg): 120/82 Body Mass Index(BMI): 25 Temperature(F): 98.0 Respiratory Rate 16 (breaths/min): Photos: [N/[name]:N/[name]] Wound Location: Right, Plantar Foot Left, Circumferential Foot N/[name] Wounding Event: Gradually Appeared Gradually Appeared N/[name] Primary Etiology: Atypical  Atypical N/[name] Date Acquired: 08/05/2017 08/05/2017 N/[name] Weeks of Treatment: 4 4 N/[name] Wound Status: Open Healed - Epithelialized N/[name] Pending Amputation on Yes Yes N/[name] Presentation: Measurements L x W x D 0.5x0.7x0.1 0x0x0 N/[name] (cm) Area (cm) : 0.275 0 N/[name] Volume (cm) : 0.027 0 N/[name] % Reduction in Area: 89.20% 100.00% N/[name] % Reduction in Volume: 89.40% 100.00% N/[name] Classification: Partial Thickness Partial Thickness N/[name] Periwound Skin Texture: No Abnormalities Noted No Abnormalities Noted N/[name] Periwound Skin Moisture: No Abnormalities Noted No Abnormalities Noted N/[name] Periwound Skin Color: No Abnormalities Noted No Abnormalities Noted N/[name] Tenderness on Palpation: No No N/[name] Treatment Notes Wound #1 (Right Toe - Web between 4th and 5th) 1. Cleansed with: Clean wound with Normal Saline Alamo, Briyan [name]. ([id number]) 4. Dressing Applied: Other dressing (specify in notes) Notes silvercel in between 4th/5th right toes Electronic Signature(s) Signed: 10/02/2017 4:56:29 PM By: Baltazar Najjar MD Previous Signature: 10/02/2017 4:27:21 PM Version By: [name], BSN, [name], CWS, [name], BSN Entered By: Baltazar Najjar on 10/02/2017 16:40:33 [name], [name] ([id number]) -------------------------------------------------------------------------------- Multi-Disciplinary Care Plan  Details Patient Name: [name], [name] Date of Service: 10/02/2017 3:15 PM Medical Record Number: [id number] Patient Account Number: [id number] Date of Birth/Sex: 10/09/1970 (47 y.o. Male) Treating [name]: [name] Primary Care Artemis Koller: PATIENT, NO Other Clinician: Referring Alvita Fana: [name] Treating Thijs Brunton/Extender: Marc  in Treatment: 4 Active Inactive ` Abuse / Safety / Falls / Self Care Management Nursing Diagnoses: Potential for falls Goals: Patient will not experience any injury related to falls Date Initiated: 09/04/2017 Target Resolution Date: 12/21/2017 Goal Status: Active Interventions: Assess impairment of mobility on admission and as needed per policy Assess personal safety and home safety (as indicated) on admission and as needed Assess self care needs on admission and as needed Provide education on basic hygiene Provide education on fall prevention Treatment Activities: Education provided on Basic Hygiene : 09/04/2017 Notes: ` Nutrition Nursing Diagnoses: Imbalanced nutrition Potential for alteratiion in Nutrition/Potential for imbalanced nutrition Goals: Patient/caregiver agrees to and verbalizes understanding of need to use nutritional supplements and/or vitamins as prescribed Date Initiated: 09/04/2017 Target Resolution Date: 12/21/2017 Goal Status: Active Interventions: Assess patient nutrition upon admission and as needed per policy Notes: ` Orientation to the Wound Care Program [place], Itzae [name]. ([id number]) Nursing Diagnoses: Knowledge deficit related to the wound healing center program Goals: Patient/caregiver will verbalize understanding of the Wound Healing Center Program Date Initiated: 09/04/2017 Target Resolution Date: 09/21/2017 Goal Status: Active Interventions: Provide education on orientation to the wound center Notes: ` Pain, Acute or Chronic Nursing Diagnoses: Pain, acute or chronic: actual or  potential Potential alteration in comfort, pain Goals: Patient/caregiver will verbalize adequate pain control between visits Date Initiated: 09/04/2017 Target Resolution Date: 11/23/2017 Goal Status: Active Interventions: Complete pain assessment as per visit requirements Notes: ` Soft Tissue Infection Nursing Diagnoses: Impaired tissue integrity Knowledge deficit related to disease process and management Knowledge deficit related to home infection control: handwashing, handling of soiled dressings, supply storage Potential for infection: soft tissue Goals: Patient/caregiver will verbalize understanding of or measures to prevent infection and contamination in the home setting Date Initiated: 09/04/2017 Target Resolution Date: 11/23/2017 Goal Status: Active Signs and symptoms of infection will be recognized early to allow for prompt treatment Date Initiated: 09/04/2017 Target Resolution Date: 12/21/2017 Goal Status: Active Interventions: Assess signs and symptoms of infection every visit Provide education on infection Treatment Activities: Education provided on Infection : 09/04/2017 Bounds, Cung [name]. ([id number]) Notes: ` Wound/Skin Impairment Nursing Diagnoses: Impaired tissue  integrity Knowledge deficit related to smoking impact on wound healing Knowledge deficit related to ulceration/compromised skin integrity Goals: Ulcer/skin breakdown will have [name] volume reduction of 80% by week 12 Date Initiated: 09/04/2017 Target Resolution Date: 12/21/2017 Goal Status: Active Interventions: Assess patient/caregiver ability to perform ulcer/skin care regimen upon admission and as needed Assess ulceration(s) every visit Provide education on smoking Provide education on ulcer and skin care Notes: Electronic Signature(s) Signed: 10/02/2017 4:27:21 PM By: [name], BSN, [name], CWS, [name], BSN Entered By: [name], BSN, [name], CWS, Marc on 10/02/2017 15:35:19 Mainer, Yu [name].  ([id number]) -------------------------------------------------------------------------------- Pain Assessment Details Patient Name: [name], [name] Date of Service: 10/02/2017 3:15 PM Medical Record Number: [id number] Patient Account Number: [id number] Date of Birth/Sex: Aug 16, 1971 (47 y.o. Male) Treating [name]: [name] Primary Care Phylisha Dix: PATIENT, NO Other Clinician: Referring Ryland Smoots: [name] Treating Sarabelle Genson/Extender: Marc [place] in Treatment: 4 Active Problems Location of Pain Severity and Description of Pain Patient Has Paino No Site Locations With Dressing Change: No Rate the pain. Current Pain Level: 2 Character of Pain Describe the Pain: Aching Pain Management and Medication Current Pain Management: Goals for Pain Management Topical or injectable lidocaine is offered to patient for acute pain when surgical debridement is performed. If needed, Patient is instructed to use over the counter pain medication for the following 24-48 hours after debridement. Wound care MDs do not prescribed pain medications. Patient has chronic pain or uncontrolled pain. Patient has been instructed to make an appointment with their Primary Care Physician for pain management. Electronic Signature(s) Signed: 10/02/2017 4:27:21 PM By: [name], BSN, [name], CWS, [name], BSN Entered By: [name], BSN, [name], CWS, Marc on 10/02/2017 15:27:37 Birchard, Jentry AMarland Kitchen ([id number]) -------------------------------------------------------------------------------- Patient/Caregiver Education Details Patient Name: [name], [name] Date of Service: 10/02/2017 3:15 PM Medical Record Number: [id number] Patient Account Number: [id number] Date of Birth/Gender: 03-28-1971 (47 y.o. Male) Treating [name]: [name] Primary Care Physician: PATIENT, NO Other Clinician: Referring Physician: Minna [name] Treating Physician/Extender: Marc Lee in Treatment: 4 Education Assessment Education Provided  To: Patient Education Topics Provided Wound/Skin Impairment: Handouts: Caring for Your Ulcer, Other: continue cream as prescribed Methods: Demonstration, Explain/Verbal Responses: State content correctly Electronic Signature(s) Signed: 10/02/2017 4:27:21 PM By: [name], BSN, [name], CWS, [name], BSN Entered By: [name], BSN, [name], CWS, Marc on 10/02/2017 15:50:44 Bowell, Ashtian [name]. ([id number]) -------------------------------------------------------------------------------- Wound Assessment Details Patient Name: [name], Mckale [name]. Date of Service: 10/02/2017 3:15 PM Medical Record Number: [id number] Patient Account Number: [id number] Date of Birth/Sex: Jan 27, 1971 (47 y.o. Male) Treating [name]: [name] Primary Care Cana Mignano: PATIENT, NO Other Clinician: Referring Torunn Chancellor: [name] Treating Sheleen Conchas/Extender: Maxwell Caul Weeks in Treatment: 4 Wound Status Wound Number: 1 Primary Etiology: Atypical Wound Location: Right, Plantar Foot Wound Status: Open Wounding Event: Gradually Appeared Date Acquired: 08/05/2017 Weeks Of Treatment: 4 Clustered Wound: No Pending Amputation On Presentation Photos Photo Uploaded By: [name], BSN, [name], CWS, Marc on 10/02/2017 15:37:44 Wound Measurements Length: (cm) 0.5 Width: (cm) 0.7 Depth: (cm) 0.1 Area: (cm) 0.275 Volume: (cm) 0.027 % Reduction in Area: 89.2% % Reduction in Volume: 89.4% Wound Description Classification: Partial Thickness Periwound Skin Texture Texture Color No Abnormalities Noted: No No Abnormalities Noted: No Moisture No Abnormalities Noted: No Electronic Signature(s) Signed: 10/02/2017 4:27:21 PM By: [name], BSN, [name], CWS, [name], BSN Entered By: [name], BSN, [name], CWS, Marc on 10/02/2017 15:32:09 Fariss, Jamarkus [name]. ([id number]) -------------------------------------------------------------------------------- Wound Assessment Details Patient Name: [name], Woodley [name]. Date of Service: 10/02/2017 3:15 PM Medical Record Number:  [id number] Patient Account Number: [id number] Date of Birth/Sex: 01/13/1971 [age] y.o. Male) Treating [name]: Marc CoventryWoody, Marc Primary Care Colbe Viviano: PATIENT, NO Other Clinician: Referring Tiasia Weberg: Marc AntisPADUCHOWSKI, KEVIN Treating Fredrich Cory/Extender: Maxwell CaulOBSON, MICHAEL G Weeks in Treatment: 4 Wound Status Wound Number: 2 Primary Etiology: Atypical Wound Location: Left, Circumferential Foot Wound Status: Healed - Epithelialized Wounding Event: Gradually Appeared Date Acquired: 08/05/2017 Weeks Of Treatment: 4 Clustered Wound: No Pending Amputation On Presentation Photos Photo Uploaded By: Marc GurneyWoody, BSN, [name], CWS, Marc on 10/02/2017 15:37:45 Wound Measurements Length: (cm) 0 Width: (cm) 0 Depth: (cm) 0 Area: (cm) 0 Volume: (cm) 0 % Reduction in Area: 100% % Reduction in Volume: 100% Wound Description Classification: Partial Thickness Periwound Skin Texture Texture Color No Abnormalities Noted: No No Abnormalities Noted: No Moisture No Abnormalities Noted: No Electronic Signature(s) Signed: 10/02/2017 4:27:21 PM By: Marc GurneyWoody, BSN, [name], CWS, [name], BSN Entered By: Marc GurneyWoody, BSN, [name], CWS, Marc on 10/02/2017 15:32:10 Mcelroy, Lavonte [name]. ([id number]) -------------------------------------------------------------------------------- Vitals Details Patient Name: Marc DragonARR, Leverne [name]. Date of Service: 10/02/2017 3:15 PM Medical Record Number: [id number] Patient Account Number: [id number] Date of Birth/Sex: 03/05/1971 [age] y.o. Male) Treating [name]: Marc CoventryWoody, Marc Primary Care Brock Larmon: PATIENT, NO Other Clinician: Referring Amoni Morales: Marc AntisPADUCHOWSKI, KEVIN Treating Kearsten Ginther/Extender: Marc CarolinaOBSON, MICHAEL G Weeks in Treatment: 4 Vital Signs Time Taken: 15:27 Temperature (F): 98.0 Height (in): 71 Pulse (bpm): 73 Weight (lbs): 180 Respiratory Rate (breaths/min): 16 Body Mass Index (BMI): 25.1 Blood Pressure (mmHg): 120/82 Reference Range: 80 - 120 mg / dl Electronic Signature(s) Signed: 10/02/2017 4:27:21 PM By: Marc GurneyWoody, BSN, [name], CWS,  [name], BSN Entered By: Marc GurneyWoody, BSN, [name], CWS, Marc on 10/02/2017 15:27:55

## 2017-10-16 ENCOUNTER — Encounter: Payer: No Typology Code available for payment source | Admitting: Internal Medicine

## 2017-10-16 DIAGNOSIS — L97522 Non-pressure chronic ulcer of other part of left foot with fat layer exposed: Secondary | ICD-10-CM | POA: Diagnosis not present

## 2017-10-17 NOTE — Progress Notes (Signed)
BARUC, TUGWELL ([id number]) Visit Report for 10/16/2017 HPI Details Patient Name: [name], [name] Date of Service: 10/16/2017 3:15 PM Medical Record Number: [id number] Patient Account Number: [id number] Date of Birth/Sex: May 30, 1971 (47 y.o. Male) Treating RN: Roger Shelter Primary Care Provider: PATIENT, NO Other Clinician: Referring Provider: Harvest Dark Treating Provider/Extender: Tito Dine in Treatment: 6 History of Present Illness HPI Description: 09/04/17; this is a 47 year old man who was not a diabetic and has no known history of peripheral vascular disease. He does have hyperlipidemia and a history of cocaine abuse although he insists that this is not active. He tells me that about a month ago he started having denuded skin between the toes of his left foot and on the plantar aspect of his left foot. He was picking at the skin and pulling it off. More recently he has had involvement of the toes on the right foot. Week or 2 ago he started getting swelling and pain in the left foot and he was seen in the ER on 08/29/17 he was diagnosed with cellulitis. The only ulcer that they commented on was the left fifth toe. An x-ray suggested ulcer at the fifth MTP slightly osteopenic appearance of the fifth met head question early osteo. He has 14 days of Bactrim DS prescribed and the patient states the swelling and pain in the left foot is improved since starting this 6 days ago. ABIs in this clinic were 1.28 on the right and 1.3 on the left. The patient is a smoker at one half pack per day 09/11/17; patient came last week and in general I think he had severe tinea pedis with secondary bacterial cellulitis. He was on Septra DS and is completed this. I gave him oral terbinafine for the tinea pedis. A culture I did on the left foot last week grew Pseudomonas which is quinolone sensitive. I'll give him 10 days of ciprofloxacin today. However he arrives with his foot looking a lot  better. There is less erythema and less warmth 09/18/17; patient has completed this systemic antifungal therapy and antibiotics for secondary bacterial cellulitis. He is using Lotrimin between his toes and silver alginate. All of this looks a lot better 10/02/17;everything is cleaned up in the patient's bilateral feet except for the right fourth fifth toe web space. The patient had severe tinea pedis with secondary cellulitis. Multiple denuded areas between his toes. We have treated him successfully for all of this however he still has 1 remaining area 10/16/17; patient arrives after a 2 week hiatus he is back at work. Using Lotrimin cream and silver alginate between his toes. He tells me that in the last 2 days he's had denuded areas of skin that he is pulled off. There is no evidence of secondary infection or cellulitis. Not really a lot of evidence of tinea pedis between the toes either. Electronic Signature(s) Signed: 10/16/2017 4:49:25 PM By: Linton Ham MD Entered By: Linton Ham on 10/16/2017 16:35:51 Capozzi, Karston A. ([id number]) -------------------------------------------------------------------------------- Physical Exam Details Patient Name: [name], [name] Date of Service: 10/16/2017 3:15 PM Medical Record Number: [id number] Patient Account Number: [id number] Date of Birth/Sex: 1971/06/08 (47 y.o. Male) Treating RN: Roger Shelter Primary Care Provider: PATIENT, NO Other Clinician: Referring Provider: Harvest Dark Treating Provider/Extender: Ricard Dillon Weeks in Treatment: 6 Constitutional Sitting or standing Blood Pressure is within target range for patient.. Pulse regular and within target range for patient.Marland Kitchen Respirations regular, non-labored and within target range.. Temperature is normal and within the target  range for the patient.Marland Kitchen appears in no distress. Eyes Conjunctivae clear. No discharge. Respiratory Respiratory effort is easy and symmetric bilaterally.  Rate is normal at rest and on room air.. Cardiovascular Pedal pulses are briskly palpable. Lymphatic None palpable in the popliteal or inguinal area. Integumentary (Hair, Skin) Careful inspection of the skin is feet reveals no evidence of spreading cellulitis. Psychiatric No evidence of depression, anxiety, or agitation. Calm, cooperative, and communicative. Appropriate interactions and affect.. Notes When exam; he has extensive denuded areas in the left foot between the third and fourth and fourth and fifth toes especially the fourth and fifth these were all closed when I saw him 2 weeks ago. oIn almost a mirror image on the right foot he has an extensive open area between the fourth and fifth toes lesser between the third and fourth toes. I didn't really see concrete evidence and I've any area of actual tinea-type changes this looks like maceration and moisture Electronic Signature(s) Signed: 10/16/2017 4:49:25 PM By: Linton Ham MD Entered By: Linton Ham on 10/16/2017 16:38:43 Ehrman, Daiki A. ([id number]) -------------------------------------------------------------------------------- Physician Orders Details Patient Name: [name], Ordean A. Date of Service: 10/16/2017 3:15 PM Medical Record Number: [id number] Patient Account Number: [id number] Date of Birth/Sex: 07-09-1971 (47 y.o. Male) Treating RN: Roger Shelter Primary Care Provider: PATIENT, NO Other Clinician: Referring Provider: Harvest Dark Treating Provider/Extender: Tito Dine in Treatment: 6 Verbal / Phone Orders: No Diagnosis Coding Wound Cleansing Wound #1 Right Toe - Web between 4th and 5th o Clean wound with Normal Saline. Wound #3 Left Toe - Web between 4th and 5th o Clean wound with Normal Saline. Anesthetic (add to Medication List) Wound #1 Right Toe - Web between 4th and 5th o Topical Lidocaine 4% cream applied to wound bed prior to debridement (In Clinic Only). Wound #3 Left Toe  - Web between 4th and 5th o Topical Lidocaine 4% cream applied to wound bed prior to debridement (In Clinic Only). Primary Wound Dressing Wound #1 Right Toe - Web between 4th and 5th o Silvercel Non-Adherent - place between 3rd and 4th toes and 4th and 5th toes Wound #3 Left Toe - Web between 4th and 5th o Silvercel Non-Adherent - place between 3rd and 4th toes and 4th and 5th toes Secondary Dressing Wound #1 Right Toe - Web between 4th and 5th o Dry Gauze Wound #3 Left Toe - Web between 4th and 5th o Dry Gauze Dressing Change Frequency Wound #1 Right Toe - Web between 4th and 5th o Change dressing every day. Wound #3 Left Toe - Web between 4th and 5th o Change dressing every day. Follow-up Appointments Wound #1 Right Toe - Web between 4th and 5th o Return Appointment in 1 week. Wound #3 Left Toe - Web between 4th and 5th o Return Appointment in 1 week. Michaela, Shankel Thermon A. ([id number]) Electronic Signature(s) Signed: 10/16/2017 4:19:27 PM By: Roger Shelter Signed: 10/16/2017 4:49:25 PM By: Linton Ham MD Entered By: Roger Shelter on 10/16/2017 15:49:49 Brys, Wyn A. ([id number]) -------------------------------------------------------------------------------- Problem List Details Patient Name: [name], Khy A. Date of Service: 10/16/2017 3:15 PM Medical Record Number: [id number] Patient Account Number: [id number] Date of Birth/Sex: [birth year] (47 y.o. 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 ([id number]) -------------------------------------------------------------------------------- Progress Note Details Patient Name: [name], Eutimio A. Date of Service: 10/16/2017 3:15 PM Medical Record Number: [id number] Patient Account Number: [id number] Date of Birth/Sex: 1970-10-18 (47 y.o. Male) Treating RN: Roger Shelter Primary Care Provider: PATIENT, NO Other Clinician: Referring Provider: Harvest Dark Treating Provider/Extender: Ricard Dillon Weeks in Treatment: 6 Subjective History of Present Illness (HPI) 09/04/17; this is a 47 year old man who was not a diabetic and has no known history of peripheral vascular disease. He does have hyperlipidemia and a history of cocaine abuse although he insists that this is not active. He tells me that about a month ago he started having denuded skin between the toes of his left foot and on the plantar aspect of his left foot. He was picking at the skin and pulling it off. More recently he has had involvement of the toes on the right foot. Week or 2 ago he started getting swelling and pain in the left foot and he was seen in the ER on 08/29/17 he was diagnosed with cellulitis. The only ulcer that they commented on was the left fifth toe. An x-ray suggested ulcer at the fifth MTP slightly osteopenic appearance of the fifth met head question early osteo. He has 14 days of Bactrim DS prescribed and the patient states the swelling and pain in the left foot is improved since starting this 6 days ago. ABIs in this clinic were 1.28 on the right and 1.3 on the left. The patient is a smoker at one half pack per day 09/11/17; patient came last week and in general I think he had severe tinea pedis with secondary bacterial cellulitis. He was on Septra DS and is completed this. I gave him oral terbinafine for the tinea pedis.  A culture I did on the left foot last week grew Pseudomonas which is quinolone sensitive. I'll give him 10 days of ciprofloxacin today. However he arrives with his foot looking a lot better. There is less erythema and less warmth 09/18/17; patient has completed this systemic antifungal therapy and antibiotics for secondary bacterial cellulitis. He is using Lotrimin between his toes and silver alginate. All of this looks a lot better 10/02/17;everything is cleaned up in the patient's bilateral feet except for the right fourth fifth toe web space. The patient had severe tinea pedis with secondary cellulitis. Multiple denuded areas between his toes. We have treated him successfully for all of this however he still has 1 remaining area 10/16/17; patient arrives after a 2 week hiatus he is back at work. Using Lotrimin cream and silver alginate between his toes. He tells me that in the last 2 days he's had denuded areas of skin that he is pulled off. There is no evidence of secondary infection or cellulitis. Not really a lot of evidence of tinea pedis between the toes either. Objective Constitutional Sitting or standing Blood Pressure is within target range for patient.. Pulse regular and within target range for patient.Marland Kitchen Respirations regular, non-labored and within target range.. Temperature is normal and within the target range for the patient.Marland Kitchen appears in no distress. Vitals Time Taken: 3:09 AM, Height: 71 in, Weight: 180 lbs, BMI: 25.1, Temperature: 98.6 F, Pulse: 79 bpm, Respiratory Rate: 16 breaths/min, Blood Pressure: 127/86 mmHg. Eyes Conjunctivae clear. No discharge. Respiratory Riddell, Coston A. ([id number]) Respiratory  effort is easy and symmetric bilaterally. Rate is normal at rest and on room air.. Cardiovascular Pedal pulses are briskly palpable. Lymphatic None palpable in the popliteal or inguinal area. Psychiatric No evidence of depression, anxiety, or agitation. Calm, cooperative,  and communicative. Appropriate interactions and affect.. General Notes: When exam; he has extensive denuded areas in the left foot between the third and fourth and fourth and fifth toes especially the fourth and fifth these were all closed when I saw him 2 weeks ago. In almost a mirror image on the right foot he has an extensive open area between the fourth and fifth toes lesser between the third and fourth toes. I didn't really see concrete evidence and I've any area of actual tinea-type changes this looks like maceration and moisture Integumentary (Hair, Skin) Careful inspection of the skin is feet reveals no evidence of spreading cellulitis. Wound #1 status is Open. Original cause of wound was Gradually Appeared. The wound is located on the Right Toe - Web between 4th and 5th. The wound measures 1.5cm length x 1cm width x 0.1cm depth; 1.178cm^2 area and 0.118cm^3 volume. There is no tunneling or undermining noted. There is a small amount of serosanguineous drainage noted. The wound margin is flat and intact. There is large (67-100%) red granulation within the wound bed. There is a small (1-33%) amount of necrotic tissue within the wound bed including Adherent Slough. The periwound skin appearance exhibited: Excoriation, Induration. The periwound skin appearance did not exhibit: Callus, Crepitus, Rash, Scarring, Dry/Scaly, Maceration, Atrophie Blanche, Cyanosis, Ecchymosis, Hemosiderin Staining, Mottled, Pallor, Rubor, Erythema. Wound #3 status is Open. Original cause of wound was Pressure Injury. The wound is located on the Left Toe - Web between 4th and 5th. The wound measures 2.1cm length x 2cm width x 0.1cm depth; 3.299cm^2 area and 0.33cm^3 volume. There is Fat Layer (Subcutaneous Tissue) Exposed exposed. There is no tunneling or undermining noted. There is a medium amount of serosanguineous drainage noted. The wound margin is flat and intact. There is large (67-100%) red granulation within  the wound bed. There is no necrotic tissue within the wound bed. The periwound skin appearance did not exhibit: Callus, Crepitus, Excoriation, Induration, Rash, Scarring, Dry/Scaly, Maceration, Atrophie Blanche, Cyanosis, Ecchymosis, Hemosiderin Staining, Mottled, Pallor, Rubor, Erythema. Assessment Active Problems ICD-10 L97.522 - Non-pressure chronic ulcer of other part of left foot with fat layer exposed L03.116 - Cellulitis of left lower limb B35.3 - Tinea pedis Plan Wound Cleansing: Wound #1 Right Toe - Web between 4th and 5th: Clean wound with Normal Saline. Karam, Darean A. ([id number]) Wound #3 Left Toe - Web between 4th and 5th: Clean wound with Normal Saline. Anesthetic (add to Medication List): Wound #1 Right Toe - Web between 4th and 5th: Topical Lidocaine 4% cream applied to wound bed prior to debridement (In Clinic Only). Wound #3 Left Toe - Web between 4th and 5th: Topical Lidocaine 4% cream applied to wound bed prior to debridement (In Clinic Only). Primary Wound Dressing: Wound #1 Right Toe - Web between 4th and 5th: Silvercel Non-Adherent - place between 3rd and 4th toes and 4th and 5th toes Wound #3 Left Toe - Web between 4th and 5th: Silvercel Non-Adherent - place between 3rd and 4th toes and 4th and 5th toes Secondary Dressing: Wound #1 Right Toe - Web between 4th and 5th: Dry Gauze Wound #3 Left Toe - Web between 4th and 5th: Dry Gauze Dressing Change Frequency: Wound #1 Right Toe - Web between 4th and 5th: Change  dressing every day. Wound #3 Left Toe - Web between 4th and 5th: Change dressing every day. Follow-up Appointments: Wound #1 Right Toe - Web between 4th and 5th: Return Appointment in 1 week. Wound #3 Left Toe - Web between 4th and 5th: Return Appointment in 1 week. o #1 I've stop the use of antifungal cream this may be contributing to excess moisture #2 silver alginate and gauze #3 I've counseled that the toes affected need to be separated  even in his work boots #4 SEE this back in a week to see how it looks 5 NO evidence of cellulits Electronic Signature(s) Signed: 10/16/2017 4:49:25 PM By: Linton Ham MD Entered By: Linton Ham on 10/16/2017 16:40:14 Reap, Jasiri A. ([id number]) -------------------------------------------------------------------------------- SuperBill Details Patient Name: [name], Jamarcus A. Date of Service: 10/16/2017 Medical Record Number: [id number] Patient Account Number: [id number] Date of Birth/Sex: 07-27-1971 (47 y.o. Male) Treating RN: Roger Shelter Primary Care Provider: PATIENT, NO Other Clinician: Referring Provider: Harvest Dark Treating Provider/Extender: Tito Dine in Treatment: 6 Diagnosis Coding ICD-10 Codes Code Description [id number] Non-pressure chronic ulcer of other part of left foot with fat layer exposed L03.116 Cellulitis of left lower limb B35.3 Tinea pedis Facility Procedures CPT4 Code: [id number] Description: 77824 - WOUND CARE VISIT-LEV 3 EST PT Modifier: Quantity: 1 Physician Procedures CPT4 Code: 2353614 Description: 43154 - WC PHYS LEVEL 3 - EST PT ICD-10 Diagnosis Description L97.522 Non-pressure chronic ulcer of other part of left foot with fa Modifier: t layer exposed Quantity: 1 Electronic Signature(s) Signed: 10/16/2017 4:49:25 PM By: Linton Ham MD Entered By: Linton Ham on 10/16/2017 16:40:44

## 2017-10-17 NOTE — Progress Notes (Signed)
[name], Macklen A. ([id number]) Visit Report for 10/16/2017 Arrival Information Details Patient Name: [name], [name] Date of Service: 10/16/2017 3:15 PM Medical Record Number: [id number] Patient Account Number: [id number] Date of Birth/Sex: 02/22/1971 [age] y.o. Male) Treating RN: Renne CriglerFlinchum, Cheryl Primary Care Natha Guin: PATIENT, NO Other Clinician: Referring Allex Madia: Minna AntisPADUCHOWSKI, KEVIN Treating Raelie Lohr/Extender: Altamese CarolinaOBSON, MICHAEL G Weeks in Treatment: 6 Visit Information History Since Last Visit All ordered tests and consults were completed: No Patient Arrived: Ambulatory Added or deleted any medications: No Arrival Time: 15:07 Any new allergies or adverse reactions: No Accompanied By: self Had a fall or experienced change in No Transfer Assistance: None activities of daily living that may affect Patient Requires Transmission-Based No risk of falls: Precautions: Signs or symptoms of abuse/neglect since last visito No Patient Has Alerts: No Hospitalized since last visit: No Pain Present Now: Yes Electronic Signature(s) Signed: 10/16/2017 4:19:27 PM By: Renne CriglerFlinchum, Cheryl Entered By: Renne CriglerFlinchum, Cheryl on 10/16/2017 15:08:32 [name], Pius A. ([id number]) -------------------------------------------------------------------------------- Clinic Level of Care Assessment Details Patient Name: Marc DragonARR, [name] Date of Service: 10/16/2017 3:15 PM Medical Record Number: [id number] Patient Account Number: [id number] Date of Birth/Sex: 07/15/1971 [age] y.o. Male) Treating RN: Renne CriglerFlinchum, Cheryl Primary Care Lelynd Poer: PATIENT, NO Other Clinician: Referring Teran Daughenbaugh: Minna AntisPADUCHOWSKI, KEVIN Treating Barbarita Hutmacher/Extender: Altamese CarolinaOBSON, MICHAEL G Weeks in Treatment: 6 Clinic Level of Care Assessment Items TOOL 4 Quantity Score []  - Use when only an EandM is performed on FOLLOW-UP visit 0 ASSESSMENTS - Nursing Assessment / Reassessment []  - Reassessment of Co-morbidities (includes updates in patient status) 0 X- 1  5 Reassessment of Adherence to Treatment Plan ASSESSMENTS - Wound and Skin Assessment / Reassessment []  - Simple Wound Assessment / Reassessment - one wound 0 X- 2 5 Complex Wound Assessment / Reassessment - multiple wounds []  - 0 Dermatologic / Skin Assessment (not related to wound area) ASSESSMENTS - Focused Assessment []  - Circumferential Edema Measurements - multi extremities 0 []  - 0 Nutritional Assessment / Counseling / Intervention []  - 0 Lower Extremity Assessment (monofilament, tuning fork, pulses) []  - 0 Peripheral Arterial Disease Assessment (using hand held doppler) ASSESSMENTS - Ostomy and/or Continence Assessment and Care []  - Incontinence Assessment and Management 0 []  - 0 Ostomy Care Assessment and Management (repouching, etc.) PROCESS - Coordination of Care []  - Simple Patient / Family Education for ongoing care 0 X- 1 20 Complex (extensive) Patient / Family Education for ongoing care []  - 0 Staff obtains ChiropractorConsents, Records, Test Results / Process Orders []  - 0 Staff telephones HHA, Nursing Homes / Clarify orders / etc []  - 0 Routine Transfer to another Facility (non-emergent condition) []  - 0 Routine Hospital Admission (non-emergent condition) []  - 0 New Admissions / Manufacturing engineernsurance Authorizations / Ordering NPWT, Apligraf, etc. []  - 0 Emergency Hospital Admission (emergent condition) []  - 0 Simple Discharge Coordination [name], [name] ([id number]) X- 1 15 Complex (extensive) Discharge Coordination PROCESS - Special Needs []  - Pediatric / Minor Patient Management 0 []  - 0 Isolation Patient Management []  - 0 Hearing / Language / Visual special needs []  - 0 Assessment of Community assistance (transportation, D/C planning, etc.) []  - 0 Additional assistance / Altered mentation []  - 0 Support Surface(s) Assessment (bed, cushion, seat, etc.) INTERVENTIONS - Wound Cleansing / Measurement []  - Simple Wound Cleansing - one wound 0 X- 2 5 Complex Wound  Cleansing - multiple wounds []  - 0 Wound Imaging (photographs - any number of wounds) X- 1 5 Wound Tracing (instead of photographs) []  - 0 Simple Wound Measurement - one  wound X- 2 5 Complex Wound Measurement - multiple wounds INTERVENTIONS - Wound Dressings X - Small Wound Dressing one or multiple wounds 2 10 []  - 0 Medium Wound Dressing one or multiple wounds []  - 0 Large Wound Dressing one or multiple wounds []  - 0 Application of Medications - topical []  - 0 Application of Medications - injection INTERVENTIONS - Miscellaneous []  - External ear exam 0 []  - 0 Specimen Collection (cultures, biopsies, blood, body fluids, etc.) []  - 0 Specimen(s) / Culture(s) sent or taken to Lab for analysis []  - 0 Patient Transfer (multiple staff / Nurse, adult / Similar devices) []  - 0 Simple Staple / Suture removal (25 or less) []  - 0 Complex Staple / Suture removal (26 or more) []  - 0 Hypo / Hyperglycemic Management (close monitor of Blood Glucose) []  - 0 Ankle / Brachial Index (ABI) - do not check if billed separately X- 1 5 Vital Signs Shor, Damarius A. ([id number]) Has the patient been seen at the hospital within the last three years: Yes Total Score: 100 Level Of Care: New/Established - Level 3 Electronic Signature(s) Signed: 10/16/2017 4:19:27 PM By: Renne Crigler Entered By: Renne Crigler on 10/16/2017 15:50:26 Schrecengost, Adewale A. ([id number]) -------------------------------------------------------------------------------- Encounter Discharge Information Details Patient Name: [name], [name] Date of Service: 10/16/2017 3:15 PM Medical Record Number: [id number] Patient Account Number: [id number] Date of Birth/Sex: 02-13-1971 (47 y.o. Male) Treating RN: Renne Crigler Primary Care [name]: PATIENT, NO Other Clinician: Referring [name]: Minna Antis Treating Lea Baine/Extender: Altamese [place] in Treatment: 6 Encounter Discharge Information Items Discharge Pain  Level: 0 Discharge Condition: Stable Ambulatory Status: Ambulatory Discharge Destination: Home Private Transportation: Auto Accompanied By: self Schedule Follow-up Appointment: Yes Medication Reconciliation completed and provided No to Patient/Care Jayle Solarz: Clinical Summary of Care: Electronic Signature(s) Signed: 10/16/2017 4:19:27 PM By: Renne Crigler Entered By: Renne Crigler on 10/16/2017 15:52:12 Gaede, Amron A. ([id number]) -------------------------------------------------------------------------------- Lower Extremity Assessment Details Patient Name: [name], Travian A. Date of Service: 10/16/2017 3:15 PM Medical Record Number: [id number] Patient Account Number: [id number] Date of Birth/Sex: 04/10/1971 (47 y.o. Male) Treating RN: Renne Crigler Primary Care Taryne Kiger: PATIENT, NO Other Clinician: Referring Neylan Koroma: Minna Antis Treating Thanya Cegielski/Extender: Maxwell Caul Weeks in Treatment: 6 Edema Assessment Assessed: [Left: No] [Right: No] Edema: [Left: No] [Right: No] Vascular Assessment Claudication: Claudication Assessment [Left:None] [Right:None] Pulses: Dorsalis Pedis Palpable: [Left:Yes] [Right:Yes] Posterior Tibial Extremity colors, hair growth, and conditions: Extremity Color: [Left:Normal] [Right:Normal] Hair Growth on Extremity: [Left:Yes] [Right:Yes] Temperature of Extremity: [Left:Cool] [Right:Cool] Capillary Refill: [Left:< 3 seconds] [Right:< 3 seconds] Toe Nail Assessment Left: Right: Thick: No No Discolored: No No Deformed: No No Improper Length and Hygiene: No No Electronic Signature(s) Signed: 10/16/2017 4:19:27 PM By: Renne Crigler Entered By: Renne Crigler on 10/16/2017 15:18:05 Zacher, Devyn A. ([id number]) -------------------------------------------------------------------------------- Multi Wound Chart Details Patient Name: [name], May A. Date of Service: 10/16/2017 3:15 PM Medical Record Number: [id number] Patient Account  Number: [id number] Date of Birth/Sex: [birth year] (47 y.o. Male) Treating RN: Renne Crigler Primary Care Kionte Baumgardner: PATIENT, NO Other Clinician: Referring Jamyson Jirak: Minna Antis Treating Valeria Krisko/Extender: Altamese  in Treatment: 6 Vital Signs Height(in): 71 Pulse(bpm): 79 Weight(lbs): 180 Blood Pressure(mmHg): 127/86 Body Mass Index(BMI): 25 Temperature(F): 98.6 Respiratory Rate 16 (breaths/min): Photos: [N/A:N/A] Wound Location: Right Toe - Web between 4th Left Toe - Web between 4th N/A and 5th and 5th Wounding Event: Gradually Appeared Pressure Injury N/A Primary Etiology: Atypical Pressure Ulcer N/A Date Acquired: 08/05/2017 10/13/2017 N/A Weeks of Treatment: 6 0 N/A Wound Status:  Open Open N/A Pending Amputation on Yes No N/A Presentation: Measurements L x W x D 1.5x1x0.1 2.1x2x0.1 N/A (cm) Area (cm) : 1.178 3.299 N/A Volume (cm) : 0.118 0.33 N/A % Reduction in Area: 53.70% N/A N/A % Reduction in Volume: 53.50% N/A N/A Classification: Partial Thickness Category/Stage II N/A Exudate Amount: Small Medium N/A Exudate Type: Serosanguineous Serosanguineous N/A Exudate Color: red, brown red, brown N/A Wound Margin: Flat and Intact Flat and Intact N/A Granulation Amount: Large (67-100%) Large (67-100%) N/A Granulation Quality: Red Red N/A Necrotic Amount: Small (1-33%) None Present (0%) N/A Exposed Structures: Fascia: No Fat Layer (Subcutaneous N/A Fat Layer (Subcutaneous Tissue) Exposed: Yes Tissue) Exposed: No Fascia: No Tendon: No Tendon: No Muscle: No Muscle: No Spilker, Michaelpaul A. ([id number]) Joint: No Joint: No Bone: No Bone: No Epithelialization: None None N/A Periwound Skin Texture: Excoriation: Yes Excoriation: No N/A Induration: Yes Induration: No Callus: No Callus: No Crepitus: No Crepitus: No Rash: No Rash: No Scarring: No Scarring: No Periwound Skin Moisture: Maceration: No Maceration: No N/A Dry/Scaly:  No Dry/Scaly: No Periwound Skin Color: Atrophie Blanche: No Atrophie Blanche: No N/A Cyanosis: No Cyanosis: No Ecchymosis: No Ecchymosis: No Erythema: No Erythema: No Hemosiderin Staining: No Hemosiderin Staining: No Mottled: No Mottled: No Pallor: No Pallor: No Rubor: No Rubor: No Tenderness on Palpation: No No N/A Wound Preparation: Ulcer Cleansing: Ulcer Cleansing: N/A Rinsed/Irrigated with Saline Rinsed/Irrigated with Saline Topical Anesthetic Applied: Topical Anesthetic Applied: Other: lidocaine 4% Other: lidocaine 4% Treatment Notes Wound #1 (Right Toe - Web between 4th and 5th) 1. Cleansed with: Clean wound with Normal Saline 2. Anesthetic Topical Lidocaine 4% cream to wound bed prior to debridement 4. Dressing Applied: Other dressing (specify in notes) 5. Secondary Dressing Applied Dry Gauze Notes silvercell between 3rd and 4th toes and 4th and 5th toes on each foot bilateral. wrap with kerlix keeping gauze between all toes, secured with tape and tubigrip. Wound #3 (Left Toe - Web between 4th and 5th) 1. Cleansed with: Clean wound with Normal Saline 2. Anesthetic Topical Lidocaine 4% cream to wound bed prior to debridement 4. Dressing Applied: Other dressing (specify in notes) 5. Secondary Dressing Applied Dry Gauze Notes silvercell between 3rd and 4th toes and 4th and 5th toes on each foot bilateral. wrap with kerlix keeping gauze between all toes, secured with tape and tubigrip. WILBORN, MEMBRENO ([id number]) Electronic Signature(s) Signed: 10/16/2017 4:49:25 PM By: Baltazar Najjar MD Previous Signature: 10/16/2017 4:19:27 PM Version By: Renne Crigler Entered By: Baltazar Najjar on 10/16/2017 16:34:45 [place]ne[place], Khiem A. ([id number]) -------------------------------------------------------------------------------- Multi-Disciplinary Care Plan Details Patient Name: [name], Jahn A. Date of Service: 10/16/2017 3:15 PM Medical Record Number: [id number] Patient  Account Number: [id number] Date of Birth/Sex: 1970-11-07 (47 y.o. Male) Treating RN: Renne Crigler Primary Care Manuelito Poage: PATIENT, NO Other Clinician: Referring Marquise Wicke: Minna Antis Treating Aizza Santiago/Extender: Altamese [place] in Treatment: 6 Active Inactive ` Abuse / Safety / Falls / Self Care Management Nursing Diagnoses: Potential for falls Goals: Patient will not experience any injury related to falls Date Initiated: 09/04/2017 Target Resolution Date: 12/21/2017 Goal Status: Active Interventions: Assess impairment of mobility on admission and as needed per policy Assess personal safety and home safety (as indicated) on admission and as needed Assess self care needs on admission and as needed Provide education on basic hygiene Provide education on fall prevention Treatment Activities: Education provided on Basic Hygiene : 09/04/2017 Notes: ` Nutrition Nursing Diagnoses: Imbalanced nutrition Potential for alteratiion in Nutrition/Potential for imbalanced nutrition Goals: Patient/caregiver agrees to  and verbalizes understanding of need to use nutritional supplements and/or vitamins as prescribed Date Initiated: 09/04/2017 Target Resolution Date: 12/21/2017 Goal Status: Active Interventions: Assess patient nutrition upon admission and as needed per policy Notes: ` Orientation to the Wound Care Program [place], Lelynd A. ([id number]) Nursing Diagnoses: Knowledge deficit related to the wound healing center program Goals: Patient/caregiver will verbalize understanding of the Wound Healing Center Program Date Initiated: 09/04/2017 Target Resolution Date: 09/21/2017 Goal Status: Active Interventions: Provide education on orientation to the wound center Notes: ` Pain, Acute or Chronic Nursing Diagnoses: Pain, acute or chronic: actual or potential Potential alteration in comfort, pain Goals: Patient/caregiver will verbalize adequate pain control between  visits Date Initiated: 09/04/2017 Target Resolution Date: 11/23/2017 Goal Status: Active Interventions: Complete pain assessment as per visit requirements Notes: ` Soft Tissue Infection Nursing Diagnoses: Impaired tissue integrity Knowledge deficit related to disease process and management Knowledge deficit related to home infection control: handwashing, handling of soiled dressings, supply storage Potential for infection: soft tissue Goals: Patient/caregiver will verbalize understanding of or measures to prevent infection and contamination in the home setting Date Initiated: 09/04/2017 Target Resolution Date: 11/23/2017 Goal Status: Active Signs and symptoms of infection will be recognized early to allow for prompt treatment Date Initiated: 09/04/2017 Target Resolution Date: 12/21/2017 Goal Status: Active Interventions: Assess signs and symptoms of infection every visit Provide education on infection Treatment Activities: Education provided on Infection : 09/04/2017 Dobos, Arslan A. ([id number]) Notes: ` Wound/Skin Impairment Nursing Diagnoses: Impaired tissue integrity Knowledge deficit related to smoking impact on wound healing Knowledge deficit related to ulceration/compromised skin integrity Goals: Ulcer/skin breakdown will have a volume reduction of 80% by week 12 Date Initiated: 09/04/2017 Target Resolution Date: 12/21/2017 Goal Status: Active Interventions: Assess patient/caregiver ability to perform ulcer/skin care regimen upon admission and as needed Assess ulceration(s) every visit Provide education on smoking Provide education on ulcer and skin care Notes: Electronic Signature(s) Signed: 10/16/2017 4:19:27 PM By: Renne Crigler Entered By: Renne Crigler on 10/16/2017 15:18:10 Brenton, Ediel A. ([id number]) -------------------------------------------------------------------------------- Pain Assessment Details Patient Name: [name], Kiley A. Date of Service:  10/16/2017 3:15 PM Medical Record Number: [id number] Patient Account Number: [id number] Date of Birth/Sex: 02-23-1971 (47 y.o. Male) Treating RN: Renne Crigler Primary Care Darold Miley: PATIENT, NO Other Clinician: Referring Dorianne Perret: Minna Antis Treating Maurica Omura/Extender: Altamese [place] in Treatment: 6 Active Problems Location of Pain Severity and Description of Pain Patient Has Paino Yes Site Locations Pain Location: Pain in Ulcers With Dressing Change: Yes Duration of the Pain. Constant / Intermittento Intermittent Character of Pain Describe the Pain: Burning Pain Management and Medication Current Pain Management: Electronic Signature(s) Signed: 10/16/2017 4:19:27 PM By: Renne Crigler Entered By: Renne Crigler on 10/16/2017 15:09:07 Deeley, Dusan A. ([id number]) -------------------------------------------------------------------------------- Patient/Caregiver Education Details Patient Name: [name], Trenden A. Date of Service: 10/16/2017 3:15 PM Medical Record Number: [id number] Patient Account Number: [id number] Date of Birth/Gender: 06/10/1971 (47 y.o. Male) Treating RN: Renne Crigler Primary Care Physician: PATIENT, NO Other Clinician: Referring Physician: Minna Antis Treating Physician/Extender: Altamese  in Treatment: 6 Education Assessment Education Provided To: Patient Education Topics Provided Wound/Skin Impairment: Handouts: Caring for Your Ulcer Methods: Explain/Verbal Responses: State content correctly Electronic Signature(s) Signed: 10/16/2017 4:19:27 PM By: Renne Crigler Entered By: Renne Crigler on 10/16/2017 15:53:14 Delamater, Rice A. ([id number]) -------------------------------------------------------------------------------- Wound Assessment Details Patient Name: [name], Terre A. Date of Service: 10/16/2017 3:15 PM Medical Record Number: [id number] Patient Account Number: [id number] Date of Birth/Sex: [birth year] (47  y.o. Male) Treating RN: Renne Crigler Primary  Care Jameson Morrow: PATIENT, NO Other Clinician: Referring Zoii Florer: Minna Antis Treating Tyreesha Maharaj/Extender: Altamese [place] in Treatment: 6 Wound Status Wound Number: 1 Primary Etiology: Atypical Wound Location: Right Toe - Web between 4th and 5th Wound Status: Open Wounding Event: Gradually Appeared Date Acquired: 08/05/2017 Weeks Of Treatment: 6 Clustered Wound: No Pending Amputation On Presentation Photos Photo Uploaded By: Renne Crigler on 10/16/2017 16:20:43 Wound Measurements Length: (cm) 1.5 Width: (cm) 1 Depth: (cm) 0.1 Area: (cm) 1.178 Volume: (cm) 0.118 % Reduction in Area: 53.7% % Reduction in Volume: 53.5% Epithelialization: None Tunneling: No Undermining: No Wound Description Classification: Partial Thickness Wound Margin: Flat and Intact Exudate Amount: Small Exudate Type: Serosanguineous Exudate Color: red, brown Foul Odor After Cleansing: No Slough/Fibrino No Wound Bed Granulation Amount: Large (67-100%) Exposed Structure Granulation Quality: Red Fascia Exposed: No Necrotic Amount: Small (1-33%) Fat Layer (Subcutaneous Tissue) Exposed: No Necrotic Quality: Adherent Slough Tendon Exposed: No Muscle Exposed: No Joint Exposed: No Bone Exposed: No Periwound Skin Texture Berthelot, Deandre A. ([id number]) Texture Color No Abnormalities Noted: No No Abnormalities Noted: No Callus: No Atrophie Blanche: No Crepitus: No Cyanosis: No Excoriation: Yes Ecchymosis: No Induration: Yes Erythema: No Rash: No Hemosiderin Staining: No Scarring: No Mottled: No Pallor: No Moisture Rubor: No No Abnormalities Noted: No Dry / Scaly: No Maceration: No Wound Preparation Ulcer Cleansing: Rinsed/Irrigated with Saline Topical Anesthetic Applied: Other: lidocaine 4%, Treatment Notes Wound #1 (Right Toe - Web between 4th and 5th) 1. Cleansed with: Clean wound with Normal Saline 2.  Anesthetic Topical Lidocaine 4% cream to wound bed prior to debridement 4. Dressing Applied: Other dressing (specify in notes) 5. Secondary Dressing Applied Dry Gauze Notes silvercell between 3rd and 4th toes and 4th and 5th toes on each foot bilateral. wrap with kerlix keeping gauze between all toes, secured with tape and tubigrip. Electronic Signature(s) Signed: 10/16/2017 4:19:27 PM By: Renne Crigler Entered By: Renne Crigler on 10/16/2017 15:17:15 Antone, Medardo A. ([id number]) -------------------------------------------------------------------------------- Wound Assessment Details Patient Name: [name], Thomos A. Date of Service: 10/16/2017 3:15 PM Medical Record Number: [id number] Patient Account Number: [id number] Date of Birth/Sex: 1970/12/19 (47 y.o. Male) Treating RN: Renne Crigler Primary Care Rayne Loiseau: PATIENT, NO Other Clinician: Referring Parv Manthey: Minna Antis Treating Kourtnee Lahey/Extender: Maxwell Caul Weeks in Treatment: 6 Wound Status Wound Number: 3 Primary Etiology: Pressure Ulcer Wound Location: Left Toe - Web between 4th and 5th Wound Status: Open Wounding Event: Pressure Injury Date Acquired: 10/13/2017 Weeks Of Treatment: 0 Clustered Wound: No Photos Photo Uploaded By: Renne Crigler on 10/16/2017 16:20:44 Wound Measurements Length: (cm) 2.1 Width: (cm) 2 Depth: (cm) 0.1 Area: (cm) 3.299 Volume: (cm) 0.33 % Reduction in Area: % Reduction in Volume: Epithelialization: None Tunneling: No Undermining: No Wound Description Classification: Category/Stage II Wound Margin: Flat and Intact Exudate Amount: Medium Exudate Type: Serosanguineous Exudate Color: red, brown Foul Odor After Cleansing: No Slough/Fibrino Yes Wound Bed Granulation Amount: Large (67-100%) Exposed Structure Granulation Quality: Red Fascia Exposed: No Necrotic Amount: None Present (0%) Fat Layer (Subcutaneous Tissue) Exposed: Yes Tendon Exposed: No Muscle  Exposed: No Joint Exposed: No Bone Exposed: No Periwound Skin Texture Alberg, Deontez A. ([id number]) Texture Color No Abnormalities Noted: No No Abnormalities Noted: No Callus: No Atrophie Blanche: No Crepitus: No Cyanosis: No Excoriation: No Ecchymosis: No Induration: No Erythema: No Rash: No Hemosiderin Staining: No Scarring: No Mottled: No Pallor: No Moisture Rubor: No No Abnormalities Noted: No Dry / Scaly: No Maceration: No Wound Preparation Ulcer Cleansing: Rinsed/Irrigated with Saline Topical Anesthetic Applied: Other: lidocaine 4%,  Treatment Notes Wound #3 (Left Toe - Web between 4th and 5th) 1. Cleansed with: Clean wound with Normal Saline 2. Anesthetic Topical Lidocaine 4% cream to wound bed prior to debridement 4. Dressing Applied: Other dressing (specify in notes) 5. Secondary Dressing Applied Dry Gauze Notes silvercell between 3rd and 4th toes and 4th and 5th toes on each foot bilateral. wrap with kerlix keeping gauze between all toes, secured with tape and tubigrip. Electronic Signature(s) Signed: 10/16/2017 4:19:27 PM By: Renne Crigler Entered By: Renne Crigler on 10/16/2017 15:15:39 Waibel, Mauri A. ([id number]) -------------------------------------------------------------------------------- Vitals Details Patient Name: [name], Leroi A. Date of Service: 10/16/2017 3:15 PM Medical Record Number: [id number] Patient Account Number: [id number] Date of Birth/Sex: 1971/07/14 (47 y.o. Male) Treating RN: Renne Crigler Primary Care Alilah Mcmeans: PATIENT, NO Other Clinician: Referring Chyler Creely: Minna Antis Treating Yousof Alderman/Extender: Altamese [place] in Treatment: 6 Vital Signs Time Taken: 03:09 Temperature (F): 98.6 Height (in): 71 Pulse (bpm): 79 Weight (lbs): 180 Respiratory Rate (breaths/min): 16 Body Mass Index (BMI): 25.1 Blood Pressure (mmHg): 127/86 Reference Range: 80 - 120 mg / dl Electronic Signature(s) Signed: 10/16/2017  4:19:27 PM By: Renne Crigler Entered By: Renne Crigler on 10/16/2017 15:09:25

## 2017-10-23 ENCOUNTER — Encounter: Payer: No Typology Code available for payment source | Attending: Internal Medicine | Admitting: Internal Medicine

## 2017-10-23 DIAGNOSIS — E785 Hyperlipidemia, unspecified: Secondary | ICD-10-CM | POA: Diagnosis not present

## 2017-10-23 DIAGNOSIS — B353 Tinea pedis: Secondary | ICD-10-CM | POA: Insufficient documentation

## 2017-10-23 DIAGNOSIS — F17210 Nicotine dependence, cigarettes, uncomplicated: Secondary | ICD-10-CM | POA: Diagnosis not present

## 2017-10-23 DIAGNOSIS — L97521 Non-pressure chronic ulcer of other part of left foot limited to breakdown of skin: Secondary | ICD-10-CM | POA: Insufficient documentation

## 2017-10-25 NOTE — Progress Notes (Signed)
[place], [place] Duvid A. ([id number]) Visit Report for 10/23/2017 HPI Details Patient Name: [name], [name] Date of Service: 10/23/2017 3:15 PM Medical Record Number: [id number] Patient Account Number: [id number] Date of Birth/Sex: 05-21-1971 (47 y.o. Male) Treating RN: Ahmed Prima Primary Care Provider: PATIENT, NO Other Clinician: Referring Provider: Harvest Dark Treating Provider/Extender: Ricard Dillon Weeks in Treatment: 7 History of Present Illness HPI Description: 09/04/17; this is a 47 year old man who was not a diabetic and has no known history of peripheral vascular disease. He does have hyperlipidemia and a history of cocaine abuse although he insists that this is not active. He tells me that about a month ago he started having denuded skin between the toes of his left foot and on the plantar aspect of his left foot. He was picking at the skin and pulling it off. More recently he has had involvement of the toes on the right foot. Week or 2 ago he started getting swelling and pain in the left foot and he was seen in the ER on 08/29/17 he was diagnosed with cellulitis. The only ulcer that they commented on was the left fifth toe. An x-ray suggested ulcer at the fifth MTP slightly osteopenic appearance of the fifth met head question early osteo. He has 14 days of Bactrim DS prescribed and the patient states the swelling and pain in the left foot is improved since starting this 6 days ago. ABIs in this clinic were 1.28 on the right and 1.3 on the left. The patient is a smoker at one half pack per day 09/11/17; patient came last week and in general I think he had severe tinea pedis with secondary bacterial cellulitis. He was on Septra DS and is completed this. I gave him oral terbinafine for the tinea pedis. A culture I did on the left foot last week grew Pseudomonas which is quinolone sensitive. I'll give him 10 days of ciprofloxacin today. However he arrives with his foot looking a lot  better. There is less erythema and less warmth 09/18/17; patient has completed this systemic antifungal therapy and antibiotics for secondary bacterial cellulitis. He is using Lotrimin between his toes and silver alginate. All of this looks a lot better 10/02/17;everything is cleaned up in the patient's bilateral feet except for the right fourth fifth toe web space. The patient had severe tinea pedis with secondary cellulitis. Multiple denuded areas between his toes. We have treated him successfully for all of this however he still has 1 remaining area 10/16/17; patient arrives after a 2 week hiatus he is back at work. Using Lotrimin cream and silver alginate between his toes. He tells me that in the last 2 days he's had denuded areas of skin that he is pulled off. There is no evidence of secondary infection or cellulitis. Not really a lot of evidence of tinea pedis between the toes either. 10/23/17; the patient still has extensive skin breakdown between the bilateral fourth fifth, third fourth and even into the second third toe on the right is been using silver alginate. There is no evidence of secondary infection or cellulitis however this is a lot worse than a week ago Electronic Signature(s) Signed: 10/23/2017 4:49:28 PM By: Linton Ham MD Entered By: Linton Ham on 10/23/2017 16:38:54 [name], [name] ([id number]) -------------------------------------------------------------------------------- Physical Exam Details Patient Name: [name], [name] Date of Service: 10/23/2017 3:15 PM Medical Record Number: [id number] Patient Account Number: [id number] Date of Birth/Sex: 21-Nov-1970 (47 y.o. Male) Treating RN: Ahmed Prima Primary Care Provider: PATIENT, NO  Other Clinician: Referring Provider: Harvest Dark Treating Provider/Extender: Tito Dine in Treatment: 7 Notes Exam; he has denuded skin in both feet between the third and fourth fourth and fifth toes there are ulceration  in the web space especially the left fourth fifth. I think this is tinea pedis there is no evidence of secondary infection Electronic Signature(s) Signed: 10/23/2017 4:49:28 PM By: Linton Ham MD Entered By: Linton Ham on 10/23/2017 16:39:44 [name], [name] ([id number]) -------------------------------------------------------------------------------- Physician Orders Details Patient Name: [name], Graysin A. Date of Service: 10/23/2017 3:15 PM Medical Record Number: [id number] Patient Account Number: [id number] Date of Birth/Sex: 1971-08-08 (47 y.o. Male) Treating RN: Ahmed Prima Primary Care Provider: PATIENT, NO Other Clinician: Referring Provider: Harvest Dark Treating Provider/Extender: Tito Dine in Treatment: 7 Verbal / Phone Orders: Yes Clinician: Pinkerton, Debi Read Back and Verified: Yes Diagnosis Coding Wound Cleansing Wound #1 Right Toe - Web between 4th and 5th o Clean wound with Normal Saline. o Cleanse wound with mild soap and water o May Shower, gently pat wound dry prior to applying new dressing. Wound #3 Left Toe - Web between 4th and 5th o Clean wound with Normal Saline. o Cleanse wound with mild soap and water o May Shower, gently pat wound dry prior to applying new dressing. Wound #4 Left Toe - Web between 3rd and 4th o Clean wound with Normal Saline. o Cleanse wound with mild soap and water o May Shower, gently pat wound dry prior to applying new dressing. Wound #5 Right Toe - Web between 3rd and 4th o Clean wound with Normal Saline. o Cleanse wound with mild soap and water o May Shower, gently pat wound dry prior to applying new dressing. Anesthetic (add to Medication List) Wound #1 Right Toe - Web between 4th and 5th o Topical Lidocaine 4% cream applied to wound bed prior to debridement (In Clinic Only). Wound #3 Left Toe - Web between 4th and 5th o Topical Lidocaine 4% cream applied to wound bed prior to  debridement (In Clinic Only). Wound #4 Left Toe - Web between 3rd and 4th o Topical Lidocaine 4% cream applied to wound bed prior to debridement (In Clinic Only). Wound #5 Right Toe - Web between 3rd and 4th o Topical Lidocaine 4% cream applied to wound bed prior to debridement (In Clinic Only). Skin Barriers/Peri-Wound Care Wound #1 Right Toe - Web between 4th and 5th o Antifungal cream Wound #3 Left Toe - Web between 4th and 5th o Antifungal cream Wound #4 Left Toe - Web between 3rd and 4th o Antifungal cream Wound #5 Right Toe - Web between 3rd and 4th [name], [name] ([id number]) o Antifungal cream Primary Wound Dressing Wound #1 Right Toe - Web between 4th and 5th o Silvercel Non-Adherent - between all toes Wound #3 Left Toe - Web between 4th and 5th o Silvercel Non-Adherent - between all toes Wound #4 Left Toe - Web between 3rd and 4th o Silvercel Non-Adherent - between all toes Wound #5 Right Toe - Web between 3rd and 4th o Silvercel Non-Adherent - between all toes Secondary Dressing Wound #1 Right Toe - Web between 4th and 5th o ABD pad o Dry Gauze o Conform/Kerlix Wound #3 Left Toe - Web between 4th and 5th o ABD pad o Dry Gauze o Conform/Kerlix Wound #4 Left Toe - Web between 3rd and 4th o ABD pad o Dry Gauze o Conform/Kerlix Wound #5 Right Toe - Web between 3rd and 4th o ABD pad   o Dry Gauze o Conform/Kerlix Dressing Change Frequency Wound #1 Right Toe - Web between 4th and 5th o Change dressing every day. Wound #3 Left Toe - Web between 4th and 5th o Change dressing every day. Wound #4 Left Toe - Web between 3rd and 4th o Change dressing every day. Wound #5 Right Toe - Web between 3rd and 4th o Change dressing every day. Follow-up Appointments o Return Appointment in 2 weeks. Additional Orders / Instructions o Increase protein intake. Patient Medications [name], [name] ([id number]) Allergies:  NKDA Notifications Medication Indication Start End ketoconazole tinea pedis 10/23/2017 DOSE topical 2 % cream - cream topical to areas affected between toes lidocaine DOSE 1 - topical 4 % cream - 1 cream topical Electronic Signature(s) Signed: 10/23/2017 4:13:20 PM By: Alric Quan Signed: 10/23/2017 4:49:28 PM By: Linton Ham MD Previous Signature: 10/23/2017 3:40:58 PM Version By: Linton Ham MD Entered By: Alric Quan on 10/23/2017 16:13:18 [name], [name] ([id number]) -------------------------------------------------------------------------------- Prescription 10/23/2017 Patient Name: [name], [name] Provider: Ricard Dillon MD Date of Birth: 10/28/1970 NPI#: [id number] Sex: Jerilynn Mages DEA#: [id number] Phone #: [phone] License #: [id number] Patient Address: [facility] [place], [place] [address] [address] [place], [place] [address] [phone] Allergies NKDA Medication Medication: Route: Strength: Form: lidocaine topical 4% cream Class: TOPICAL LOCAL ANESTHETICS Dose: Frequency / Time: Indication: 1 1 cream topical Number of Refills: Number of Units: 0 Generic Substitution: Start Date: End Date: Administered at [facility] [place]: Yes Time Administered: Time Discontinued: Note to Pharmacy: Signature(s): Date(s): Electronic Signature(s) Signed: 10/23/2017 4:49:28 PM By: Linton Ham MD Signed: 10/24/2017 3:16:44 PM By: Alric Quan Entered By: Alric Quan on 10/23/2017 16:13:20 Kingsley, Jaelen A. ([id number]) Niess, Kamdyn A. ([id number]) --------------------------------------------------------------------------------  Problem List Details Patient Name: [name], [name] Date of Service: 10/23/2017 3:15 PM Medical Record Number: [id number] Patient Account Number: [id number] Date of Birth/Sex: 11-27-1970 (47 y.o. 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 ([id number]) -------------------------------------------------------------------------------- Progress Note Details Patient Name: [name], [name] Date of Service: 10/23/2017 3:15 PM Medical Record Number: [id number] Patient Account Number: [id number] Date of Birth/Sex: 1971-06-17 (47 y.o. Male) Treating RN: Ahmed Prima Primary Care Provider: PATIENT, NO Other Clinician: Referring Provider: Harvest Dark Treating Provider/Extender: Ricard Dillon Weeks in Treatment: 7 Subjective History of Present Illness (HPI) 09/04/17; this is a 47 year old man who was not a diabetic and has no known history of peripheral vascular disease. He does have hyperlipidemia and a history of cocaine abuse although he insists that this is not active. He tells me that about a month ago he started having denuded skin between the toes of his left foot and on the plantar aspect of his left foot. He was picking at the skin and pulling it off. More recently he has had involvement of the toes on the right foot. Week or 2 ago he started getting swelling and pain in the left foot and he was seen in the ER on 08/29/17 he was  diagnosed with cellulitis. The only ulcer that they commented on was the left fifth toe. An x-ray suggested ulcer at the fifth MTP slightly osteopenic appearance of the fifth met head question early  osteo. He has 14 days of Bactrim DS prescribed and the patient states the swelling and pain in the left foot is improved since starting this 6 days ago. ABIs in this clinic were 1.28 on the right and 1.3 on the left. The patient is a smoker at one half pack per day 09/11/17; patient came last week and in general I think he had severe tinea pedis with secondary bacterial cellulitis. He was on Septra DS and is completed this. I gave him oral terbinafine for the tinea pedis. A culture I did on the left foot last week grew Pseudomonas which is quinolone sensitive. I'll give him 10 days of ciprofloxacin today. However he arrives with his foot looking a lot better. There is less erythema and less warmth 09/18/17; patient has completed this systemic antifungal therapy and antibiotics for secondary bacterial cellulitis. He is using Lotrimin between his toes and silver alginate. All of this looks a lot better 10/02/17;everything is cleaned up in the patient's bilateral feet except for the right fourth fifth toe web space. The patient had severe tinea pedis with secondary cellulitis. Multiple denuded areas between his toes. We have treated him successfully for all of this however he still has 1 remaining area 10/16/17; patient arrives after a 2 week hiatus he is back at work. Using Lotrimin cream and silver alginate between his toes. He tells me that in the last 2 days he's had denuded areas of skin that he is pulled off. There is no evidence of secondary infection or cellulitis. Not really a lot of evidence of tinea pedis between the toes either. 10/23/17; the patient still has extensive skin breakdown between the bilateral fourth fifth, third fourth and even into the second third toe on the right is been using silver alginate. There is no evidence of secondary infection or cellulitis however this is a lot worse than a week ago Objective Constitutional Vitals Time Taken: 3:14 PM, Height: 71 in, Weight: 180 lbs,  BMI: 25.1, Temperature: 98.4 F, Pulse: 78 bpm, Respiratory Rate: 18 breaths/min, Blood Pressure: 108/69 mmHg. Integumentary (Hair, Skin) Wound #1 status is Open. Original cause of wound was Gradually Appeared. The wound is located on the Right Toe - Web between 4th and 5th. The wound measures 2cm length x 1.5cm width x 0.1cm depth; 2.356cm^2 area and 0.236cm^3 volume. There is no tunneling or undermining noted. There is a medium amount of serosanguineous drainage noted. The wound margin is flat and intact. There is small (1-33%) red granulation within the wound bed. There is a large (67-100%) Hoffmeister, Seung A. ([id number]) amount of necrotic tissue within the wound bed including Adherent Slough. The periwound skin appearance exhibited: Excoriation, Induration, Maceration. The periwound skin appearance did not exhibit: Callus, Crepitus, Rash, Scarring, Dry/Scaly, Atrophie Blanche, Cyanosis, Ecchymosis, Hemosiderin Staining, Mottled, Pallor, Rubor, Erythema. Wound #3 status is Open. Original cause of wound was Pressure Injury. The wound is located on the Left Toe - Web between 4th and 5th. The wound measures 2.1cm length x 3cm width x 0.1cm depth; 4.948cm^2 area and 0.495cm^3 volume. There is Fat Layer (Subcutaneous Tissue) Exposed exposed. There is no tunneling or undermining noted. There is a medium amount of serosanguineous drainage noted. The wound margin is flat and intact. There is small (1-33%) red granulation within the wound bed.  There is a large (67-100%) amount of necrotic tissue within the wound bed including Adherent Slough. The periwound skin appearance exhibited: Maceration. The periwound skin appearance did not exhibit: Callus, Crepitus, Excoriation, Induration, Rash, Scarring, Dry/Scaly, Atrophie Blanche, Cyanosis, Ecchymosis, Hemosiderin Staining, Mottled, Pallor, Rubor, Erythema. Wound #4 status is Open. Original cause of wound was Gradually Appeared. The wound is located on the  Left Toe - Web between 3rd and 4th. The wound measures 2cm length x 3cm width x 0.1cm depth; 4.712cm^2 area and 0.471cm^3 volume. There is no tunneling or undermining noted. There is a medium amount of serosanguineous drainage noted. The wound margin is distinct with the outline attached to the wound base. There is small (1-33%) red granulation within the wound bed. There is a large (67-100%) amount of necrotic tissue within the wound bed including Adherent Slough. The periwound skin appearance exhibited: Maceration. Wound #5 status is Open. Original cause of wound was Gradually Appeared. The wound is located on the Right Toe - Web between 3rd and 4th. The wound measures 1.5cm length x 1cm width x 0.1cm depth; 1.178cm^2 area and 0.118cm^3 volume. There is no tunneling or undermining noted. There is a medium amount of serosanguineous drainage noted. The wound margin is distinct with the outline attached to the wound base. There is small (1-33%) red granulation within the wound bed. There is a large (67-100%) amount of necrotic tissue within the wound bed including Adherent Slough. The periwound skin appearance exhibited: Maceration. Assessment Active Problems ICD-10 L97.522 - Non-pressure chronic ulcer of other part of left foot with fat layer exposed L03.116 - Cellulitis of left lower limb B35.3 - Tinea pedis Plan Wound Cleansing: Wound #1 Right Toe - Web between 4th and 5th: Clean wound with Normal Saline. Cleanse wound with mild soap and water May Shower, gently pat wound dry prior to applying new dressing. Wound #3 Left Toe - Web between 4th and 5th: Clean wound with Normal Saline. Cleanse wound with mild soap and water May Shower, gently pat wound dry prior to applying new dressing. Wound #4 Left Toe - Web between 3rd and 4th: Clean wound with Normal Saline. Cleanse wound with mild soap and water [name], [name] ([id number]) May Shower, gently pat wound dry prior to applying new  dressing. Wound #5 Right Toe - Web between 3rd and 4th: Clean wound with Normal Saline. Cleanse wound with mild soap and water May Shower, gently pat wound dry prior to applying new dressing. Anesthetic (add to Medication List): Wound #1 Right Toe - Web between 4th and 5th: Topical Lidocaine 4% cream applied to wound bed prior to debridement (In Clinic Only). Wound #3 Left Toe - Web between 4th and 5th: Topical Lidocaine 4% cream applied to wound bed prior to debridement (In Clinic Only). Wound #4 Left Toe - Web between 3rd and 4th: Topical Lidocaine 4% cream applied to wound bed prior to debridement (In Clinic Only). Wound #5 Right Toe - Web between 3rd and 4th: Topical Lidocaine 4% cream applied to wound bed prior to debridement (In Clinic Only). Skin Barriers/Peri-Wound Care: Wound #1 Right Toe - Web between 4th and 5th: Antifungal cream Wound #3 Left Toe - Web between 4th and 5th: Antifungal cream Wound #4 Left Toe - Web between 3rd and 4th: Antifungal cream Wound #5 Right Toe - Web between 3rd and 4th: Antifungal cream Primary Wound Dressing: Wound #1 Right Toe - Web between 4th and 5th: Silvercel Non-Adherent - between all toes Wound #3 Left Toe - Web  between 4th and 5th: Silvercel Non-Adherent - between all toes Wound #4 Left Toe - Web between 3rd and 4th: Silvercel Non-Adherent - between all toes Wound #5 Right Toe - Web between 3rd and 4th: Silvercel Non-Adherent - between all toes Secondary Dressing: Wound #1 Right Toe - Web between 4th and 5th: ABD pad Dry Gauze Conform/Kerlix Wound #3 Left Toe - Web between 4th and 5th: ABD pad Dry Gauze Conform/Kerlix Wound #4 Left Toe - Web between 3rd and 4th: ABD pad Dry Gauze Conform/Kerlix Wound #5 Right Toe - Web between 3rd and 4th: ABD pad Dry Gauze Conform/Kerlix Dressing Change Frequency: Wound #1 Right Toe - Web between 4th and 5th: Change dressing every day. Wound #3 Left Toe - Web between 4th and  5th: Change dressing every day. Wound #4 Left Toe - Web between 3rd and 4th: Change dressing every day. Wound #5 Right Toe - Web between 3rd and 4th: Change dressing every day. Follow-up Appointments: Return Appointment in 2 weeks. Schuelke, Davarion A. ([id number]) Additional Orders / Instructions: Increase protein intake. The following medication(s) was prescribed: ketoconazole topical 2 % cream cream topical to areas affected between toes for tinea pedis starting 10/23/2017 lidocaine topical 4 % cream 1 1 cream topical was prescribed at facility o #1 I have escribed Ketoconazole to be used daily probably for 30 days #2 at this point I have withheld oral antifungals #3 I've continued with silver alginate with gauze separating his toes #4 follow-up 2 weeks Electronic Signature(s) Signed: 10/23/2017 4:49:28 PM By: Linton Ham MD Entered By: Linton Ham on 10/23/2017 16:41:37 [place], Gordon. ([id number]) -------------------------------------------------------------------------------- SuperBill Details Patient Name: [name], [name] Date of Service: 10/23/2017 Medical Record Number: [id number] Patient Account Number: [id number] Date of Birth/Sex: 1971-01-07 (47 y.o. Male) Treating RN: Ahmed Prima Primary Care Provider: PATIENT, NO Other Clinician: Referring Provider: Harvest Dark Treating Provider/Extender: Ricard Dillon Weeks in Treatment: 7 Diagnosis Coding ICD-10 Codes Code Description [phone] Non-pressure chronic ulcer of other part of left foot with fat layer exposed L03.116 Cellulitis of left lower limb B35.3 Tinea pedis Facility Procedures CPT4 Code: [id number] Description: 99214 - WOUND CARE VISIT-LEV 4 EST PT Modifier: Quantity: 1 Physician Procedures CPT4 Code: [id number] Description: 01658 - WC PHYS LEVEL 2 - EST PT ICD-10 Diagnosis Description L97.522 Non-pressure chronic ulcer of other part of left foot with fa B35.3 Tinea pedis Modifier: t layer  exposed Quantity: 1 Electronic Signature(s) Signed: 10/23/2017 4:49:28 PM By: Linton Ham MD Previous Signature: 10/23/2017 4:14:05 PM Version By: Alric Quan Entered By: Linton Ham on 10/23/2017 16:42:09

## 2017-10-25 NOTE — Progress Notes (Signed)
[name] ([id number]) Visit Report for 10/23/2017 Arrival Information Details Patient Name: [name] Date of Service: 10/23/2017 3:15 PM Medical Record Number: [id number] Patient Account Number: [id number] Date of Birth/Sex: 11-22-1970 (46 y.o. Male) Treating RN: Phillis Haggis Primary Care Edie Darley: PATIENT, NO Other Clinician: Referring Lucky Alverson: Minna Antis Treating Galilea Quito/Extender: Altamese [place] in Treatment: 7 Visit Information History Since Last Visit All ordered tests and consults were completed: No Patient Arrived: Ambulatory Added or deleted any medications: No Arrival Time: 15:12 Any new allergies or adverse reactions: No Accompanied By: girlfriend Had [name] fall or experienced change in No Transfer Assistance: None activities of daily living that may affect Patient Identification Verified: Yes risk of falls: Secondary Verification Process Completed: Yes Signs or symptoms of abuse/neglect since last visito No Patient Requires Transmission-Based No Hospitalized since last visit: No Precautions: Has Dressing in Place as Prescribed: Yes Patient Has Alerts: No Pain Present Now: Yes Electronic Signature(s) Signed: 10/24/2017 3:16:44 PM By: Alejandro Mulling Entered By: Alejandro Mulling on 10/23/2017 15:14:15 [name] ([id number]) -------------------------------------------------------------------------------- Clinic Level of Care Assessment Details Patient Name: [name], [name] Date of Service: 10/23/2017 3:15 PM Medical Record Number: [id number] Patient Account Number: [id number] Date of Birth/Sex: 1971-01-25 (47 y.o. Male) Treating RN: Phillis Haggis Primary Care Herberth Deharo: PATIENT, NO Other Clinician: Referring Jarrod Bodkins: Minna Antis Treating Nichol Ator/Extender: Altamese [place] in Treatment: 7 Clinic Level of Care Assessment Items TOOL 4 Quantity Score X - Use when only an EandM is performed on FOLLOW-UP visit 1 0 ASSESSMENTS -  Nursing Assessment / Reassessment X - Reassessment of Co-morbidities (includes updates in patient status) 1 10 X- 1 5 Reassessment of Adherence to Treatment Plan ASSESSMENTS - Wound and Skin Assessment / Reassessment []  - Simple Wound Assessment / Reassessment - one wound 0 X- 4 5 Complex Wound Assessment / Reassessment - multiple wounds []  - 0 Dermatologic / Skin Assessment (not related to wound area) ASSESSMENTS - Focused Assessment []  - Circumferential Edema Measurements - multi extremities 0 []  - 0 Nutritional Assessment / Counseling / Intervention []  - 0 Lower Extremity Assessment (monofilament, tuning fork, pulses) []  - 0 Peripheral Arterial Disease Assessment (using hand held doppler) ASSESSMENTS - Ostomy and/or Continence Assessment and Care []  - Incontinence Assessment and Management 0 []  - 0 Ostomy Care Assessment and Management (repouching, etc.) PROCESS - Coordination of Care X - Simple Patient / Family Education for ongoing care 1 15 []  - 0 Complex (extensive) Patient / Family Education for ongoing care []  - 0 Staff obtains Chiropractor, Records, Test Results / Process Orders []  - 0 Staff telephones HHA, Nursing Homes / Clarify orders / etc []  - 0 Routine Transfer to another Facility (non-emergent condition) []  - 0 Routine Hospital Admission (non-emergent condition) []  - 0 New Admissions / Manufacturing engineer / Ordering NPWT, Apligraf, etc. []  - 0 Emergency Hospital Admission (emergent condition) X- 1 10 Simple Discharge Coordination [name] ([id number]) []  - 0 Complex (extensive) Discharge Coordination PROCESS - Special Needs []  - Pediatric / Minor Patient Management 0 []  - 0 Isolation Patient Management []  - 0 Hearing / Language / Visual special needs []  - 0 Assessment of Community assistance (transportation, D/C planning, etc.) []  - 0 Additional assistance / Altered mentation []  - 0 Support Surface(s) Assessment (bed, cushion, seat,  etc.) INTERVENTIONS - Wound Cleansing / Measurement []  - Simple Wound Cleansing - one wound 0 X- 4 5 Complex Wound Cleansing - multiple wounds X- 1 5 Wound Imaging (photographs - any  number of wounds) []  - 0 Wound Tracing (instead of photographs) []  - 0 Simple Wound Measurement - one wound X- 4 5 Complex Wound Measurement - multiple wounds INTERVENTIONS - Wound Dressings []  - Small Wound Dressing one or multiple wounds 0 X- 2 15 Medium Wound Dressing one or multiple wounds []  - 0 Large Wound Dressing one or multiple wounds X- 1 5 Application of Medications - topical []  - 0 Application of Medications - injection INTERVENTIONS - Miscellaneous []  - External ear exam 0 []  - 0 Specimen Collection (cultures, biopsies, blood, body fluids, etc.) []  - 0 Specimen(s) / Culture(s) sent or taken to Lab for analysis []  - 0 Patient Transfer (multiple staff / Nurse, adult / Similar devices) []  - 0 Simple Staple / Suture removal (25 or less) []  - 0 Complex Staple / Suture removal (26 or more) []  - 0 Hypo / Hyperglycemic Management (close monitor of Blood Glucose) []  - 0 Ankle / Brachial Index (ABI) - do not check if billed separately X- 1 5 Vital Signs [name] ([id number]) Has the patient been seen at the hospital within the last three years: Yes Total Score: 145 Level Of Care: New/Established - Level 4 Electronic Signature(s) Signed: 10/24/2017 3:16:44 PM By: Alejandro Mulling Entered By: Alejandro Mulling on 10/23/2017 16:14:00 [name] ([id number]) -------------------------------------------------------------------------------- Encounter Discharge Information Details Patient Name: [name], [name] Date of Service: 10/23/2017 3:15 PM Medical Record Number: [id number] Patient Account Number: [id number] Date of Birth/Sex: [birth year] (47 y.o. Male) Treating RN: Phillis Haggis Primary Care Kajal Scalici: PATIENT, NO Other Clinician: Referring Zavannah Deblois: Minna Antis Treating  [name]/Extender: Altamese [place] in Treatment: 7 Encounter Discharge Information Items Discharge Pain Level: 0 Discharge Condition: Stable Ambulatory Status: Ambulatory Discharge Destination: Home Private Transportation: Auto Accompanied By: girlfriend Schedule Follow-up Appointment: Yes Medication Reconciliation completed and provided No to Patient/Care Yifan Auker: Clinical Summary of Care: Electronic Signature(s) Signed: 10/24/2017 3:16:44 PM By: Alejandro Mulling Entered By: Alejandro Mulling on 10/23/2017 15:54:49 Dost, Yuji [name]. ([id number]) -------------------------------------------------------------------------------- Lower Extremity Assessment Details Patient Name: [name], [name] Date of Service: 10/23/2017 3:15 PM Medical Record Number: [id number] Patient Account Number: [id number] Date of Birth/Sex: September 18, 1970 (47 y.o. Male) Treating RN: Phillis Haggis Primary Care Lashana Spang: PATIENT, NO Other Clinician: Referring Smera Guyette: Minna Antis Treating Vonnie Spagnolo/Extender: Altamese  in Treatment: 7 Vascular Assessment Pulses: Dorsalis Pedis Palpable: [Left:Yes] [Right:Yes] Posterior Tibial Extremity colors, hair growth, and conditions: Extremity Color: [Left:Normal] [Right:Normal] Temperature of Extremity: [Left:Warm] [Right:Warm] Capillary Refill: [Left:< 3 seconds] [Right:< 3 seconds] Toe Nail Assessment Left: Right: Thick: No No Discolored: No No Deformed: No No Improper Length and Hygiene: No No Electronic Signature(s) Signed: 10/24/2017 3:16:44 PM By: Alejandro Mulling Entered By: Alejandro Mulling on 10/23/2017 15:29:02 Hage, Danis [name]. ([id number]) -------------------------------------------------------------------------------- Multi Wound Chart Details Patient Name: [name], [name] Date of Service: 10/23/2017 3:15 PM Medical Record Number: [id number] Patient Account Number: [id number] Date of Birth/Sex: 06-Aug-1971 (48 y.o. Male) Treating RN:  Phillis Haggis Primary Care Julis Haubner: PATIENT, NO Other Clinician: Referring Min Tunnell: Minna Antis Treating Kameshia Madruga/Extender: Maxwell Caul Weeks in Treatment: 7 Vital Signs Height(in): 71 Pulse(bpm): 78 Weight(lbs): 180 Blood Pressure(mmHg): 108/69 Body Mass Index(BMI): 25 Temperature(F): 98.4 Respiratory Rate 18 (breaths/min): Photos: Wound Location: Right Toe - Web between 4th Left Toe - Web between 4th Left Hand - Web Between 3rd and 5th and 5th and 4th Digit Wounding Event: Gradually Appeared Pressure Injury Gradually Appeared Primary Etiology: Atypical Pressure Ulcer Atypical Date Acquired: 08/05/2017 10/13/2017 10/16/2017 Weeks of Treatment: 7 1  0 Wound Status: Open Open Open Pending Amputation on Yes No No Presentation: Measurements L x W x D 2x1.5x0.1 2.1x3x0.1 2x3x0.1 (cm) Area (cm) : 2.356 4.948 4.712 Volume (cm) : 0.236 0.495 0.471 % Reduction in Area: 7.40% -50.00% N/[name] % Reduction in Volume: 7.10% -50.00% N/[name] Classification: Partial Thickness Category/Stage II Partial Thickness Exudate Amount: Medium Medium Medium Exudate Type: Serosanguineous Serosanguineous Serosanguineous Exudate Color: red, brown red, brown red, brown Wound Margin: Flat and Intact Flat and Intact Distinct, outline attached Granulation Amount: Small (1-33%) Small (1-33%) Small (1-33%) Granulation Quality: Red Red Red Necrotic Amount: Large (67-100%) Large (67-100%) Large (67-100%) Exposed Structures: Fascia: No Fat Layer (Subcutaneous Fascia: No Fat Layer (Subcutaneous Tissue) Exposed: Yes Fat Layer (Subcutaneous Tissue) Exposed: No Fascia: No Tissue) Exposed: No Tendon: No Tendon: No Tendon: No Muscle: No Muscle: No Muscle: No Shill, Kanen [name]. ([id number]) Joint: No Joint: No Joint: No Bone: No Bone: No Bone: No Epithelialization: None None None Periwound Skin Texture: Excoriation: Yes Excoriation: No No Abnormalities Noted Induration: Yes Induration:  No Callus: No Callus: No Crepitus: No Crepitus: No Rash: No Rash: No Scarring: No Scarring: No Periwound Skin Moisture: Maceration: Yes Maceration: Yes Maceration: Yes Dry/Scaly: No Dry/Scaly: No Periwound Skin Color: Atrophie Blanche: No Atrophie Blanche: No No Abnormalities Noted Cyanosis: No Cyanosis: No Ecchymosis: No Ecchymosis: No Erythema: No Erythema: No Hemosiderin Staining: No Hemosiderin Staining: No Mottled: No Mottled: No Pallor: No Pallor: No Rubor: No Rubor: No Tenderness on Palpation: No No No Wound Preparation: Ulcer Cleansing: Ulcer Cleansing: Ulcer Cleansing: Rinsed/Irrigated with Saline Rinsed/Irrigated with Saline Rinsed/Irrigated with Saline Topical Anesthetic Applied: Topical Anesthetic Applied: Topical Anesthetic Applied: Other: lidocaine 4% Other: lidocaine 4% Other: lidocaine 4% Wound Number: 5 N/[name] N/[name] Photos: N/[name] N/[name] Wound Location: Right Hand - Web Between N/[name] N/[name] 3rd and 4th Digit Wounding Event: Gradually Appeared N/[name] N/[name] Primary Etiology: Fungal N/[name] N/[name] Date Acquired: 10/16/2017 N/[name] N/[name] Weeks of Treatment: 0 N/[name] N/[name] Wound Status: Open N/[name] N/[name] Pending Amputation on No N/[name] N/[name] Presentation: Measurements L x W x D 1.5x1x0.1 N/[name] N/[name] (cm) Area (cm) : 1.178 N/[name] N/[name] Volume (cm) : 0.118 N/[name] N/[name] % Reduction in Area: N/[name] N/[name] N/[name] % Reduction in Volume: N/[name] N/[name] N/[name] Classification: Partial Thickness N/[name] N/[name] Exudate Amount: Medium N/[name] N/[name] Exudate Type: Serosanguineous N/[name] N/[name] Exudate Color: red, brown N/[name] N/[name] Wound Margin: Distinct, outline attached N/[name] N/[name] Granulation Amount: Small (1-33%) N/[name] N/[name] Granulation Quality: Red N/[name] N/[name] Theard, Shamond [name]. ([id number]) Necrotic Amount: Large (67-100%) N/[name] N/[name] Exposed Structures: Fascia: No N/[name] N/[name] Fat Layer (Subcutaneous Tissue) Exposed: No Tendon: No Muscle: No Joint: No Bone: No Epithelialization: None N/[name] N/[name] Periwound Skin Texture: No Abnormalities Noted N/[name] N/[name] Periwound Skin  Moisture: Maceration: Yes N/[name] N/[name] Periwound Skin Color: No Abnormalities Noted N/[name] N/[name] Tenderness on Palpation: No N/[name] N/[name] Wound Preparation: Ulcer Cleansing: N/[name] N/[name] Rinsed/Irrigated with Saline Topical Anesthetic Applied: Other: lidocaine 4% Treatment Notes Wound #1 (Right Toe - Web between 4th and 5th) 1. Cleansed with: Clean wound with Normal Saline 2. Anesthetic Topical Lidocaine 4% cream to wound bed prior to debridement 3. Peri-wound Care: Antifungal cream 4. Dressing Applied: Other dressing (specify in notes) 5. Secondary Dressing Applied ABD Pad Dry Gauze Kerlix/Conform 7. Secured with Tape Notes silvercel Wound #3 (Left Toe - Web between 4th and 5th) 1. Cleansed with: Clean wound with Normal Saline 2. Anesthetic Topical Lidocaine 4% cream to wound bed prior to debridement 3. Peri-wound Care: Antifungal cream 4. Dressing Applied: Other dressing (  specify in notes) 5. Secondary Dressing Applied ABD Pad Dry Gauze Kerlix/Conform 7. Secured with Tape Notes Goldammer, Wilferd [name]. ([id number]) silvercel Wound #4 (Left Toe - Web between 3rd and 4th) 1. Cleansed with: Clean wound with Normal Saline 2. Anesthetic Topical Lidocaine 4% cream to wound bed prior to debridement 3. Peri-wound Care: Antifungal cream 4. Dressing Applied: Other dressing (specify in notes) 5. Secondary Dressing Applied ABD Pad Dry Gauze Kerlix/Conform 7. Secured with Tape Notes silvercel Wound #5 (Right Toe - Web between 3rd and 4th) 1. Cleansed with: Clean wound with Normal Saline 2. Anesthetic Topical Lidocaine 4% cream to wound bed prior to debridement 3. Peri-wound Care: Antifungal cream 4. Dressing Applied: Other dressing (specify in notes) 5. Secondary Dressing Applied ABD Pad Dry Gauze Kerlix/Conform 7. Secured with Tape Notes silvercel Electronic Signature(s) Signed: 10/23/2017 4:49:28 PM By: Baltazar Najjar MD Entered By: Baltazar Najjar on 10/23/2017  16:37:25 Balzarini, Shloima [name]. ([id number]) -------------------------------------------------------------------------------- Multi-Disciplinary Care Plan Details Patient Name: [name], [name] Date of Service: 10/23/2017 3:15 PM Medical Record Number: [id number] Patient Account Number: [id number] Date of Birth/Sex: [birth year] (47 y.o. Male) Treating RN: Ashok Cordia, Debi Primary Care Quinnlyn Hearns: PATIENT, NO Other Clinician: Referring Heran Campau: Minna Antis Treating Areej Tayler/Extender: Altamese [place] in Treatment: 7 Active Inactive ` Abuse / Safety / Falls / Self Care Management Nursing Diagnoses: Potential for falls Goals: Patient will not experience any injury related to falls Date Initiated: 09/04/2017 Target Resolution Date: 12/21/2017 Goal Status: Active Interventions: Assess impairment of mobility on admission and as needed per policy Assess personal safety and home safety (as indicated) on admission and as needed Assess self care needs on admission and as needed Provide education on basic hygiene Provide education on fall prevention Treatment Activities: Education provided on Basic Hygiene : 09/04/2017 Notes: ` Nutrition Nursing Diagnoses: Imbalanced nutrition Potential for alteratiion in Nutrition/Potential for imbalanced nutrition Goals: Patient/caregiver agrees to and verbalizes understanding of need to use nutritional supplements and/or vitamins as prescribed Date Initiated: 09/04/2017 Target Resolution Date: 12/21/2017 Goal Status: Active Interventions: Assess patient nutrition upon admission and as needed per policy Notes: ` Orientation to the Wound Care Program [place], [name] ([id number]) Nursing Diagnoses: Knowledge deficit related to the wound healing center program Goals: Patient/caregiver will verbalize understanding of the Wound Healing Center Program Date Initiated: 09/04/2017 Target Resolution Date: 09/21/2017 Goal Status: Active Interventions: Provide  education on orientation to the wound center Notes: ` Pain, Acute or Chronic Nursing Diagnoses: Pain, acute or chronic: actual or potential Potential alteration in comfort, pain Goals: Patient/caregiver will verbalize adequate pain control between visits Date Initiated: 09/04/2017 Target Resolution Date: 11/23/2017 Goal Status: Active Interventions: Complete pain assessment as per visit requirements Notes: ` Soft Tissue Infection Nursing Diagnoses: Impaired tissue integrity Knowledge deficit related to disease process and management Knowledge deficit related to home infection control: handwashing, handling of soiled dressings, supply storage Potential for infection: soft tissue Goals: Patient/caregiver will verbalize understanding of or measures to prevent infection and contamination in the home setting Date Initiated: 09/04/2017 Target Resolution Date: 11/23/2017 Goal Status: Active Signs and symptoms of infection will be recognized early to allow for prompt treatment Date Initiated: 09/04/2017 Target Resolution Date: 12/21/2017 Goal Status: Active Interventions: Assess signs and symptoms of infection every visit Provide education on infection Treatment Activities: Education provided on Infection : 09/04/2017 [place], [name] ([id number]) Notes: ` Wound/Skin Impairment Nursing Diagnoses: Impaired tissue integrity Knowledge deficit related to smoking impact on wound healing Knowledge deficit related to ulceration/compromised skin integrity Goals: Ulcer/skin breakdown  will have [name] volume reduction of 80% by week 12 Date Initiated: 09/04/2017 Target Resolution Date: 12/21/2017 Goal Status: Active Interventions: Assess patient/caregiver ability to perform ulcer/skin care regimen upon admission and as needed Assess ulceration(s) every visit Provide education on smoking Provide education on ulcer and skin care Notes: Electronic Signature(s) Signed: 10/24/2017 3:16:44 PM By:  Alejandro Mulling Entered By: Alejandro Mulling on 10/23/2017 15:33:53 Routon, [name] ([id number]) -------------------------------------------------------------------------------- Pain Assessment Details Patient Name: [name], [name] Date of Service: 10/23/2017 3:15 PM Medical Record Number: [id number] Patient Account Number: [id number] Date of Birth/Sex: [birth year] (47 y.o. Male) Treating RN: Phillis Haggis Primary Care Diannia Hogenson: PATIENT, NO Other Clinician: Referring Jakorey Mcconathy: Minna Antis Treating Daoud Lobue/Extender: Maxwell Caul Weeks in Treatment: 7 Active Problems Location of Pain Severity and Description of Pain Patient Has Paino Yes Site Locations Pain Location: Pain in Ulcers Rate the pain. Current Pain Level: 4 Character of Pain Describe the Pain: Burning Pain Management and Medication Current Pain Management: Notes Topical or injectable lidocaine is offered to patient for acute pain when surgical debridement is performed. If needed, Patient is instructed to use over the counter pain medication for the following 24-48 hours after debridement. Wound care MDs do not prescribed pain medications. Patient has chronic pain or uncontrolled pain. Patient has been instructed to make an appointment with their Primary Care Physician for pain management. Electronic Signature(s) Signed: 10/24/2017 3:16:44 PM By: Alejandro Mulling Entered By: Alejandro Mulling on 10/23/2017 15:14:39 Goelz, Andyn [name]. ([id number]) -------------------------------------------------------------------------------- Patient/Caregiver Education Details Patient Name: [name], [name] Date of Service: 10/23/2017 3:15 PM Medical Record Number: [id number] Patient Account Number: [id number] Date of Birth/Gender: 22-May-1971 (47 y.o. Male) Treating RN: Phillis Haggis Primary Care Physician: PATIENT, NO Other Clinician: Referring Physician: Minna Antis Treating Physician/Extender: Altamese [place] in  Treatment: 7 Education Assessment Education Provided To: Patient Education Topics Provided Wound/Skin Impairment: Handouts: Caring for Your Ulcer, Other: change dressing as ordered Methods: Demonstration, Explain/Verbal Responses: State content correctly Electronic Signature(s) Signed: 10/24/2017 3:16:44 PM By: Alejandro Mulling Entered By: Alejandro Mulling on 10/23/2017 15:55:03 Nishida, Sathvik [name]. ([id number]) -------------------------------------------------------------------------------- Wound Assessment Details Patient Name: [name], [name] Date of Service: 10/23/2017 3:15 PM Medical Record Number: [id number] Patient Account Number: [id number] Date of Birth/Sex: 03-21-1971 (47 y.o. Male) Treating RN: Phillis Haggis Primary Care Chizara Mena: PATIENT, NO Other Clinician: Referring Zamirah Denny: Minna Antis Treating Jalana Moore/Extender: Maxwell Caul Weeks in Treatment: 7 Wound Status Wound Number: 1 Primary Etiology: Atypical Wound Location: Right Toe - Web between 4th and 5th Wound Status: Open Wounding Event: Gradually Appeared Date Acquired: 08/05/2017 Weeks Of Treatment: 7 Clustered Wound: No Pending Amputation On Presentation Photos Photo Uploaded By: Alejandro Mulling on 10/23/2017 16:04:33 Wound Measurements Length: (cm) 2 Width: (cm) 1.5 Depth: (cm) 0.1 Area: (cm) 2.356 Volume: (cm) 0.236 % Reduction in Area: 7.4% % Reduction in Volume: 7.1% Epithelialization: None Tunneling: No Undermining: No Wound Description Classification: Partial Thickness Wound Margin: Flat and Intact Exudate Amount: Medium Exudate Type: Serosanguineous Exudate Color: red, brown Foul Odor After Cleansing: No Slough/Fibrino No Wound Bed Granulation Amount: Small (1-33%) Exposed Structure Granulation Quality: Red Fascia Exposed: No Necrotic Amount: Large (67-100%) Fat Layer (Subcutaneous Tissue) Exposed: No Necrotic Quality: Adherent Slough Tendon Exposed: No Muscle Exposed:  No Joint Exposed: No Bone Exposed: No Periwound Skin Texture Gasparini, Bransyn [name]. ([id number]) Texture Color No Abnormalities Noted: No No Abnormalities Noted: No Callus: No Atrophie Blanche: No Crepitus: No Cyanosis: No Excoriation: Yes Ecchymosis: No Induration: Yes Erythema: No Rash: No Hemosiderin Staining: No Scarring:  No Mottled: No Pallor: No Moisture Rubor: No No Abnormalities Noted: No Dry / Scaly: No Maceration: Yes Wound Preparation Ulcer Cleansing: Rinsed/Irrigated with Saline Topical Anesthetic Applied: Other: lidocaine 4%, Treatment Notes Wound #1 (Right Toe - Web between 4th and 5th) 1. Cleansed with: Clean wound with Normal Saline 2. Anesthetic Topical Lidocaine 4% cream to wound bed prior to debridement 3. Peri-wound Care: Antifungal cream 4. Dressing Applied: Other dressing (specify in notes) 5. Secondary Dressing Applied ABD Pad Dry Gauze Kerlix/Conform 7. Secured with Tape Notes silvercel Electronic Signature(s) Signed: 10/24/2017 3:16:44 PM By: Alejandro Mulling Entered By: Alejandro Mulling on 10/23/2017 15:25:04 Janosik, [name] ([id number]) -------------------------------------------------------------------------------- Wound Assessment Details Patient Name: [name], [name] Date of Service: 10/23/2017 3:15 PM Medical Record Number: [id number] Patient Account Number: [id number] Date of Birth/Sex: 06/19/1971 (47 y.o. Male) Treating RN: Phillis Haggis Primary Care Batsheva Stevick: PATIENT, NO Other Clinician: Referring Xayvier Vallez: Minna Antis Treating Janalynn Eder/Extender: Maxwell Caul Weeks in Treatment: 7 Wound Status Wound Number: 3 Primary Etiology: Pressure Ulcer Wound Location: Left Toe - Web between 4th and 5th Wound Status: Open Wounding Event: Pressure Injury Date Acquired: 10/13/2017 Weeks Of Treatment: 1 Clustered Wound: No Photos Photo Uploaded By: Alejandro Mulling on 10/23/2017 16:04:59 Wound Measurements Length: (cm)  2.1 Width: (cm) 3 Depth: (cm) 0.1 Area: (cm) 4.948 Volume: (cm) 0.495 % Reduction in Area: -50% % Reduction in Volume: -50% Epithelialization: None Tunneling: No Undermining: No Wound Description Classification: Category/Stage II Wound Margin: Flat and Intact Exudate Amount: Medium Exudate Type: Serosanguineous Exudate Color: red, brown Foul Odor After Cleansing: No Slough/Fibrino Yes Wound Bed Granulation Amount: Small (1-33%) Exposed Structure Granulation Quality: Red Fascia Exposed: No Necrotic Amount: Large (67-100%) Fat Layer (Subcutaneous Tissue) Exposed: Yes Necrotic Quality: Adherent Slough Tendon Exposed: No Muscle Exposed: No Joint Exposed: No Bone Exposed: No Periwound Skin Texture Dikes, Mabry [name]. ([id number]) Texture Color No Abnormalities Noted: No No Abnormalities Noted: No Callus: No Atrophie Blanche: No Crepitus: No Cyanosis: No Excoriation: No Ecchymosis: No Induration: No Erythema: No Rash: No Hemosiderin Staining: No Scarring: No Mottled: No Pallor: No Moisture Rubor: No No Abnormalities Noted: No Dry / Scaly: No Maceration: Yes Wound Preparation Ulcer Cleansing: Rinsed/Irrigated with Saline Topical Anesthetic Applied: Other: lidocaine 4%, Treatment Notes Wound #3 (Left Toe - Web between 4th and 5th) 1. Cleansed with: Clean wound with Normal Saline 2. Anesthetic Topical Lidocaine 4% cream to wound bed prior to debridement 3. Peri-wound Care: Antifungal cream 4. Dressing Applied: Other dressing (specify in notes) 5. Secondary Dressing Applied ABD Pad Dry Gauze Kerlix/Conform 7. Secured with Tape Notes silvercel Electronic Signature(s) Signed: 10/24/2017 3:16:44 PM By: Alejandro Mulling Entered By: Alejandro Mulling on 10/23/2017 15:25:32 Stanke, Saxton [name]. ([id number]) -------------------------------------------------------------------------------- Wound Assessment Details Patient Name: [name], [name] Date of Service:  10/23/2017 3:15 PM Medical Record Number: [id number] Patient Account Number: [id number] Date of Birth/Sex: [birth year] (47 y.o. Male) Treating RN: Phillis Haggis Primary Care Khian Remo: PATIENT, NO Other Clinician: Referring Maryclare Nydam: Minna Antis Treating Surie Suchocki/Extender: Maxwell Caul Weeks in Treatment: 7 Wound Status Wound Number: 4 Primary Etiology: Atypical Wound Location: Left Hand - Web Between 3rd and 4th Wound Status: Open Digit Wounding Event: Gradually Appeared Date Acquired: 10/16/2017 Weeks Of Treatment: 0 Clustered Wound: No Photos Photo Uploaded By: Alejandro Mulling on 10/23/2017 16:05:48 Wound Measurements Length: (cm) 2 Width: (cm) 3 Depth: (cm) 0.1 Area: (cm) 4.712 Volume: (cm) 0.471 % Reduction in Area: % Reduction in Volume: Epithelialization: None Tunneling: No Undermining: No Wound Description Classification: Partial Thickness Wound Margin:  Distinct, outline attached Exudate Amount: Medium Exudate Type: Serosanguineous Exudate Color: red, brown Foul Odor After Cleansing: No Slough/Fibrino Yes Wound Bed Granulation Amount: Small (1-33%) Exposed Structure Granulation Quality: Red Fascia Exposed: No Necrotic Amount: Large (67-100%) Fat Layer (Subcutaneous Tissue) Exposed: No Necrotic Quality: Adherent Slough Tendon Exposed: No Muscle Exposed: No Joint Exposed: No Bone Exposed: No Periwound Skin Texture Roarty, [name] ([id number]) Texture Color No Abnormalities Noted: No No Abnormalities Noted: No Moisture No Abnormalities Noted: No Maceration: Yes Wound Preparation Ulcer Cleansing: Rinsed/Irrigated with Saline Topical Anesthetic Applied: Other: lidocaine 4%, Electronic Signature(s) Signed: 10/24/2017 3:16:44 PM By: Alejandro Mulling Entered By: Alejandro Mulling on 10/23/2017 15:27:18 Sluder, [name] ([id number]) -------------------------------------------------------------------------------- Wound Assessment Details Patient  Name: [name], [name] Date of Service: 10/23/2017 3:15 PM Medical Record Number: [id number] Patient Account Number: [id number] Date of Birth/Sex: 05-May-1971 (47 y.o. Male) Treating RN: Phillis Haggis Primary Care Anasophia Pecor: PATIENT, NO Other Clinician: Referring Tajah Schreiner: Minna Antis Treating Jaxxen Voong/Extender: Maxwell Caul Weeks in Treatment: 7 Wound Status Wound Number: 5 Primary Etiology: Fungal Wound Location: Right Hand - Web Between 3rd and 4th Wound Status: Open Digit Wounding Event: Gradually Appeared Date Acquired: 10/16/2017 Weeks Of Treatment: 0 Clustered Wound: No Photos Photo Uploaded By: Alejandro Mulling on 10/23/2017 16:06:21 Wound Measurements Length: (cm) 1.5 Width: (cm) 1 Depth: (cm) 0.1 Area: (cm) 1.178 Volume: (cm) 0.118 % Reduction in Area: % Reduction in Volume: Epithelialization: None Tunneling: No Undermining: No Wound Description Classification: Partial Thickness Wound Margin: Distinct, outline attached Exudate Amount: Medium Exudate Type: Serosanguineous Exudate Color: red, brown Foul Odor After Cleansing: No Slough/Fibrino Yes Wound Bed Granulation Amount: Small (1-33%) Exposed Structure Granulation Quality: Red Fascia Exposed: No Necrotic Amount: Large (67-100%) Fat Layer (Subcutaneous Tissue) Exposed: No Necrotic Quality: Adherent Slough Tendon Exposed: No Muscle Exposed: No Joint Exposed: No Bone Exposed: No Periwound Skin Texture Mcelmurry, [name] ([id number]) Texture Color No Abnormalities Noted: No No Abnormalities Noted: No Moisture No Abnormalities Noted: No Maceration: Yes Wound Preparation Ulcer Cleansing: Rinsed/Irrigated with Saline Topical Anesthetic Applied: Other: lidocaine 4%, Electronic Signature(s) Signed: 10/24/2017 3:16:44 PM By: Alejandro Mulling Entered By: Alejandro Mulling on 10/23/2017 15:28:23 Riffel, [name]  ([id number]) -------------------------------------------------------------------------------- Vitals Details Patient Name: [name], [name] Date of Service: 10/23/2017 3:15 PM Medical Record Number: [id number] Patient Account Number: [id number] Date of Birth/Sex: [birth year] (47 y.o. Male) Treating RN: Phillis Haggis Primary Care Aivan Fillingim: PATIENT, NO Other Clinician: Referring Raymie Trani: Minna Antis Treating Rashawd Laskaris/Extender: Maxwell Caul Weeks in Treatment: 7 Vital Signs Time Taken: 15:14 Temperature (F): 98.4 Height (in): 71 Pulse (bpm): 78 Weight (lbs): 180 Respiratory Rate (breaths/min): 18 Body Mass Index (BMI): 25.1 Blood Pressure (mmHg): 108/69 Reference Range: 80 - 120 mg / dl Electronic Signature(s) Signed: 10/24/2017 3:16:44 PM By: Alejandro Mulling Entered By: Alejandro Mulling on 10/23/2017 15:15:20

## 2017-11-06 ENCOUNTER — Other Ambulatory Visit: Payer: Self-pay | Admitting: Internal Medicine

## 2017-11-06 ENCOUNTER — Ambulatory Visit
Admission: RE | Admit: 2017-11-06 | Discharge: 2017-11-06 | Disposition: A | Discharge status: Home / Self Care | Payer: No Typology Code available for payment source | Source: Ambulatory Visit | Attending: Internal Medicine | Admitting: Internal Medicine

## 2017-11-06 ENCOUNTER — Encounter: Payer: No Typology Code available for payment source | Admitting: Internal Medicine

## 2017-11-06 DIAGNOSIS — S91302A Unspecified open wound, left foot, initial encounter: Secondary | ICD-10-CM | POA: Insufficient documentation

## 2017-11-06 DIAGNOSIS — L97529 Non-pressure chronic ulcer of other part of left foot with unspecified severity: Secondary | ICD-10-CM | POA: Insufficient documentation

## 2017-11-06 DIAGNOSIS — B999 Unspecified infectious disease: Secondary | ICD-10-CM

## 2017-11-06 DIAGNOSIS — L97521 Non-pressure chronic ulcer of other part of left foot limited to breakdown of skin: Secondary | ICD-10-CM | POA: Diagnosis not present

## 2017-11-07 ENCOUNTER — Other Ambulatory Visit
Admission: RE | Admit: 2017-11-07 | Discharge: 2017-11-07 | Disposition: A | Discharge status: Home / Self Care | Payer: No Typology Code available for payment source | Source: Ambulatory Visit | Attending: Internal Medicine | Admitting: Internal Medicine

## 2017-11-07 DIAGNOSIS — L089 Local infection of the skin and subcutaneous tissue, unspecified: Secondary | ICD-10-CM | POA: Insufficient documentation

## 2017-11-07 NOTE — Progress Notes (Addendum)
JAMARE, VANATTA ([id number]) Visit Report for 11/06/2017 HPI Details Patient Name: [name], [name] Date of Service: 11/06/2017 3:45 PM Medical Record Number: [id number] Patient Account Number: [id number] Date of Birth/Sex: 1971-03-19 (47 y.o. Male) Treating RN: Cornell Barman Primary Care Provider: PATIENT, NO Other Clinician: Referring Provider: Harvest Dark Treating Provider/Extender: Tito Dine in Treatment: 9 History of Present Illness HPI Description: 09/04/17; this is a 47 year old man who was not a diabetic and has no known history of peripheral vascular disease. He does have hyperlipidemia and a history of cocaine abuse although he insists that this is not active. He tells me that about a month ago he started having denuded skin between the toes of his left foot and on the plantar aspect of his left foot. He was picking at the skin and pulling it off. More recently he has had involvement of the toes on the right foot. Week or 2 ago he started getting swelling and pain in the left foot and he was seen in the ER on 08/29/17 he was diagnosed with cellulitis. The only ulcer that they commented on was the left fifth toe. An x-ray suggested ulcer at the fifth MTP slightly osteopenic appearance of the fifth met head question early osteo. He has 14 days of Bactrim DS prescribed and the patient states the swelling and pain in the left foot is improved since starting this 6 days ago. ABIs in this clinic were 1.28 on the right and 1.3 on the left. The patient is a smoker at one half pack per day 09/11/17; patient came last week and in general I think he had severe tinea pedis with secondary bacterial cellulitis. He was on Septra DS and is completed this. I gave him oral terbinafine for the tinea pedis. A culture I did on the left foot last week grew Pseudomonas which is quinolone sensitive. I'll give him 10 days of ciprofloxacin today. However he arrives with his foot looking a lot  better. There is less erythema and less warmth 09/18/17; patient has completed this systemic antifungal therapy and antibiotics for secondary bacterial cellulitis. He is using Lotrimin between his toes and silver alginate. All of this looks a lot better 10/02/17;everything is cleaned up in the patient's bilateral feet except for the right fourth fifth toe web space. The patient had severe tinea pedis with secondary cellulitis. Multiple denuded areas between his toes. We have treated him successfully for all of this however he still has 1 remaining area 10/16/17; patient arrives after a 2 week hiatus he is back at work. Using Lotrimin cream and silver alginate between his toes. He tells me that in the last 2 days he's had denuded areas of skin that he is pulled off. There is no evidence of secondary infection or cellulitis. Not really a lot of evidence of tinea pedis between the toes either. 10/23/17; the patient still has extensive skin breakdown between the bilateral fourth fifth, third fourth and even into the second third toe on the right is been using silver alginate. There is no evidence of secondary infection or cellulitis however this is a lot worse than a week ago 11/06/17; the patient arrives today with his left greater than right foot in not a very good condition. He has denuded skin from basically the base of the second to the fifth toe on the left with skin loss on all his toe webspaces in that distribution. More concerning once again he has erythema in the forefoot which I think  is secondary cellulitis there is an odor and a greenish discoloration probably pseudomonas again. oOn the right foot he has loss of skin in the webspace between the fifth and fourth and the fourth and third toes. Although they state there is an odor here I didn't recognize it. oHe also brings up than when he first went to the ER just before he came here they did an x-ray that suggested the possibility of early  osteomyelitis in the fifth toe on the left. He will need x-rays of this foot again. I will x-ray the right foot as well. He is not a diabetic and does not have PVD oHe is complaining of pain on the lateral part of his foot at the heel but he thinks this might be from walking on the side of his foot to avoid the discomfort of the left forefoot. He has a history of recurrent tinea pedis as teenager when he was in sports. The areas initially responded to oral antibiotics and oral antifungals. Culture of this I did showed pseudomonas last time and the greenish discoloration certainly makes that a possibility. I gave him oral ciprofloxacin for 10 days 500 twice a day and terbinafine 250 a day for 2 weeks. oHe has been using topical ketoconazole for quite a period of time now and silver alginate which also should have antifungal properties. Electronic Signature(s) REPINSKI, Holden A. ([id number]) Signed: 11/06/2017 5:05:40 PM By: Linton Ham MD Entered By: Linton Ham on 11/06/2017 16:56:15 Vollman, Crawford A. ([id number]) -------------------------------------------------------------------------------- Physical Exam Details Patient Name: [name], Brit A. Date of Service: 11/06/2017 3:45 PM Medical Record Number: [id number] Patient Account Number: [id number] Date of Birth/Sex: 1971-03-16 (47 y.o. Male) Treating RN: Cornell Barman Primary Care Provider: PATIENT, NO Other Clinician: Referring Provider: Harvest Dark Treating Provider/Extender: Ricard Dillon Weeks in Treatment: 9 Constitutional Sitting or standing Blood Pressure is within target range for patient.. Pulse regular and within target range for patient.Marland Kitchen Respirations regular, non-labored and within target range.. Temperature is normal and within the target range for the patient.Marland Kitchen appears in no distress. Eyes Conjunctivae clear. No discharge. Cardiovascular Pedal pulses palpable and strong bilaterally.. Lymphatic Nonpalpable the  popliteal area. Musculoskeletal Is no involvement of the metatarsophalangeal joints on the left foot as far as I can tell. Integumentary (Hair, Skin) Erythema in the forefoot and left foot and some tenderness. Notes Wound exam; more extensive deterioration in the left foot today. Wide open area on the base of his toes from #2 through #5 and loss of skin between the toes in these entire web areas. I think there is also erythema and tenderness compatible with cellulitis in the forefoot all of this again makes me think of the same issues that I doubt with when he first came in with improvement that is of tinea pedis with secondary cellulitis oOn the right foot he has denuded skin in the webspaces of 5 and 4 and then 4 and 3. This doesn't look as bad as the left foot. Electronic Signature(s) Signed: 11/06/2017 5:05:40 PM By: Linton Ham MD Entered By: Linton Ham on 11/06/2017 16:58:42 Hernon, Lamb A. ([id number]) -------------------------------------------------------------------------------- Physician Orders Details Patient Name: [name], Fawaz A. Date of Service: 11/06/2017 3:45 PM Medical Record Number: [id number] Patient Account Number: [id number] Date of Birth/Sex: [birth year] (47 y.o. Male) Treating RN: Cornell Barman Primary Care Provider: PATIENT, NO Other Clinician: Referring Provider: Harvest Dark Treating Provider/Extender: Tito Dine in Treatment: 9 Verbal / Phone Orders: No Diagnosis Coding Patient Medications Allergies: NKDA Notifications Medication  Indication Start End Cipro Cellulitis left foot 11/06/2017 DOSE oral 500 mg tablet - 1 tablet oral bid for 10 days terbinafine HCl tinea pedis 11/06/2017 DOSE oral 250 mg tablet - 1 tablet oral daily for 14 days Electronic Signature(s) Signed: 11/06/2017 4:33:36 PM By: Linton Ham MD Previous Signature: 11/06/2017 4:29:40 PM Version By: Linton Ham MD Entered By: Linton Ham on 11/06/2017  16:33:35 Kysar, Chayne A. ([id number]) -------------------------------------------------------------------------------- Problem List Details Patient Name: [name], Janet A. Date of Service: 11/06/2017 3:45 PM Medical Record Number: [id number] Patient Account Number: [id number] Date of Birth/Sex: March 22, 1971 (47 y.o. 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 ([id number]) -------------------------------------------------------------------------------- Progress Note Details Patient Name: [name], Jacinto A. Date of Service: 11/06/2017 3:45 PM Medical Record Number: [id number] Patient Account Number: [id number] Date of Birth/Sex: 30-Mar-1971 (47 y.o. Male) Treating RN: Cornell Barman Primary Care Provider: PATIENT, NO Other Clinician: Referring Provider: Harvest Dark Treating Provider/Extender: Ricard Dillon Weeks in Treatment: 9 Subjective History of Present Illness (HPI) 09/04/17; this is a 47 year old man who was not a diabetic and has no known history of peripheral vascular disease. He does have hyperlipidemia and a history of cocaine abuse although he insists that this is not active. He tells me that about a month ago he started having denuded skin between the toes of his left foot and on the plantar aspect of his left foot. He was picking at the skin and pulling it off. More recently he has had involvement of the  toes on the right foot. Week or 2 ago he started getting swelling and pain in the left foot and he was seen in the ER on 08/29/17 he was diagnosed with cellulitis. The only ulcer that they commented on was the left fifth toe. An x-ray suggested ulcer at the fifth MTP slightly osteopenic appearance of the fifth met head question early osteo. He has 14 days of Bactrim DS prescribed and the patient states the swelling and pain in the left foot is improved since starting this 6 days ago. ABIs in this clinic were 1.28 on the right and 1.3 on the left. The patient is a smoker at one half pack per day 09/11/17; patient came last week and in general I think he had severe tinea pedis with secondary bacterial cellulitis. He was on Septra DS and is completed this. I gave him oral terbinafine for the tinea pedis. A culture I did on the left foot last week grew Pseudomonas which is quinolone sensitive. I'll give him 10 days of ciprofloxacin today. However he arrives with his foot looking a lot better. There is less erythema and less warmth 09/18/17; patient has completed this systemic antifungal therapy and antibiotics for secondary bacterial cellulitis. He is using Lotrimin between his toes and silver alginate. All of this looks a lot better 10/02/17;everything is cleaned up in the patient's bilateral feet except for the right fourth fifth toe web space. The patient had severe tinea pedis with secondary cellulitis. Multiple denuded areas between his toes. We have treated him successfully for all  of this however he still has 1 remaining area 10/16/17; patient arrives after a 2 week hiatus he is back at work. Using Lotrimin cream and silver alginate between his toes. He tells me that in the last 2 days he's had denuded areas of skin that he is pulled off. There is no evidence of secondary infection or cellulitis. Not really a lot of evidence of tinea pedis between the toes either. 10/23/17; the patient still has  extensive skin breakdown between the bilateral fourth fifth, third fourth and even into the second third toe on the right is been using silver alginate. There is no evidence of secondary infection or cellulitis however this is a lot worse than a week ago 11/06/17; the patient arrives today with his left greater than right foot in not a very good condition. He has denuded skin from basically the base of the second to the fifth toe on the left with skin loss on all his toe webspaces in that distribution. More concerning once again he has erythema in the forefoot which I think is secondary cellulitis there is an odor and a greenish discoloration probably pseudomonas again. On the right foot he has loss of skin in the webspace between the fifth and fourth and the fourth and third toes. Although they state there is an odor here I didn't recognize it. He also brings up than when he first went to the ER just before he came here they did an x-ray that suggested the possibility of early osteomyelitis in the fifth toe on the left. He will need x-rays of this foot again. I will x-ray the right foot as well. He is not a diabetic and does not have PVD He is complaining of pain on the lateral part of his foot at the heel but he thinks this might be from walking on the side of his foot to avoid the discomfort of the left forefoot. He has a history of recurrent tinea pedis as teenager when he was in sports. The areas initially responded to oral antibiotics and oral antifungals. Culture of this I did showed pseudomonas last time and the greenish discoloration certainly makes that a possibility. I gave him oral ciprofloxacin for 10 days 500 twice a day and terbinafine 250 a day for 2 weeks. He has been using topical ketoconazole for quite a period of time now and silver alginate which also should have antifungal properties. Kary, Cordero A. ([id number]) Objective Constitutional Sitting or standing Blood Pressure is  within target range for patient.. Pulse regular and within target range for patient.Marland Kitchen Respirations regular, non-labored and within target range.. Temperature is normal and within the target range for the patient.Marland Kitchen appears in no distress. Vitals Time Taken: 3:41 PM, Height: 71 in, Weight: 180 lbs, BMI: 25.1, Temperature: 97.9 F, Pulse: 65 bpm, Respiratory Rate: 18 breaths/min, Blood Pressure: 124/83 mmHg. Eyes Conjunctivae clear. No discharge. Cardiovascular Pedal pulses palpable and strong bilaterally.. Lymphatic Nonpalpable the popliteal area. Musculoskeletal Is no involvement of the metatarsophalangeal joints on the left foot as far as I can tell. General Notes: Wound exam; more extensive deterioration in the left foot today. Wide open area on the base of his toes from #2 through #5 and loss of skin between the toes in these entire web areas. I think there is also erythema and tenderness compatible with cellulitis in the forefoot all of this again makes me think of the same issues that I doubt with when he first came in with improvement that is  of tinea pedis with secondary cellulitis On the right foot he has denuded skin in the webspaces of 5 and 4 and then 4 and 3. This doesn't look as bad as the left foot. Integumentary (Hair, Skin) Erythema in the forefoot and left foot and some tenderness. Wound #1 status is Open. Original cause of wound was Gradually Appeared. The wound is located on the Right Toe - Web between 4th and 5th. The wound measures 1.5cm length x 3cm width x 0.1cm depth; 3.534cm^2 area and 0.353cm^3 volume. There is no tunneling or undermining noted. There is a large amount of serous drainage noted. The wound margin is flat and intact. There is large (67-100%) red granulation within the wound bed. There is a small (1-33%) amount of necrotic tissue within the wound bed including Adherent Slough. The periwound skin appearance exhibited: Excoriation, Induration, Maceration.  The periwound skin appearance did not exhibit: Callus, Crepitus, Rash, Scarring, Dry/Scaly, Atrophie Blanche, Cyanosis, Ecchymosis, Hemosiderin Staining, Mottled, Pallor, Rubor, Erythema. Wound #3 status is Open. Original cause of wound was Pressure Injury. The wound is located on the Left Toe - Web between 4th and 5th. The wound measures 3cm length x 3cm width x 0.1cm depth; 7.069cm^2 area and 0.707cm^3 volume. There is Fat Layer (Subcutaneous Tissue) Exposed exposed. There is no tunneling or undermining noted. There is a large amount of serosanguineous drainage noted. Foul odor after cleansing was noted. The wound margin is flat and intact. There is small (1-33%) red granulation within the wound bed. There is a small (1-33%) amount of necrotic tissue within the wound bed including Adherent Slough. The periwound skin appearance exhibited: Maceration. The periwound skin appearance did not exhibit: Callus, Crepitus, Excoriation, Induration, Rash, Scarring, Dry/Scaly, Atrophie Blanche, Cyanosis, Ecchymosis, Hemosiderin Staining, Mottled, Pallor, Rubor, Erythema. Wound #4 status is Open. Original cause of wound was Gradually Appeared. The wound is located on the Left Toe - Web between 3rd and 4th. The wound measures 3cm length x 3cm width x 0.1cm depth; 7.069cm^2 area and 0.707cm^3 volume. There is no tunneling or undermining noted. There is a large amount of serosanguineous drainage noted. Foul odor after cleansing was noted. The wound margin is distinct with the outline attached to the wound base. There is large (67-100%) red granulation within the wound bed. There is a small (1-33%) amount of necrotic tissue within the wound bed including Adherent Slough. The periwound skin appearance exhibited: Maceration. [place], [place] ([id number]) Wound #5 status is Open. Original cause of wound was Gradually Appeared. The wound is located on the Right Toe - Web between 3rd and 4th. The wound measures 2cm length  x 3cm width x 0.1cm depth; 4.712cm^2 area and 0.471cm^3 volume. There is no tunneling noted. There is a large amount of serosanguineous drainage noted. Foul odor after cleansing was noted. The wound margin is distinct with the outline attached to the wound base. There is large (67-100%) red granulation within the wound bed. There is a small (1-33%) amount of necrotic tissue within the wound bed including Adherent Slough. The periwound skin appearance exhibited: Maceration. Wound #6 status is Open. Original cause of wound was Gradually Appeared. The wound is located on the Left Toe - Web between 2nd and 3rd. The wound measures 2.5cm length x 2.5cm width x 0.1cm depth; 4.909cm^2 area and 0.491cm^3 volume. There is no tunneling or undermining noted. There is a large amount of serosanguineous drainage noted. Foul odor after cleansing was noted. The wound margin is distinct with the outline attached to  the wound base. There is large (67- 100%) red granulation within the wound bed. There is a small (1-33%) amount of necrotic tissue within the wound bed including Adherent Slough. The periwound skin appearance exhibited: Maceration. Periwound temperature was noted as No Abnormality. The periwound has tenderness on palpation. Wound #7 status is Open. Original cause of wound was Gradually Appeared. The wound is located on the [place]. The wound measures 1.2cm length x 6cm width x 0.1cm depth; 5.655cm^2 area and 0.565cm^3 volume. There is no tunneling or undermining noted. There is a large amount of serosanguineous drainage noted. Foul odor after cleansing was noted. The wound margin is distinct with the outline attached to the wound base. There is large (67-100%) red granulation within the wound bed. There is a small (1-33%) amount of necrotic tissue within the wound bed including Adherent Slough. The periwound skin appearance exhibited: Maceration. Periwound temperature was noted as No Abnormality.  The periwound has tenderness on palpation. Wound #8 status is Open. Original cause of wound was Gradually Appeared. The wound is located on the Left,Dorsal Foot. The wound measures 0.5cm length x 0.5cm width x 0.1cm depth; 0.196cm^2 area and 0.02cm^3 volume. There is no tunneling or undermining noted. There is a large amount of serosanguineous drainage noted. Foul odor after cleansing was noted. The wound margin is distinct with the outline attached to the wound base. There is large (67-100%) red granulation within the wound bed. There is no necrotic tissue within the wound bed. The periwound skin appearance exhibited: Maceration. Periwound temperature was noted as No Abnormality. The periwound has tenderness on palpation. Wound #9 status is Open. Original cause of wound was Gradually Appeared. The wound is located on the Left Toe - Web between 1st and 2nd. The wound measures 1cm length x 1.5cm width x 0.1cm depth; 1.178cm^2 area and 0.118cm^3 volume. There is no tunneling or undermining noted. There is a large amount of serosanguineous drainage noted. Foul odor after cleansing was noted. The wound margin is distinct with the outline attached to the wound base. There is large (67- 100%) red granulation within the wound bed. There is a small (1-33%) amount of necrotic tissue within the wound bed including Adherent Slough. The periwound skin appearance exhibited: Maceration. Periwound temperature was noted as No Abnormality. The periwound has tenderness on palpation. Assessment Active Problems ICD-10 L97.522 - Non-pressure chronic ulcer of other part of left foot with fat layer exposed L03.116 - Cellulitis of left lower limb B35.3 - Tinea pedis Plan Delagarza, Skip A. ([id number]) The following medication(s) was prescribed: Cipro oral 500 mg tablet 1 tablet oral bid for 10 days for Cellulitis left foot starting 11/06/2017 terbinafine HCl oral 250 mg tablet 1 tablet oral daily for 14 days for tinea  pedis starting 11/06/2017 o #1 continue with silver alginate between the toes which is drying and the silver is antifungal #2 keep the areas clean and dry #3 oral ciprofloxacin 500 twice a day for 10 days and terbinafine 250 mg a day for 2 weeks with one renewal #4 by opinion about this hasn't really changed any G has tinea pedis which is severe and secondary cellulitis. The green discoloration the skin in the base of left foot makes me thinks this is pseudomonas again #5 I cautioned him that if the erythema spreads he may need to seek urgent medical attention #6 I gave him a note for medical necessity at work. He works and still work boots his toes are held tightly together. I think  this may be part of the problem perhaps most of the problem 7 a signifcant deterioration which is concerning 8 He reminds me about his original xray that raised the possibility of early osteo in the left 5ht toe. His abrupt resonse to treatment made me think that this was unlikely nowever now will re xray both feet Electronic Signature(s) Signed: 11/06/2017 5:05:40 PM By: Linton Ham MD Entered By: Linton Ham on 11/06/2017 17:02:41 [place], Gurshan A. ([id number]) -------------------------------------------------------------------------------- SuperBill Details Patient Name: [name], Lamere A. Date of Service: 11/06/2017 Medical Record Number: [id number] Patient Account Number: [id number] Date of Birth/Sex: 1970/12/19 (47 y.o. Male) Treating RN: Cornell Barman Primary Care Provider: PATIENT, NO Other Clinician: Referring Provider: Harvest Dark Treating Provider/Extender: Tito Dine in Treatment: 9 Diagnosis Coding ICD-10 Codes Code Description [phone] Non-pressure chronic ulcer of other part of left foot with fat layer exposed L03.116 Cellulitis of left lower limb B35.3 Tinea pedis Facility Procedures CPT4 Code: [id number] Description: 38101 - WOUND CARE VISIT-LEV 5 EST PT Modifier: Quantity:  1 Physician Procedures CPT4 Code: [id number] Description: 52778 - WC PHYS LEVEL 4 - EST PT ICD-10 Diagnosis Description L97.522 Non-pressure chronic ulcer of other part of left foot with fa L03.116 Cellulitis of left lower limb Modifier: t layer exposed Quantity: 1 Electronic Signature(s) Signed: 11/06/2017 5:05:40 PM By: Linton Ham MD Entered By: Linton Ham on 11/06/2017 17:03:08

## 2017-11-09 NOTE — Progress Notes (Signed)
VAIL, VUNCANNON ([id number]) Visit Report for 11/06/2017 Arrival Information Details Patient Name: [name], [name] Date of Service: 11/06/2017 3:45 PM Medical Record Number: [id number] Patient Account Number: [id number] Date of Birth/Sex: 08-Jun-1971 (47 y.o. Male) Treating RN: Phillis Haggis Primary Care Harvy Riera: PATIENT, NO Other Clinician: Referring Margaretta Chittum: Minna Antis Treating Serenah Mill/Extender: Altamese [place] in Treatment: 9 Visit Information History Since Last Visit All ordered tests and consults were completed: No Patient Arrived: Ambulatory Added or deleted any medications: No Arrival Time: 15:40 Any new allergies or adverse reactions: No Accompanied By: girlfriend Had a fall or experienced change in No Transfer Assistance: None activities of daily living that may affect Patient Identification Verified: Yes risk of falls: Secondary Verification Process Completed: Yes Signs or symptoms of abuse/neglect since last visito No Patient Requires Transmission-Based No Hospitalized since last visit: No Precautions: Has Dressing in Place as Prescribed: Yes Patient Has Alerts: No Pain Present Now: Yes Electronic Signature(s) Signed: 11/08/2017 8:00:35 AM By: Alejandro Mulling Entered By: Alejandro Mulling on 11/06/2017 15:40:34 Pierrelouis, Oryon A. ([id number]) -------------------------------------------------------------------------------- Clinic Level of Care Assessment Details Patient Name: [name], [name] Date of Service: 11/06/2017 3:45 PM Medical Record Number: [id number] Patient Account Number: [id number] Date of Birth/Sex: 1971/07/18 (47 y.o. Male) Treating RN: Huel Coventry Primary Care Cinsere Mizrahi: PATIENT, NO Other Clinician: Referring Razi Hickle: Minna Antis Treating Ryett Hamman/Extender: Altamese [place] in Treatment: 9 Clinic Level of Care Assessment Items TOOL 4 Quantity Score []  - Use when only an EandM is performed on FOLLOW-UP visit 0 ASSESSMENTS -  Nursing Assessment / Reassessment []  - Reassessment of Co-morbidities (includes updates in patient status) 0 X- 1 5 Reassessment of Adherence to Treatment Plan ASSESSMENTS - Wound and Skin Assessment / Reassessment []  - Simple Wound Assessment / Reassessment - one wound 0 X- 6 5 Complex Wound Assessment / Reassessment - multiple wounds []  - 0 Dermatologic / Skin Assessment (not related to wound area) ASSESSMENTS - Focused Assessment []  - Circumferential Edema Measurements - multi extremities 0 []  - 0 Nutritional Assessment / Counseling / Intervention []  - 0 Lower Extremity Assessment (monofilament, tuning fork, pulses) []  - 0 Peripheral Arterial Disease Assessment (using hand held doppler) ASSESSMENTS - Ostomy and/or Continence Assessment and Care []  - Incontinence Assessment and Management 0 []  - 0 Ostomy Care Assessment and Management (repouching, etc.) PROCESS - Coordination of Care []  - Simple Patient / Family Education for ongoing care 0 X- 1 20 Complex (extensive) Patient / Family Education for ongoing care []  - 0 Staff obtains Chiropractor, Records, Test Results / Process Orders []  - 0 Staff telephones HHA, Nursing Homes / Clarify orders / etc []  - 0 Routine Transfer to another Facility (non-emergent condition) []  - 0 Routine Hospital Admission (non-emergent condition) []  - 0 New Admissions / Manufacturing engineer / Ordering NPWT, Apligraf, etc. []  - 0 Emergency Hospital Admission (emergent condition) []  - 0 Simple Discharge Coordination Ellingsen, Akoni A. ([id number]) X- 1 15 Complex (extensive) Discharge Coordination PROCESS - Special Needs []  - Pediatric / Minor Patient Management 0 []  - 0 Isolation Patient Management []  - 0 Hearing / Language / Visual special needs []  - 0 Assessment of Community assistance (transportation, D/C planning, etc.) []  - 0 Additional assistance / Altered mentation []  - 0 Support Surface(s) Assessment (bed, cushion, seat,  etc.) INTERVENTIONS - Wound Cleansing / Measurement []  - Simple Wound Cleansing - one wound 0 X- 6 5 Complex Wound Cleansing - multiple wounds X- 1 5 Wound Imaging (photographs - any number of wounds) []  -  0 Wound Tracing (instead of photographs) []  - 0 Simple Wound Measurement - one wound X- 6 5 Complex Wound Measurement - multiple wounds INTERVENTIONS - Wound Dressings []  - Small Wound Dressing one or multiple wounds 0 []  - 0 Medium Wound Dressing one or multiple wounds X- 2 20 Large Wound Dressing one or multiple wounds []  - 0 Application of Medications - topical []  - 0 Application of Medications - injection INTERVENTIONS - Miscellaneous []  - External ear exam 0 []  - 0 Specimen Collection (cultures, biopsies, blood, body fluids, etc.) []  - 0 Specimen(s) / Culture(s) sent or taken to Lab for analysis []  - 0 Patient Transfer (multiple staff / Nurse, adult / Similar devices) []  - 0 Simple Staple / Suture removal (25 or less) []  - 0 Complex Staple / Suture removal (26 or more) []  - 0 Hypo / Hyperglycemic Management (close monitor of Blood Glucose) []  - 0 Ankle / Brachial Index (ABI) - do not check if billed separately X- 1 5 Vital Signs Koran, [name] ([id number]) Has the patient been seen at the hospital within the last three years: Yes Total Score: 180 Level Of Care: New/Established - Level 5 Electronic Signature(s) Signed: 11/06/2017 5:24:42 PM By: Elliot Gurney, BSN, RN, CWS, Kim RN, BSN Entered By: Elliot Gurney, BSN, RN, CWS, Kim on 11/06/2017 16:49:39 Laraia, [name] ([id number]) -------------------------------------------------------------------------------- Encounter Discharge Information Details Patient Name: [name], [name] Date of Service: 11/06/2017 3:45 PM Medical Record Number: [id number] Patient Account Number: [id number] Date of Birth/Sex: Feb 18, 1971 (47 y.o. Male) Treating RN: Huel Coventry Primary Care Evanna Washinton: PATIENT, NO Other Clinician: Referring Shanena Pellegrino:  Minna Antis Treating Jaena Brocato/Extender: Altamese [place] in Treatment: 9 Encounter Discharge Information Items Discharge Pain Level: 0 Discharge Condition: Stable Ambulatory Status: Ambulatory Discharge Destination: Home Transportation: Private Auto Accompanied By: self Schedule Follow-up Appointment: Yes Medication Reconciliation completed and No provided to Patient/Care Vani Gunner: Provided on Clinical Summary of Care: 11/06/2017 Form Type Recipient Paper Patient DC Electronic Signature(s) Signed: 11/06/2017 4:48:44 PM By: Curtis Sites Entered By: Curtis Sites on 11/06/2017 16:48:43 Stanzione, Terius A. ([id number]) -------------------------------------------------------------------------------- Lower Extremity Assessment Details Patient Name: [name], Daray A. Date of Service: 11/06/2017 3:45 PM Medical Record Number: [id number] Patient Account Number: [id number] Date of Birth/Sex: [birth year] (47 y.o. Male) Treating RN: Phillis Haggis Primary Care Ameet Sandy: PATIENT, NO Other Clinician: Referring Abigayl Hor: Minna Antis Treating Kortez Murtagh/Extender: Altamese [place] in Treatment: 9 Vascular Assessment Pulses: Dorsalis Pedis Palpable: [Left:Yes] [Right:Yes] Posterior Tibial Extremity colors, hair growth, and conditions: Extremity Color: [Left:Normal] [Right:Normal] Capillary Refill: [Left:< 3 seconds] [Right:< 3 seconds] Toe Nail Assessment Left: Right: Thick: No No Discolored: No No Deformed: No No Improper Length and Hygiene: No No Electronic Signature(s) Signed: 11/08/2017 8:00:35 AM By: Alejandro Mulling Entered By: Alejandro Mulling on 11/06/2017 16:11:01 Botto, Candice A. ([id number]) -------------------------------------------------------------------------------- Multi Wound Chart Details Patient Name: [name], Vinicio A. Date of Service: 11/06/2017 3:45 PM Medical Record Number: [id number] Patient Account Number: [id number] Date of Birth/Sex: [birth year]  (47 y.o. Male) Treating RN: Phillis Haggis Primary Care Arlesia Kiel: PATIENT, NO Other Clinician: Referring Jess Sulak: Minna Antis Treating Kaleea Penner/Extender: Maxwell Caul We[place] in Treatment: 9 Vital Signs Height(in): 71 Pulse(bpm): 65 Weight(lbs): 180 Blood Pressure(mmHg): 124/83 Body Mass Index(BMI): 25 Temperature(F): 97.9 Respiratory Rate 18 (breaths/min): Photos: Wound Location: Right Toe - Web between 4th Left Toe - Web between 4th Left Toe - Web between 3rd and 5th and 5th and 4th Wounding Event: Gradually Appeared Pressure Injury Gradually Appeared Primary Etiology: Atypical Pressure Ulcer Fungal Date Acquired: 08/05/2017 10/13/2017  10/16/2017 Weeks of Treatment: 9 3 2  Wound Status: Open Open Open Pending Amputation on Yes No No Presentation: Measurements L x W x D 1.5x3x0.1 3x3x0.1 3x3x0.1 (cm) Area (cm) : 3.534 7.069 7.069 Volume (cm) : 0.353 0.707 0.707 % Reduction in Area: -38.90% -114.30% -50.00% % Reduction in Volume: -39.00% -114.20% -50.10% Classification: Partial Thickness Category/Stage II Partial Thickness Exudate Amount: Large Large Large Exudate Type: Serous Serosanguineous Serosanguineous Exudate Color: amber red, brown red, brown Foul Odor After Cleansing: No Yes Yes Odor Anticipated Due to N/A No No Product Use: Wound Margin: Flat and Intact Flat and Intact Distinct, outline attached Granulation Amount: Large (67-100%) Small (1-33%) Large (67-100%) Granulation Quality: Red Red Red Necrotic Amount: Small (1-33%) Small (1-33%) Small (1-33%) Exposed Structures: Fascia: No Fat Layer (Subcutaneous Fascia: No Fat Layer (Subcutaneous Tissue) Exposed: Yes Fat Layer (Subcutaneous Tissue) Exposed: No Fascia: No Tissue) Exposed: No Cecilio, Leandro A. ([id number]) Tendon: No Tendon: No Tendon: No Muscle: No Muscle: No Muscle: No Joint: No Joint: No Joint: No Bone: No Bone: No Bone: No Epithelialization: None None None Periwound Skin  Texture: Excoriation: Yes Excoriation: No No Abnormalities Noted Induration: Yes Induration: No Callus: No Callus: No Crepitus: No Crepitus: No Rash: No Rash: No Scarring: No Scarring: No Periwound Skin Moisture: Maceration: Yes Maceration: Yes Maceration: Yes Dry/Scaly: No Dry/Scaly: No Periwound Skin Color: Atrophie Blanche: No Atrophie Blanche: No No Abnormalities Noted Cyanosis: No Cyanosis: No Ecchymosis: No Ecchymosis: No Erythema: No Erythema: No Hemosiderin Staining: No Hemosiderin Staining: No Mottled: No Mottled: No Pallor: No Pallor: No Rubor: No Rubor: No Temperature: N/A N/A N/A Tenderness on Palpation: No No No Wound Preparation: Ulcer Cleansing: Ulcer Cleansing: Ulcer Cleansing: Rinsed/Irrigated with Saline Rinsed/Irrigated with Saline Rinsed/Irrigated with Saline Topical Anesthetic Applied: Topical Anesthetic Applied: Topical Anesthetic Applied: Other: lidocaine 4% Other: lidocaine 4% Other: lidocaine 4% Wound Number: [id number]  Photos: No Photos Wound Location: Right Toe - Web between 3rd Left Toe - Web between 2nd Left Foot - Plantar and 4th and 3rd Wounding Event: Gradually Appeared Gradually Appeared Gradually Appeared Primary Etiology: Fungal Fungal Fungal Date Acquired: 10/16/2017 10/23/2017 10/23/2017 Weeks of Treatment: 2 0 0 Wound Status: Open Open Open Pending Amputation on No No No Presentation: Measurements L x W x D 2x3x0.1 2.5x2.5x0.1 1.2x6x0.1 (cm) Area (cm) : 4.712 4.909 5.655 Volume (cm) : 0.471 0.491 0.565 % Reduction in Area: -300.00% N/A N/A % Reduction in Volume: -299.20% N/A N/A Classification: Partial Thickness Full Thickness Without Full Thickness Without Exposed Support Structures Exposed Support Structures Exudate Amount: Large Large Large Exudate Type: Serosanguineous Serosanguineous Serosanguineous Exudate Color: red, brown red, brown red, brown Mahr, Anothony A. ([id number]) Foul Odor After Cleansing: Yes Yes  Yes Odor Anticipated Due to No No No Product Use: Wound Margin: Distinct, outline attached Distinct, outline attached Distinct, outline attached Granulation Amount: Large (67-100%) Large (67-100%) Large (67-100%) Granulation Quality: Red Red Red Necrotic Amount: Small (1-33%) Small (1-33%) Small (1-33%) Exposed Structures: Fascia: No Fascia: No Fascia: No Fat Layer (Subcutaneous Fat Layer (Subcutaneous Fat Layer (Subcutaneous Tissue) Exposed: No Tissue) Exposed: No Tissue) Exposed: No Tendon: No Tendon: No Tendon: No Muscle: No Muscle: No Muscle: No Joint: No Joint: No Joint: No Bone: No Bone: No Bone: No Epithelialization: None None None Periwound Skin Texture: No Abnormalities Noted No Abnormalities Noted No Abnormalities Noted Periwound Skin Moisture: Maceration: Yes Maceration: Yes Maceration: Yes Periwound Skin Color: No Abnormalities Noted No Abnormalities Noted No Abnormalities Noted Temperature: N/A No Abnormality No Abnormality Tenderness on Palpation:  No Yes Yes Wound Preparation: Ulcer Cleansing: Ulcer Cleansing: Ulcer Cleansing: Rinsed/Irrigated with Saline Rinsed/Irrigated with Saline Rinsed/Irrigated with Saline Topical Anesthetic Applied: Topical Anesthetic Applied: Topical Anesthetic Applied: Other: lidocaine 4% Other: lidocaine 4% Other: lidocaine 4% Wound Number: [id number] N/A Photos: N/A Wound Location: Left Foot - Dorsal Left Toe - Web between 1st N/A and 2nd Wounding Event: Gradually Appeared Gradually Appeared N/A Primary Etiology: Fungal Fungal N/A Date Acquired: 10/23/2017 10/23/2017 N/A Weeks of Treatment: 0 0 N/A Wound Status: Open Open N/A Pending Amputation on No No N/A Presentation: Measurements L x W x D 0.5x0.5x0.1 1x1.5x0.1 N/A (cm) Area (cm) : 0.196 1.178 N/A Volume (cm) : 0.02 0.118 N/A % Reduction in Area: N/A 0.00% N/A % Reduction in Volume: N/A 0.00% N/A Classification: Full Thickness Without Full Thickness Without  N/A Exposed Support Structures Exposed Support Structures Exudate Amount: Large Large N/A Exudate Type: Serosanguineous Serosanguineous N/A Exudate Color: red, brown red, brown N/A Foul Odor After Cleansing: Yes Yes N/A No No N/A Hollingsworth, Tramon A. ([id number]) Odor Anticipated Due to Product Use: Wound Margin: Distinct, outline attached Distinct, outline attached N/A Granulation Amount: Large (67-100%) Large (67-100%) N/A Granulation Quality: Red Red N/A Necrotic Amount: None Present (0%) Small (1-33%) N/A Exposed Structures: Fascia: No Fascia: No N/A Fat Layer (Subcutaneous Fat Layer (Subcutaneous Tissue) Exposed: No Tissue) Exposed: No Tendon: No Tendon: No Muscle: No Muscle: No Joint: No Joint: No Bone: No Bone: No Epithelialization: None None N/A Periwound Skin Texture: No Abnormalities Noted No Abnormalities Noted N/A Periwound Skin Moisture: Maceration: Yes Maceration: Yes N/A Periwound Skin Color: No Abnormalities Noted No Abnormalities Noted N/A Temperature: No Abnormality No Abnormality N/A Tenderness on Palpation: Yes Yes N/A Wound Preparation: Ulcer Cleansing: Topical Anesthetic Applied: N/A Rinsed/Irrigated with Saline Other: lidocaine 4% Topical Anesthetic Applied: Other: lidocaine 4% Treatment Notes Wound #1 (Right Toe - Web between 4th and 5th) 1. Cleansed with: Clean wound with Normal Saline 2. Anesthetic Topical Lidocaine 4% cream to wound bed prior to debridement 4. Dressing Applied: Other dressing (specify in notes) 5. Secondary Dressing Applied ABD Pad Dry Gauze Kerlix/Conform 7. Secured with Tape Notes silvercel Wound #3 (Left Toe - Web between 4th and 5th) 1. Cleansed with: Clean wound with Normal Saline 2. Anesthetic Topical Lidocaine 4% cream to wound bed prior to debridement 4. Dressing Applied: Other dressing (specify in notes) 5. Secondary Dressing Applied ABD Pad Dry Gauze Kerlix/Conform 7. Secured with Flori, Satchel A.  ([id number]) Tape Notes silvercel Wound #3 (Left Toe - Web between 4th and 5th) 1. Cleansed with: Clean wound with Normal Saline 2. Anesthetic Topical Lidocaine 4% cream to wound bed prior to debridement 4. Dressing Applied: Other dressing (specify in notes) 5. Secondary Dressing Applied ABD Pad Dry Gauze Kerlix/Conform 7. Secured with Tape Notes silvercel Wound #4 (Left Toe - Web between 3rd and 4th) 1. Cleansed with: Clean wound with Normal Saline 2. Anesthetic Topical Lidocaine 4% cream to wound bed prior to debridement 4. Dressing Applied: Other dressing (specify in notes) 5. Secondary Dressing Applied ABD Pad Dry Gauze Kerlix/Conform 7. Secured with Tape Notes silvercel Wound #4 (Left Toe - Web between 3rd and 4th) 1. Cleansed with: Clean wound with Normal Saline 2. Anesthetic Topical Lidocaine 4% cream to wound bed prior to debridement 4. Dressing Applied: Other dressing (specify in notes) 5. Secondary Dressing Applied ABD Pad Dry Gauze Kerlix/Conform 7. Secured with Tape Notes silvercel Strine, Shields A. ([id number]) Wound #5 (Right Toe - Web between 3rd and 4th) 1. Cleansed with:  Clean wound with Normal Saline 2. Anesthetic Topical Lidocaine 4% cream to wound bed prior to debridement 4. Dressing Applied: Other dressing (specify in notes) 5. Secondary Dressing Applied ABD Pad Dry Gauze Kerlix/Conform 7. Secured with Tape Notes silvercel Wound #5 (Right Toe - Web between 3rd and 4th) 1. Cleansed with: Clean wound with Normal Saline 2. Anesthetic Topical Lidocaine 4% cream to wound bed prior to debridement 4. Dressing Applied: Other dressing (specify in notes) 5. Secondary Dressing Applied ABD Pad Dry Gauze Kerlix/Conform 7. Secured with Tape Notes silvercel Wound #6 (Left Toe - Web between 2nd and 3rd) 1. Cleansed with: Clean wound with Normal Saline 2. Anesthetic Topical Lidocaine 4% cream to wound bed prior to debridement 4.  Dressing Applied: Other dressing (specify in notes) 5. Secondary Dressing Applied ABD Pad Dry Gauze Kerlix/Conform 7. Secured with Tape Notes silvercel Wound #6 (Left Toe - Web between 2nd and 3rd) 1. Cleansed with: Clean wound with Normal Saline 2. Anesthetic Grange, Chanse A. ([id number]) Topical Lidocaine 4% cream to wound bed prior to debridement 4. Dressing Applied: Other dressing (specify in notes) 5. Secondary Dressing Applied ABD Pad Dry Gauze Kerlix/Conform 7. Secured with Tape Notes silvercel Wound #7 (Left, Plantar Foot) 1. Cleansed with: Clean wound with Normal Saline 2. Anesthetic Topical Lidocaine 4% cream to wound bed prior to debridement 4. Dressing Applied: Other dressing (specify in notes) 5. Secondary Dressing Applied ABD Pad Dry Gauze Kerlix/Conform 7. Secured with Tape Notes silvercel Wound #8 (Left, Dorsal Foot) 1. Cleansed with: Clean wound with Normal Saline 2. Anesthetic Topical Lidocaine 4% cream to wound bed prior to debridement 4. Dressing Applied: Other dressing (specify in notes) 5. Secondary Dressing Applied ABD Pad Dry Gauze Kerlix/Conform 7. Secured with Tape Notes silvercel Wound #9 (Left Toe - Web between 1st and 2nd) 1. Cleansed with: Clean wound with Normal Saline 2. Anesthetic Topical Lidocaine 4% cream to wound bed prior to debridement 4. Dressing Applied: Other dressing (specify in notes) 5. Secondary Dressing Applied Khamis, Lourdes A. ([id number]) ABD Pad Dry Gauze Kerlix/Conform 7. Secured with Tape Notes silvercel Electronic Signature(s) Signed: 11/06/2017 5:05:40 PM By: Baltazar Najjarobson, Michael MD Entered By: Baltazar Najjarobson, Michael on 11/06/2017 16:49:16 Shuttleworth, Rehan A. ([id number]) -------------------------------------------------------------------------------- Multi-Disciplinary Care Plan Details Patient Name: Marc DragonARR, Caley A. Date of Service: 11/06/2017 3:45 PM Medical Record Number: [id number] Patient Account Number:  [id number] Date of Birth/Sex: 08/23/1971 [age] y.o. Male) Treating RN: Phillis HaggisPinkerton, Debi Primary Care Lalana Wachter: PATIENT, NO Other Clinician: Referring Erdine Hulen: Minna AntisPADUCHOWSKI, KEVIN Treating Cosandra Plouffe/Extender: Altamese CarolinaOBSON, MICHAEL G Weeks in Treatment: 9 Active Inactive ` Abuse / Safety / Falls / Self Care Management Nursing Diagnoses: Potential for falls Goals: Patient will not experience any injury related to falls Date Initiated: 09/04/2017 Target Resolution Date: 12/21/2017 Goal Status: Active Interventions: Assess impairment of mobility on admission and as needed per policy Assess personal safety and home safety (as indicated) on admission and as needed Assess self care needs on admission and as needed Provide education on basic hygiene Provide education on fall prevention Treatment Activities: Education provided on Basic Hygiene : 09/04/2017 Notes: ` Nutrition Nursing Diagnoses: Imbalanced nutrition Potential for alteratiion in Nutrition/Potential for imbalanced nutrition Goals: Patient/caregiver agrees to and verbalizes understanding of need to use nutritional supplements and/or vitamins as prescribed Date Initiated: 09/04/2017 Target Resolution Date: 12/21/2017 Goal Status: Active Interventions: Assess patient nutrition upon admission and as needed per policy Notes: ` Orientation to the Wound Care Program JeffersonARR, Callen A. ([id number]) Nursing Diagnoses: Knowledge deficit related to the wound healing center program  Goals: Patient/caregiver will verbalize understanding of the Wound Healing Center Program Date Initiated: 09/04/2017 Target Resolution Date: 09/21/2017 Goal Status: Active Interventions: Provide education on orientation to the wound center Notes: ` Pain, Acute or Chronic Nursing Diagnoses: Pain, acute or chronic: actual or potential Potential alteration in comfort, pain Goals: Patient/caregiver will verbalize adequate pain control between visits Date Initiated:  09/04/2017 Target Resolution Date: 11/23/2017 Goal Status: Active Interventions: Complete pain assessment as per visit requirements Notes: ` Soft Tissue Infection Nursing Diagnoses: Impaired tissue integrity Knowledge deficit related to disease process and management Knowledge deficit related to home infection control: handwashing, handling of soiled dressings, supply storage Potential for infection: soft tissue Goals: Patient/caregiver will verbalize understanding of or measures to prevent infection and contamination in the home setting Date Initiated: 09/04/2017 Target Resolution Date: 11/23/2017 Goal Status: Active Signs and symptoms of infection will be recognized early to allow for prompt treatment Date Initiated: 09/04/2017 Target Resolution Date: 12/21/2017 Goal Status: Active Interventions: Assess signs and symptoms of infection every visit Provide education on infection Treatment Activities: Education provided on Infection : 09/04/2017 Paczkowski, Markeith A. ([id number]) Notes: ` Wound/Skin Impairment Nursing Diagnoses: Impaired tissue integrity Knowledge deficit related to smoking impact on wound healing Knowledge deficit related to ulceration/compromised skin integrity Goals: Ulcer/skin breakdown will have a volume reduction of 80% by week 12 Date Initiated: 09/04/2017 Target Resolution Date: 12/21/2017 Goal Status: Active Interventions: Assess patient/caregiver ability to perform ulcer/skin care regimen upon admission and as needed Assess ulceration(s) every visit Provide education on smoking Provide education on ulcer and skin care Notes: Electronic Signature(s) Signed: 11/08/2017 8:00:35 AM By: Alejandro Mulling Entered By: Alejandro Mulling on 11/06/2017 16:16:30 Freid, Humbert A. ([id number]) -------------------------------------------------------------------------------- Pain Assessment Details Patient Name: [name], Leander A. Date of Service: 11/06/2017 3:45 PM Medical  Record Number: [id number] Patient Account Number: [id number] Date of Birth/Sex: April 05, 1971 (47 y.o. Male) Treating RN: Phillis Haggis Primary Care Claudis Giovanelli: PATIENT, NO Other Clinician: Referring Jamol Ginyard: Minna Antis Treating Maekayla Giorgio/Extender: Maxwell Caul Weeks in Treatment: 9 Active Problems Location of Pain Severity and Description of Pain Patient Has Paino Yes Site Locations Rate the pain. Current Pain Level: 3 Character of Pain Describe the Pain: Aching, Throbbing Pain Management and Medication Current Pain Management: Notes Topical or injectable lidocaine is offered to patient for acute pain when surgical debridement is performed. If needed, Patient is instructed to use over the counter pain medication for the following 24-48 hours after debridement. Wound care MDs do not prescribed pain medications. Patient has chronic pain or uncontrolled pain. Patient has been instructed to make an appointment with their Primary Care Physician for pain management. Electronic Signature(s) Signed: 11/08/2017 8:00:35 AM By: Alejandro Mulling Entered By: Alejandro Mulling on 11/06/2017 15:40:57 [place], Glen A. ([id number]) -------------------------------------------------------------------------------- Patient/Caregiver Education Details Patient Name: [name], Fabiano A. Date of Service: 11/06/2017 3:45 PM Medical Record Number: [id number] Patient Account Number: [id number] Date of Birth/Gender: 07-26-1971 (47 y.o. Male) Treating RN: Curtis Sites Primary Care Physician: PATIENT, NO Other Clinician: Referring Physician: Minna Antis Treating Physician/Extender: Altamese Antelope in Treatment: 9 Education Assessment Education Provided To: Patient Education Topics Provided Wound/Skin Impairment: Handouts: Other: wound care as ordered Methods: Demonstration, Explain/Verbal Responses: State content correctly Electronic Signature(s) Signed: 11/06/2017 4:58:37 PM By: Curtis Sites Entered By: Curtis Sites on 11/06/2017 16:49:02 Platter, Samson A. ([id number]) -------------------------------------------------------------------------------- Wound Assessment Details Patient Name: [name], Matthewjames A. Date of Service: 11/06/2017 3:45 PM Medical Record Number: [id number] Patient Account Number: [id number] Date of Birth/Sex: 1971/06/27 (47 y.o. Male) Treating RN:  Ashok Cordia, Debi Primary Care Jeryl Umholtz: PATIENT, NO Other Clinician: Referring Gladyce Mcray: Minna Antis Treating Caelan Atchley/Extender: Altamese [place] in Treatment: 9 Wound Status Wound Number: 1 Primary Etiology: Atypical Wound Location: Right Toe - Web between 4th and 5th Wound Status: Open Wounding Event: Gradually Appeared Date Acquired: 08/05/2017 Weeks Of Treatment: 9 Clustered Wound: No Pending Amputation On Presentation Photos Photo Uploaded By: Alejandro Mulling on 11/06/2017 16:39:40 Wound Measurements Length: (cm) 1.5 Width: (cm) 3 Depth: (cm) 0.1 Area: (cm) 3.534 Volume: (cm) 0.353 % Reduction in Area: -38.9% % Reduction in Volume: -39% Epithelialization: None Tunneling: No Undermining: No Wound Description Classification: Partial Thickness Wound Margin: Flat and Intact Exudate Amount: Large Exudate Type: Serous Exudate Color: amber Foul Odor After Cleansing: No Slough/Fibrino No Wound Bed Granulation Amount: Large (67-100%) Exposed Structure Granulation Quality: Red Fascia Exposed: No Necrotic Amount: Small (1-33%) Fat Layer (Subcutaneous Tissue) Exposed: No Necrotic Quality: Adherent Slough Tendon Exposed: No Muscle Exposed: No Joint Exposed: No Bone Exposed: No Periwound Skin Texture Sroka, Gal A. ([id number]) Texture Color No Abnormalities Noted: No No Abnormalities Noted: No Callus: No Atrophie Blanche: No Crepitus: No Cyanosis: No Excoriation: Yes Ecchymosis: No Induration: Yes Erythema: No Rash: No Hemosiderin Staining: No Scarring: No Mottled:  No Pallor: No Moisture Rubor: No No Abnormalities Noted: No Dry / Scaly: No Maceration: Yes Wound Preparation Ulcer Cleansing: Rinsed/Irrigated with Saline Topical Anesthetic Applied: Other: lidocaine 4%, Electronic Signature(s) Signed: 11/08/2017 8:00:35 AM By: Alejandro Mulling Entered By: Alejandro Mulling on 11/06/2017 15:56:42 Mcalexander, Shad A. ([id number]) -------------------------------------------------------------------------------- Wound Assessment Details Patient Name: [name], Aydien A. Date of Service: 11/06/2017 3:45 PM Medical Record Number: [id number] Patient Account Number: [id number] Date of Birth/Sex: 1971/06/26 (47 y.o. Male) Treating RN: Phillis Haggis Primary Care Lyly Canizales: PATIENT, NO Other Clinician: Referring Alik Mawson: Minna Antis Treating Rebacca Votaw/Extender: Maxwell Caul Weeks in Treatment: 9 Wound Status Wound Number: 3 Primary Etiology: Pressure Ulcer Wound Location: Left Toe - Web between 4th and 5th Wound Status: Open Wounding Event: Pressure Injury Date Acquired: 10/13/2017 Weeks Of Treatment: 3 Clustered Wound: No Photos Photo Uploaded By: Alejandro Mulling on 11/06/2017 16:39:41 Wound Measurements Length: (cm) 3 Width: (cm) 3 Depth: (cm) 0.1 Area: (cm) 7.069 Volume: (cm) 0.707 % Reduction in Area: -114.3% % Reduction in Volume: -114.2% Epithelialization: None Tunneling: No Undermining: No Wound Description Classification: Category/Stage II Wound Margin: Flat and Intact Exudate Amount: Large Exudate Type: Serosanguineous Exudate Color: red, brown Foul Odor After Cleansing: Yes Due to Product Use: No Slough/Fibrino Yes Wound Bed Granulation Amount: Small (1-33%) Exposed Structure Granulation Quality: Red Fascia Exposed: No Necrotic Amount: Small (1-33%) Fat Layer (Subcutaneous Tissue) Exposed: Yes Necrotic Quality: Adherent Slough Tendon Exposed: No Muscle Exposed: No Joint Exposed: No Bone Exposed: No Periwound Skin  Texture Berens, Tavonte A. ([id number]) Texture Color No Abnormalities Noted: No No Abnormalities Noted: No Callus: No Atrophie Blanche: No Crepitus: No Cyanosis: No Excoriation: No Ecchymosis: No Induration: No Erythema: No Rash: No Hemosiderin Staining: No Scarring: No Mottled: No Pallor: No Moisture Rubor: No No Abnormalities Noted: No Dry / Scaly: No Maceration: Yes Wound Preparation Ulcer Cleansing: Rinsed/Irrigated with Saline Topical Anesthetic Applied: Other: lidocaine 4%, Treatment Notes Wound #3 (Left Toe - Web between 4th and 5th) 1. Cleansed with: Clean wound with Normal Saline 2. Anesthetic Topical Lidocaine 4% cream to wound bed prior to debridement 4. Dressing Applied: Other dressing (specify in notes) 5. Secondary Dressing Applied ABD Pad Dry Gauze Kerlix/Conform 7. Secured with Tape Notes silvercel Electronic Signature(s) Signed: 11/08/2017 8:00:35 AM By: Ashok Cordia,  Debra Entered By: Alejandro Mulling on 11/06/2017 15:57:29 Rosenbloom, Wilkes A. ([id number]) -------------------------------------------------------------------------------- Wound Assessment Details Patient Name: Loren, Tammy A. Date of Service: 11/06/2017 3:45 PM Medical Record Number: [id number] Patient Account Number: [id number] Date of Birth/Sex: October 21, 1970 (47 y.o. 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 Wound Status: Open Wounding Event: Gradually Appeared Date Acquired: 10/16/2017 Weeks Of Treatment: 2 Clustered Wound: No Photos Photo Uploaded By: Alejandro Mulling on 11/06/2017 16:40:41 Wound Measurements Length: (cm) 3 Width: (cm) 3 Depth: (cm) 0.1 Area: (cm) 7.069 Volume: (cm) 0.707 % Reduction in Area: -50% %  Reduction in Volume: -50.1% Epithelialization: None Tunneling: No Undermining: No Wound Description Classification: Partial Thickness Wound Margin: Distinct, outline attached Exudate Amount: Large Exudate Type: Serosanguineous Exudate Color: red, brown Foul Odor After Cleansing: Yes Due to Product Use: No Slough/Fibrino Yes Wound Bed Granulation Amount: Large (67-100%) Exposed Structure Granulation Quality: Red Fascia Exposed: No Necrotic Amount: Small (1-33%) Fat Layer (Subcutaneous Tissue) Exposed: No Necrotic Quality: Adherent Slough Tendon Exposed: No Muscle Exposed: No Joint Exposed: No Bone Exposed: No Periwound Skin Texture Niemeier, Iram A. ([id number]) Texture Color No Abnormalities Noted: No No Abnormalities Noted: No Moisture No Abnormalities Noted: No Maceration: Yes Wound Preparation Ulcer Cleansing: Rinsed/Irrigated with Saline Topical Anesthetic Applied: Other: lidocaine 4%, Treatment Notes Wound #4 (Left Toe - Web between 3rd and 4th) 1. Cleansed with: Clean wound with Normal Saline 2. Anesthetic Topical Lidocaine 4% cream to wound bed prior to debridement 4. Dressing Applied: Other dressing (specify in notes) 5. Secondary Dressing Applied ABD Pad Dry Gauze Kerlix/Conform 7. Secured with Tape Notes silvercel Electronic Signature(s) Signed: 11/08/2017 8:00:35 AM By: Alejandro Mulling Entered By: Alejandro Mulling on 11/06/2017 15:57:53 Clauson, Krishav A. ([id number]) -------------------------------------------------------------------------------- Wound Assessment Details Patient Name: [name], Abass A. Date of Service: 11/06/2017 3:45 PM Medical Record Number: [id number] Patient Account Number: [id number] Date of Birth/Sex: 01/30/1971 (47 y.o. 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 Wound Status: Open Wounding Event: Gradually Appeared Date Acquired: 10/16/2017 Weeks Of Treatment: 2 Clustered Wound: No Photos Photo Uploaded By: Alejandro Mulling on 11/06/2017 16:40:42 Wound Measurements Length: (cm) 2 Width: (cm) 3 Depth: (cm) 0.1 Area: (cm) 4.712 Volume: (cm) 0.471 % Reduction in Area: -300% % Reduction in Volume: -299.2% Epithelialization: None Tunneling: No Wound Description Classification: Partial Thickness Wound Margin: Distinct, outline attached Exudate Amount: Large Exudate Type: Serosanguineous Exudate Color: red, brown Foul Odor After Cleansing: Yes Due to Product Use: No Slough/Fibrino Yes Wound Bed Granulation Amount: Large (67-100%) Exposed Structure Granulation Quality: Red Fascia Exposed: No Necrotic Amount: Small (1-33%) Fat Layer (Subcutaneous Tissue) Exposed: No Necrotic Quality: Adherent Slough Tendon Exposed: No Muscle Exposed: No Joint Exposed: No Bone Exposed: No Periwound Skin Texture Sthilaire, Glenroy A. ([id number]) Texture Color No Abnormalities Noted: No No Abnormalities Noted: No Moisture No Abnormalities Noted: No Maceration: Yes Wound Preparation Ulcer Cleansing: Rinsed/Irrigated with Saline Topical Anesthetic Applied: Other: lidocaine 4%, Treatment Notes Wound #5 (Right Toe - Web between 3rd and 4th) 1. Cleansed with: Clean wound with Normal Saline 2. Anesthetic Topical Lidocaine 4% cream to wound bed prior to debridement 4.  Dressing Applied: Other dressing (specify in notes) 5. Secondary Dressing Applied ABD Pad Dry Gauze Kerlix/Conform 7. Secured with Tape Notes silvercel Electronic Signature(s) Signed: 11/08/2017 8:00:35 AM By: Alejandro Mulling Entered By: Alejandro Mulling on 11/06/2017 15:58:43 Halfhill, Sadao A. ([id number]) -------------------------------------------------------------------------------- Wound  Assessment Details Patient Name: [name], Wolfe A. Date of Service: 11/06/2017 3:45 PM Medical Record Number: [id number] Patient Account Number: [id number] Date of Birth/Sex: 06-16-1971 (47 y.o. Male) Treating RN: Phillis Haggis Primary Care Dotti Busey: PATIENT, NO Other Clinician: Referring Marquavious Nazar: Minna Antis Treating Cienna Dumais/Extender: Maxwell Caul Weeks in Treatment: 9 Wound Status Wound Number: 6 Primary Etiology: Fungal Wound Location: Left Toe - Web between 2nd and 3rd Wound Status: Open Wounding Event: Gradually Appeared Date Acquired: 10/23/2017 Weeks Of Treatment: 0 Clustered Wound: No Wound Measurements Length: (cm) 2.5 Width: (cm) 2.5 Depth: (cm) 0.1 Area: (cm) 4.909 Volume: (cm) 0.491 % Reduction in Area: % Reduction in Volume: Epithelialization: None Tunneling: No Undermining: No Wound Description Full Thickness Without Exposed Support Classification: Structures Wound Margin: Distinct, outline attached Exudate Large Amount: Exudate Type: Serosanguineous Exudate Color: red, brown Foul Odor After Cleansing: Yes Due to Product Use: No Slough/Fibrino Yes Wound Bed Granulation Amount: Large (67-100%) Exposed Structure Granulation Quality: Red Fascia Exposed: No Necrotic Amount: Small (1-33%) Fat Layer (Subcutaneous Tissue) Exposed: No Necrotic Quality: Adherent Slough Tendon Exposed: No Muscle Exposed: No Joint Exposed: No Bone Exposed: No Periwound Skin Texture Texture Color No Abnormalities Noted: No No Abnormalities Noted: No Moisture Temperature / Pain No Abnormalities Noted: No Temperature: No Abnormality Maceration: Yes Tenderness on Palpation: Yes Wound Preparation Ulcer Cleansing: Rinsed/Irrigated with Saline Topical Anesthetic Applied: Other: lidocaine 4%, Treatment Notes Goforth, Cullin A. ([id number]) Wound #6 (Left Toe - Web between 2nd and 3rd) 1. Cleansed with: Clean wound with Normal Saline 2. Anesthetic Topical  Lidocaine 4% cream to wound bed prior to debridement 4. Dressing Applied: Other dressing (specify in notes) 5. Secondary Dressing Applied ABD Pad Dry Gauze Kerlix/Conform 7. Secured with Tape Notes silvercel Electronic Signature(s) Signed: 11/08/2017 8:00:35 AM By: Alejandro Mulling Entered By: Alejandro Mulling on 11/06/2017 16:01:02 Wingrove, Owin A. ([id number]) -------------------------------------------------------------------------------- Wound Assessment Details Patient Name: [name], Ardit A. Date of Service: 11/06/2017 3:45 PM Medical Record Number: [id number] Patient Account Number: [id number] Date of Birth/Sex: 1971-05-21 (47 y.o. 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 Wound Status: Open Wounding Event: Gradually Appeared Date Acquired: 10/23/2017 Weeks Of Treatment: 0 Clustered Wound: No Photos Photo Uploaded By: Alejandro Mulling on 11/06/2017 16:41:29 Wound Measurements Length: (cm) 1.2 Width: (cm) 6 Depth: (cm) 0.1 Area: (cm) 5.655 Volume: (cm) 0.565 % Reduction in Area: % Reduction in Volume: Epithelialization: None Tunneling: No Undermining: No Wound Description Full Thickness Without Exposed Support Classification: Structures Wound Margin: Distinct, outline attached Exudate Large Amount: Exudate Type: Serosanguineous Exudate Color: red, brown Foul Odor After Cleansing: Yes Due to Product Use: No Slough/Fibrino Yes Wound Bed Granulation Amount: Large (67-100%) Exposed Structure Granulation Quality: Red Fascia Exposed: No Necrotic Amount: Small (1-33%) Fat Layer (Subcutaneous Tissue) Exposed: No Necrotic Quality: Adherent Slough Tendon Exposed: No Muscle Exposed: No Joint Exposed: No Bone Exposed:  No Lauritsen, Tarron A. ([id number]) Periwound Skin Texture Texture Color No Abnormalities Noted: No No Abnormalities Noted: No Moisture Temperature / Pain No Abnormalities Noted: No Temperature: No Abnormality Maceration: Yes Tenderness on Palpation: Yes Wound Preparation  Ulcer Cleansing: Rinsed/Irrigated with Saline Topical Anesthetic Applied: Other: lidocaine 4%, Electronic Signature(s) Signed: 11/08/2017 8:00:35 AM By: Alejandro Mulling Entered By: Alejandro Mulling on 11/06/2017 16:02:47 Iversen, Alexsander A. ([id number]) -------------------------------------------------------------------------------- Wound Assessment Details Patient Name: [name], Indio A. Date of Service: 11/06/2017 3:45 PM Medical Record Number: [id number] Patient Account Number: [id number] Date of Birth/Sex: [birth year] (47 y.o. 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 Wound Status: Open Wounding Event: Gradually Appeared Date Acquired: 10/23/2017 Weeks Of Treatment: 0 Clustered Wound: No Photos Photo Uploaded By: Alejandro Mulling on 11/06/2017 16:41:30 Wound Measurements Length: (cm) 0.5 Width: (cm) 0.5 Depth: (cm) 0.1 Area: (cm) 0.196 Volume: (cm) 0.02 % Reduction in Area: % Reduction in Volume: Epithelialization: None Tunneling: No Undermining: No Wound Description Full Thickness Without Exposed Support Classification: Structures Wound Margin: Distinct, outline attached Exudate Large Amount: Exudate Type: Serosanguineous Exudate Color: red, brown Foul Odor After Cleansing: Yes Due to Product Use: No Slough/Fibrino Yes Wound Bed Granulation Amount: Large (67-100%) Exposed Structure Granulation Quality: Red Fascia Exposed: No Necrotic Amount: None Present  (0%) Fat Layer (Subcutaneous Tissue) Exposed: No Tendon Exposed: No Muscle Exposed: No Joint Exposed: No Bone Exposed: No Bleiler, Nuel A. ([id number]) Periwound Skin Texture Texture Color No Abnormalities Noted: No No Abnormalities Noted: No Moisture Temperature / Pain No Abnormalities Noted: No Temperature: No Abnormality Maceration: Yes Tenderness on Palpation: Yes Wound Preparation Ulcer Cleansing: Rinsed/Irrigated with Saline Topical Anesthetic Applied: Other: lidocaine 4%, Treatment Notes Wound #8 (Left, Dorsal Foot) 1. Cleansed with: Clean wound with Normal Saline 2. Anesthetic Topical Lidocaine 4% cream to wound bed prior to debridement 4. Dressing Applied: Other dressing (specify in notes) 5. Secondary Dressing Applied ABD Pad Dry Gauze Kerlix/Conform 7. Secured with Tape Notes silvercel Electronic Signature(s) Signed: 11/08/2017 8:00:35 AM By: Alejandro Mulling Entered By: Alejandro Mulling on 11/06/2017 16:04:13 Weltman, Willy A. ([id number]) -------------------------------------------------------------------------------- Wound Assessment Details Patient Name: [name], Jahon A. Date of Service: 11/06/2017 3:45 PM Medical Record Number: [id number] Patient Account Number: [id number] Date of Birth/Sex: [birth year] (47 y.o. Male) Treating RN: Phillis Haggis Primary Care Denitra Donaghey: PATIENT, NO Other Clinician: Referring Aby Gessel: Minna Antis Treating Presley Summerlin/Extender: Maxwell Caul Weeks in Treatment: 9 Wound Status Wound Number: 9 Primary Etiology: Fungal Wound Location: Left Toe - Web between 1st and 2nd Wound Status: Open Wounding Event: Gradually Appeared Date Acquired: 10/23/2017 Weeks Of Treatment: 0 Clustered Wound: No Photos Photo Uploaded By: Alejandro Mulling on 11/06/2017 16:42:00 Wound Measurements Length: (cm) 1 Width: (cm) 1.5 Depth: (cm) 0.1 Area: (cm) 1.178 Volume: (cm) 0.118 % Reduction in Area: 0% % Reduction in Volume:  0% Epithelialization: None Tunneling: No Undermining: No Wound Description Full Thickness Without Exposed Support Classification: Structures Wound Margin: Distinct, outline attached Exudate Large Amount: Exudate Type: Serosanguineous Exudate Color: red, brown Foul Odor After Cleansing: Yes Due to Product Use: No Slough/Fibrino Yes Wound Bed Granulation Amount: Large (67-100%) Exposed Structure Granulation Quality: Red Fascia Exposed: No Necrotic Amount: Small (1-33%) Fat Layer (Subcutaneous Tissue) Exposed: No Necrotic Quality: Adherent Slough Tendon Exposed: No Muscle Exposed: No Joint Exposed: No Bone Exposed: No Trauger, Dandre A. ([id number]) Periwound Skin Texture Texture Color No Abnormalities Noted: No No Abnormalities Noted: No Moisture Temperature / Pain No Abnormalities Noted: No Temperature: No Abnormality Maceration: Yes Tenderness on Palpation: Yes Wound Preparation Topical Anesthetic Applied: Other: lidocaine 4%, Treatment Notes Wound #  9 (Left Toe - Web between 1st and 2nd) 1. Cleansed with: Clean wound with Normal Saline 2. Anesthetic Topical Lidocaine 4% cream to wound bed prior to debridement 4. Dressing Applied: Other dressing (specify in notes) 5. Secondary Dressing Applied ABD Pad Dry Gauze Kerlix/Conform 7. Secured with Tape Notes silvercel Electronic Signature(s) Signed: 11/08/2017 8:00:35 AM By: Alejandro Mulling Entered By: Alejandro Mulling on 11/06/2017 16:09:40 Devan, Samit A. ([id number]) -------------------------------------------------------------------------------- Vitals Details Patient Name: [name], Connar A. Date of Service: 11/06/2017 3:45 PM Medical Record Number: [id number] Patient Account Number: [id number] Date of Birth/Sex: 08/20/1971 (47 y.o. Male) Treating RN: Phillis Haggis Primary Care Kenesha Moshier: PATIENT, NO Other Clinician: Referring Mirelle Biskup: Minna Antis Treating Herrick Hartog/Extender: Maxwell Caul Weeks in  Treatment: 9 Vital Signs Time Taken: 15:41 Temperature (F): 97.9 Height (in): 71 Pulse (bpm): 65 Weight (lbs): 180 Respiratory Rate (breaths/min): 18 Body Mass Index (BMI): 25.1 Blood Pressure (mmHg): 124/83 Reference Range: 80 - 120 mg / dl Electronic Signature(s) Signed: 11/08/2017 8:00:35 AM By: Alejandro Mulling Entered By: Alejandro Mulling on 11/06/2017 15:43:03

## 2017-11-10 LAB — AEROBIC CULTURE W GRAM STAIN (SUPERFICIAL SPECIMEN)

## 2017-11-10 LAB — AEROBIC CULTURE  (SUPERFICIAL SPECIMEN)

## 2017-11-13 ENCOUNTER — Encounter: Payer: No Typology Code available for payment source | Admitting: Internal Medicine

## 2017-11-13 DIAGNOSIS — L97521 Non-pressure chronic ulcer of other part of left foot limited to breakdown of skin: Secondary | ICD-10-CM | POA: Diagnosis not present

## 2017-11-14 NOTE — Progress Notes (Addendum)
[name], [name] ([id number]) Visit Report for 11/13/2017 HPI Details Patient Name: [name], [name] Date of Service: 11/13/2017 2:15 PM Medical Record Number: [id number] Patient Account Number: [id number] Date of Birth/Sex: October 31, 1970 (47 y.o. Male) Treating RN: Cornell Barman Primary Care Provider: PATIENT, NO Other Clinician: Referring Provider: Harvest Dark Treating Provider/Extender: Tito Dine in Treatment: 10 History of Present Illness HPI Description: 09/04/17; this is a 47 year old man who was not a diabetic and has no known history of peripheral vascular disease. He does have hyperlipidemia and a history of cocaine abuse although he insists that this is not active. He tells me that about a month ago he started having denuded skin between the toes of his left foot and on the plantar aspect of his left foot. He was picking at the skin and pulling it off. More recently he has had involvement of the toes on the right foot. Week or 2 ago he started getting swelling and pain in the left foot and he was seen in the ER on 08/29/17 he was diagnosed with cellulitis. The only ulcer that they commented on was the left fifth toe. An x-ray suggested ulcer at the fifth MTP slightly osteopenic appearance of the fifth met head question early osteo. He has 14 days of Bactrim DS prescribed and the patient states the swelling and pain in the left foot is improved since starting this 6 days ago. ABIs in this clinic were 1.28 on the right and 1.3 on the left. The patient is a smoker at one half pack per day 09/11/17; patient came last week and in general I think he had severe tinea pedis with secondary bacterial cellulitis. He was on Septra DS and is completed this. I gave him oral terbinafine for the tinea pedis. A culture I did on the left foot last week grew Pseudomonas which is quinolone sensitive. I'll give him 10 days of ciprofloxacin today. However he arrives with his foot looking a lot  better. There is less erythema and less warmth 09/18/17; patient has completed this systemic antifungal therapy and antibiotics for secondary bacterial cellulitis. He is using Lotrimin between his toes and silver alginate. All of this looks a lot better 10/02/17;everything is cleaned up in the patient's bilateral feet except for the right fourth fifth toe web space. The patient had severe tinea pedis with secondary cellulitis. Multiple denuded areas between his toes. We have treated him successfully for all of this however he still has 1 remaining area 10/16/17; patient arrives after a 2 week hiatus he is back at work. Using Lotrimin cream and silver alginate between his toes. He tells me that in the last 2 days he's had denuded areas of skin that he is pulled off. There is no evidence of secondary infection or cellulitis. Not really a lot of evidence of tinea pedis between the toes either. 10/23/17; the patient still has extensive skin breakdown between the bilateral fourth fifth, third fourth and even into the second third toe on the right is been using silver alginate. There is no evidence of secondary infection or cellulitis however this is a lot worse than a week ago 11/06/17; the patient arrives today with his left greater than right foot in not a very good condition. He has denuded skin from basically the base of the second to the fifth toe on the left with skin loss on all his toe webspaces in that distribution. More concerning once again he has erythema in the forefoot which I think  is secondary cellulitis there is an odor and a greenish discoloration probably pseudomonas again. oOn the right foot he has loss of skin in the webspace between the fifth and fourth and the fourth and third toes. Although they state there is an odor here I didn't recognize it. oHe also brings up than when he first went to the ER just before he came here they did an x-ray that suggested the possibility of early  osteomyelitis in the fifth toe on the left. He will need x-rays of this foot again. I will x-ray the right foot as well. He is not a diabetic and does not have PVD oHe is complaining of pain on the lateral part of his foot at the heel but he thinks this might be from walking on the side of his foot to avoid the discomfort of the left forefoot. He has a history of recurrent tinea pedis as teenager when he was in sports. The areas initially responded to oral antibiotics and oral antifungals. Culture of this I did showed pseudomonas last time and the greenish discoloration certainly makes that a possibility. I gave him oral ciprofloxacin for 10 days 500 twice a day and terbinafine 250 a day for 2 weeks. oHe has been using topical ketoconazole for quite a period of time now and silver alginate which also should have antifungal properties. 11/13/17; the patient is not doing well. The area on the right foot looks better however the area on the left does not. He is lost to needed skin on the plantar surface of the foot with loss of skin between all of his toes. There is weeping edema fluid. He complains of itching and burning. I gave him terbinafine last week along with ciprofloxacin to cover the Pseudomonas he [place], [place]. ([id number]) cultured when he first came to this clinic however cultures showed pseudomonas and MRSA. X-ray of the foot did not show any osseous abnormalities. This weekend he moved and was up and down stairs in the foot which I'm sure didn't do this any any good however this is not doing well. When he first came to the clinic he cleaned up and about a month ago I sent him back to work he really appeared in the clinic 2 weeks later with his foot a lot worse. I had been using topical ketoconazole along with silver alginate which should be antifungal as well as drying however this is not work on this occasion not at least on the left foot. Electronic Signature(s) Signed: 11/13/2017  5:07:14 PM By: Linton Ham MD Entered By: Linton Ham on 11/13/2017 15:11:43 [name], [name] ([id number]) -------------------------------------------------------------------------------- Physical Exam Details Patient Name: [name], [name] Date of Service: 11/13/2017 2:15 PM Medical Record Number: [id number] Patient Account Number: [id number] Date of Birth/Sex: 12-Sep-1971 (47 y.o. Male) Treating RN: Cornell Barman Primary Care Provider: PATIENT, NO Other Clinician: Referring Provider: Harvest Dark Treating Provider/Extender: Ricard Dillon Weeks in Treatment: 10 Constitutional Sitting or standing Blood Pressure is within target range for patient.. Pulse regular and within target range for patient.Marland Kitchen Respirations regular, non-labored and within target range.. Temperature is normal and within the target range for the patient.Marland Kitchen appears in no distress. Notes Wound exam; oStill an extensive amount of skin loss in the left forefoot. This involves essentially the base of all of his toes and between the toes. There is weeping edema fluid coming out of the bottom of his foot. I cultured the bottom of his foot last time which showed pseudomonas and  MRSA oThe tender erythema in the forefoot on the left however looks better oBetween the fourth and fifth and third and fourth toes on the right there is a similar degree of tissue loss but not nearly as bad as on the left Electronic Signature(s) Signed: 11/13/2017 5:07:14 PM By: Linton Ham MD Entered By: Linton Ham on 11/13/2017 15:13:11 [name], [name] ([id number]) -------------------------------------------------------------------------------- Physician Orders Details Patient Name: [name], [name] Date of Service: 11/13/2017 2:15 PM Medical Record Number: [id number] Patient Account Number: [id number] Date of Birth/Sex: January 07, 1971 (47 y.o. Male) Treating RN: Cornell Barman Primary Care Provider: PATIENT, NO Other Clinician: Referring  Provider: Harvest Dark Treating Provider/Extender: Tito Dine in Treatment: 10 Verbal / Phone Orders: No Diagnosis Coding Wound Cleansing Wound #1 Right Toe - Web between 4th and 5th o Clean wound with Normal Saline. Wound #3 Left Toe - Web between 4th and 5th o Clean wound with Normal Saline. Wound #4 Left Toe - Web between 3rd and 4th o Clean wound with Normal Saline. Wound #5 Right Toe - Web between 3rd and 4th o Clean wound with Normal Saline. Wound #6 Left Toe - Web between 2nd and 3rd o Clean wound with Normal Saline. Wound #7 Left,Plantar Foot o Clean wound with Normal Saline. Wound #8 Left,Dorsal Foot o Clean wound with Normal Saline. Wound #9 Left Toe - Web between 1st and 2nd o Clean wound with Normal Saline. Primary Wound Dressing Wound #1 Right Toe - Web between 4th and 5th o Silvercel Non-Adherent Wound #3 Left Toe - Web between 4th and 5th o Silvercel Non-Adherent Wound #4 Left Toe - Web between 3rd and 4th o [place] Non-Adherent Wound #5 Right Toe - Web between 3rd and 4th o [place] Non-Adherent Wound #6 Left Toe - Web between 2nd and 3rd o Silvercel Non-Adherent Wound #7 Left,Plantar Foot o Silvercel Non-Adherent Wound #8 Left,Dorsal Foot o Silvercel Non-Adherent [place], Jameison A. ([id number]) Wound #9 Left Toe - Web between 1st and 2nd o Silvercel Non-Adherent Secondary Dressing Wound #1 Right Toe - Web between 4th and 5th o ABD and Kerlix/Conform Wound #3 Left Toe - Web between 4th and 5th o ABD and Kerlix/Conform Wound #4 Left Toe - Web between 3rd and 4th o ABD and Kerlix/Conform Wound #5 Right Toe - Web between 3rd and 4th o ABD and Kerlix/Conform Wound #6 Left Toe - Web between 2nd and 3rd o ABD and Kerlix/Conform Wound #7 Left,Plantar Foot o ABD and Kerlix/Conform Wound #8 Left,Dorsal Foot o ABD and Kerlix/Conform Wound #9 Left Toe - Web between 1st and 2nd o ABD and  Kerlix/Conform Dressing Change Frequency Wound #1 Right Toe - Web between 4th and 5th o Change dressing every other day. Wound #3 Left Toe - Web between 4th and 5th o Change dressing every other day. Wound #4 Left Toe - Web between 3rd and 4th o Change dressing every other day. Wound #5 Right Toe - Web between 3rd and 4th o Change dressing every other day. Wound #6 Left Toe - Web between 2nd and 3rd o Change dressing every other day. Wound #7 Left,Plantar Foot o Change dressing every other day. Wound #8 Left,Dorsal Foot o Change dressing every other day. Wound #9 Left Toe - Web between 1st and 2nd o Change dressing every other day. Follow-up Appointments Wound #1 Right Toe - Web between 4th and 5th [place], Jameal A. ([id number]) o Return Appointment in 1 week. Wound #3 Left Toe - Web between 4th and 5th o Return Appointment  in 1 week. Wound #4 Left Toe - Web between 3rd and 4th o Return Appointment in 1 week. Wound #5 Right Toe - Web between 3rd and 4th o Return Appointment in 1 week. Wound #6 Left Toe - Web between 2nd and 3rd o Return Appointment in 1 week. Wound #7 Left,Plantar Foot o Return Appointment in 1 week. Wound #8 Left,Dorsal Foot o Return Appointment in 1 week. Wound #9 Left Toe - Web between 1st and 2nd o Return Appointment in 1 week. Off-Loading Wound #1 Right Toe - Web between 4th and 5th o Other: - Keep pressure off feet Wound #3 Left Toe - Web between 4th and 5th o Other: - Keep pressure off feet Wound #4 Left Toe - Web between 3rd and 4th o Other: - Keep pressure off feet Wound #5 Right Toe - Web between 3rd and 4th o Other: - Keep pressure off feet Wound #6 Left Toe - Web between 2nd and 3rd o Other: - Keep pressure off feet Wound #7 Left,Plantar Foot o Other: - Keep pressure off feet Wound #8 Left,Dorsal Foot o Other: - Keep pressure off feet Wound #9 Left Toe - Web between 1st and 2nd o Other: - Keep  pressure off feet Medications-please add to medication list. Wound #1 Right Toe - Web between 4th and 5th o P.O. Antibiotics Wound #3 Left Toe - Web between 4th and 5th o P.O. Antibiotics Wound #4 Left Toe - Web between 3rd and 4th o P.O. Antibiotics [name], [name] ([id number]) Wound #5 Right Toe - Web between 3rd and 4th o P.O. Antibiotics Wound #6 Left Toe - Web between 2nd and 3rd o P.O. Antibiotics Wound #7 Left,Plantar Foot o P.O. Antibiotics Wound #8 Left,Dorsal Foot o P.O. Antibiotics Wound #9 Left Toe - Web between 1st and 2nd o P.O. Antibiotics Consults o Podiatry - [facility] Patient Medications Allergies: NKDA Notifications Medication Indication Start End doxycycline monohydrate cellulitis 11/13/2017 DOSE oral 100 mg capsule - 1 capsule oral bid for 7days Cipro 11/13/2017 DOSE oral 500 mg tablet - 1 tablet oral bid for an additional 5 days Electronic Signature(s) Signed: 11/13/2017 3:06:27 PM By: Linton Ham MD Entered By: Linton Ham on 11/13/2017 15:06:25 [name], [name] ([id number]) -------------------------------------------------------------------------------- Problem List Details Patient Name: [name], [name] Date of Service: 11/13/2017 2:15 PM Medical Record Number: [id number] Patient Account Number: [id number] Date of Birth/Sex: 1971/08/01 (47 y.o. 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 ([id number]) -------------------------------------------------------------------------------- Progress Note Details Patient Name: [name], [name] Date of Service: 11/13/2017 2:15 PM Medical Record Number: [id number] Patient Account Number: [id number] Date of Birth/Sex: [birth year] (47 y.o. Male) Treating RN: Cornell Barman Primary Care Provider: PATIENT, NO Other Clinician: Referring Provider: Harvest Dark Treating Provider/Extender: Ricard Dillon Weeks in Treatment: 10 Subjective History of Present Illness (HPI) 09/04/17; this is a 47 year old man who was not a diabetic and has no known history of peripheral vascular disease. He does have hyperlipidemia and a history of cocaine abuse although he insists that this is not active. He tells  me that about a month ago he started having denuded skin between the toes of his left foot and on the plantar aspect of his left foot. He was picking at the skin and pulling it off. More recently he has had involvement of the toes on the right foot. Week or 2 ago he started getting swelling and pain in the left foot and he was seen in the ER on 08/29/17 he was diagnosed with cellulitis. The only ulcer that they commented on was the left fifth toe. An x-ray suggested ulcer at the fifth MTP slightly osteopenic appearance of the fifth met head question early osteo. He has 14 days of Bactrim DS prescribed and the patient states the swelling and pain in the left foot is improved since starting this 6 days ago. ABIs in this clinic were 1.28 on the right and 1.3 on the left. The patient is a smoker at one half pack per day 09/11/17; patient came last week and in general I think he had severe tinea pedis with secondary bacterial cellulitis. He was on Septra DS and is completed this. I gave him oral terbinafine for the tinea pedis. A culture I did on the left foot last week grew Pseudomonas which is quinolone sensitive. I'll give  him 10 days of ciprofloxacin today. However he arrives with his foot looking a lot better. There is less erythema and less warmth 09/18/17; patient has completed this systemic antifungal therapy and antibiotics for secondary bacterial cellulitis. He is using Lotrimin between his toes and silver alginate. All of this looks a lot better 10/02/17;everything is cleaned up in the patient's bilateral feet except for the right fourth fifth toe web space. The patient had severe tinea pedis with secondary cellulitis. Multiple denuded areas between his toes. We have treated him successfully for all of this however he still has 1 remaining area 10/16/17; patient arrives after a 2 week hiatus he is back at work. Using Lotrimin cream and silver alginate between his toes. He tells me that in the last 2 days he's had denuded areas of skin that he is pulled off. There is no evidence of secondary infection or cellulitis. Not really a lot of evidence of tinea pedis between the toes either. 10/23/17; the patient still has extensive skin breakdown between the bilateral fourth fifth, third fourth and even into the second third toe on the right is been using silver alginate. There is no evidence of secondary infection or cellulitis however this is a lot worse than a week ago 11/06/17; the patient arrives today with his left greater than right foot in not a very good condition. He has denuded skin from basically the base of the second to the fifth toe on the left with skin loss on all his toe webspaces in that distribution. More concerning once again he has erythema in the forefoot which I think is secondary cellulitis there is an odor and a greenish discoloration probably pseudomonas again. On the right foot he has loss of skin in the webspace between the fifth and fourth and the fourth and third toes. Although they state there is an odor here I didn't recognize it. He also brings up than when he first went to the ER just  before he came here they did an x-ray that suggested the possibility of early osteomyelitis in the fifth toe on the left. He will need x-rays of this foot again. I will x-ray the right foot as well. He is not a diabetic  and does not have PVD He is complaining of pain on the lateral part of his foot at the heel but he thinks this might be from walking on the side of his foot to avoid the discomfort of the left forefoot. He has a history of recurrent tinea pedis as teenager when he was in sports. The areas initially responded to oral antibiotics and oral antifungals. Culture of this I did showed pseudomonas last time and the greenish discoloration certainly makes that a possibility. I gave him oral ciprofloxacin for 10 days 500 twice a day and terbinafine 250 a day for 2 weeks. He has been using topical ketoconazole for quite a period of time now and silver alginate which also should have antifungal properties. 11/13/17; the patient is not doing well. The area on the right foot looks better however the area on the left does not. He is lost to needed skin on the plantar surface of the foot with loss of skin between all of his toes. There is weeping edema fluid. He complains of itching and burning. I gave him terbinafine last week along with ciprofloxacin to cover the Pseudomonas he cultured when he first came to this clinic however cultures showed pseudomonas and MRSA. X-ray of the foot did not show any osseous abnormalities. This weekend he moved and was up and down stairs in the foot which I'm sure didn't do this any [name], [name] ([id number]) any good however this is not doing well. When he first came to the clinic he cleaned up and about a month ago I sent him back to work he really appeared in the clinic 2 weeks later with his foot a lot worse. I had been using topical ketoconazole along with silver alginate which should be antifungal as well as drying however this is not work on this occasion  not at least on the left foot. Objective Constitutional Sitting or standing Blood Pressure is within target range for patient.. Pulse regular and within target range for patient.Marland Kitchen Respirations regular, non-labored and within target range.. Temperature is normal and within the target range for the patient.Marland Kitchen appears in no distress. Vitals Time Taken: 2:32 PM, Height: 71 in, Weight: 180 lbs, BMI: 25.1, Temperature: 97.7 F, Pulse: 67 bpm, Respiratory Rate: 18 breaths/min, Blood Pressure: 120/75 mmHg. General Notes: Wound exam; Still an extensive amount of skin loss in the left forefoot. This involves essentially the base of all of his toes and between the toes. There is weeping edema fluid coming out of the bottom of his foot. I cultured the bottom of his foot last time which showed pseudomonas and MRSA The tender erythema in the forefoot on the left however looks better Between the fourth and fifth and third and fourth toes on the right there is a similar degree of tissue loss but not nearly as bad as on the left Integumentary (Hair, Skin) Wound #1 status is Open. Original cause of wound was Gradually Appeared. The wound is located on the Right Toe - Web between 4th and 5th. The wound measures 1.5cm length x 3cm width x 0.1cm depth; 3.534cm^2 area and 0.353cm^3 volume. There is no tunneling or undermining noted. There is a large amount of serous drainage noted. The wound margin is flat and intact. There is large (67-100%) red granulation within the wound bed. There is a small (1-33%) amount of necrotic tissue within the wound bed including Adherent Slough. The periwound skin appearance exhibited: Excoriation, Induration, Maceration. The periwound skin appearance did not exhibit:  Callus, Crepitus, Rash, Scarring, Dry/Scaly, Atrophie Blanche, Cyanosis, Ecchymosis, Hemosiderin Staining, Mottled, Pallor, Rubor, Erythema. Periwound temperature was noted as No Abnormality. The periwound has tenderness  on palpation. Wound #3 status is Open. Original cause of wound was Pressure Injury. The wound is located on the Left Toe - Web between 4th and 5th. The wound measures 4cm length x 2cm width x 0.1cm depth; 6.283cm^2 area and 0.628cm^3 volume. There is Fat Layer (Subcutaneous Tissue) Exposed exposed. There is no tunneling or undermining noted. There is a large amount of serosanguineous drainage noted. The wound margin is flat and intact. There is large (67-100%) red granulation within the wound bed. There is a small (1-33%) amount of necrotic tissue within the wound bed including Adherent Slough. The periwound skin appearance exhibited: Excoriation, Maceration. The periwound skin appearance did not exhibit: Callus, Crepitus, Induration, Rash, Scarring, Dry/Scaly, Atrophie Blanche, Cyanosis, Ecchymosis, Hemosiderin Staining, Mottled, Pallor, Rubor, Erythema. Periwound temperature was noted as No Abnormality. The periwound has tenderness on palpation. Wound #4 status is Open. Original cause of wound was Gradually Appeared. The wound is located on the Left Toe - Web between 3rd and 4th. The wound measures 3.5cm length x 2cm width x 0.1cm depth; 5.498cm^2 area and 0.55cm^3 volume. There is no tunneling or undermining noted. There is a large amount of serosanguineous drainage noted. The wound margin is distinct with the outline attached to the wound base. There is large (67-100%) red granulation within the wound bed. There is a small (1-33%) amount of necrotic tissue within the wound bed including Adherent Slough. The periwound skin appearance exhibited: Excoriation, Maceration. The periwound skin appearance did not exhibit: Callus, Crepitus, Induration, Rash, Scarring, Dry/Scaly, Atrophie Blanche, Cyanosis, Ecchymosis, Hemosiderin Staining, Mottled, Pallor, Rubor, Erythema. Periwound temperature was noted as No Abnormality. The periwound has tenderness on palpation. [place], [place] ([id number]) Wound #5  status is Open. Original cause of wound was Gradually Appeared. The wound is located on the Right Toe - Web between 3rd and 4th. The wound measures 3cm length x 2cm width x 0.1cm depth; 4.712cm^2 area and 0.471cm^3 volume. There is no tunneling or undermining noted. There is a large amount of serosanguineous drainage noted. The wound margin is distinct with the outline attached to the wound base. There is large (67-100%) red granulation within the wound bed. There is a small (1-33%) amount of necrotic tissue within the wound bed including Adherent Slough. The periwound skin appearance exhibited: Excoriation, Maceration. The periwound skin appearance did not exhibit: Callus, Crepitus, Induration, Rash, Scarring, Dry/Scaly, Atrophie Blanche, Cyanosis, Ecchymosis, Hemosiderin Staining, Mottled, Pallor, Rubor, Erythema. Periwound temperature was noted as No Abnormality. The periwound has tenderness on palpation. Wound #6 status is Open. Original cause of wound was Gradually Appeared. The wound is located on the Left Toe - Web between 2nd and 3rd. The wound measures 4cm length x 2.5cm width x 0.1cm depth; 7.854cm^2 area and 0.785cm^3 volume. There is no tunneling or undermining noted. There is a large amount of serous drainage noted. Foul odor after cleansing was noted. The wound margin is distinct with the outline attached to the wound base. There is large (67-100%) red granulation within the wound bed. There is a small (1-33%) amount of necrotic tissue within the wound bed including Adherent Slough. The periwound skin appearance exhibited: Maceration. Periwound temperature was noted as No Abnormality. The periwound has tenderness on palpation. Wound #7 status is Open. Original cause of wound was Gradually Appeared. The wound is located on the [place]. The wound  measures 1.5cm length x 6.5cm width x 0.1cm depth; 7.658cm^2 area and 0.766cm^3 volume. There is no tunneling or undermining noted.  There is a large amount of serous drainage noted. Foul odor after cleansing was noted. The wound margin is distinct with the outline attached to the wound base. There is large (67-100%) red granulation within the wound bed. There is a small (1-33%) amount of necrotic tissue within the wound bed including Adherent Slough. The periwound skin appearance exhibited: Maceration. Periwound temperature was noted as No Abnormality. The periwound has tenderness on palpation. Wound #8 status is Open. Original cause of wound was Gradually Appeared. The wound is located on the Left,Dorsal Foot. The wound measures 5cm length x 2cm width x 0.1cm depth; 7.854cm^2 area and 0.785cm^3 volume. There is a large amount of serous drainage noted. Foul odor after cleansing was noted. The wound margin is distinct with the outline attached to the wound base. There is large (67-100%) red granulation within the wound bed. There is no necrotic tissue within the wound bed. The periwound skin appearance exhibited: Maceration. Periwound temperature was noted as No Abnormality. The periwound has tenderness on palpation. Wound #9 status is Open. Original cause of wound was Gradually Appeared. The wound is located on the Left Toe - Web between 1st and 2nd. The wound measures 4.5cm length x 1.2cm width x 0.1cm depth; 4.241cm^2 area and 0.424cm^3 volume. There is no tunneling or undermining noted. There is a large amount of serous drainage noted. Foul odor after cleansing was noted. The wound margin is distinct with the outline attached to the wound base. There is large (67-100%) red granulation within the wound bed. There is a small (1-33%) amount of necrotic tissue within the wound bed including Adherent Slough. The periwound skin appearance exhibited: Maceration. Periwound temperature was noted as No Abnormality. The periwound has tenderness on palpation. Assessment Active Problems ICD-10 L97.522 - Non-pressure chronic ulcer of  other part of left foot with fat layer exposed L03.116 - Cellulitis of left lower limb B35.3 - Tinea pedis [name], [name] ([id number]) Plan Wound Cleansing: Wound #1 Right Toe - Web between 4th and 5th: Clean wound with Normal Saline. Wound #3 Left Toe - Web between 4th and 5th: Clean wound with Normal Saline. Wound #4 Left Toe - Web between 3rd and 4th: Clean wound with Normal Saline. Wound #5 Right Toe - Web between 3rd and 4th: Clean wound with Normal Saline. Wound #6 Left Toe - Web between 2nd and 3rd: Clean wound with Normal Saline. Wound #7 Left,Plantar Foot: Clean wound with Normal Saline. Wound #8 Left,Dorsal Foot: Clean wound with Normal Saline. Wound #9 Left Toe - Web between 1st and 2nd: Clean wound with Normal Saline. Primary Wound Dressing: Wound #1 Right Toe - Web between 4th and 5th: [place] Non-Adherent Wound #3 Left Toe - Web between 4th and 5th: [place] Non-Adherent Wound #4 Left Toe - Web between 3rd and 4th: [place] Non-Adherent Wound #5 Right Toe - Web between 3rd and 4th: [place] Non-Adherent Wound #6 Left Toe - Web between 2nd and 3rd: [place] Non-Adherent Wound #7 Left,Plantar Foot: [place] Non-Adherent Wound #8 Left,Dorsal Foot: [place] Non-Adherent Wound #9 Left Toe - Web between 1st and 2nd: [place] Non-Adherent Secondary Dressing: Wound #1 Right Toe - Web between 4th and 5th: ABD and Kerlix/Conform Wound #3 Left Toe - Web between 4th and 5th: ABD and Kerlix/Conform Wound #4 Left Toe - Web between 3rd and 4th: ABD and Kerlix/Conform Wound #5 Right Toe - Web between  3rd and 4th: ABD and Kerlix/Conform Wound #6 Left Toe - Web between 2nd and 3rd: ABD and Kerlix/Conform Wound #7 Left,Plantar Foot: ABD and Kerlix/Conform Wound #8 Left,Dorsal Foot: ABD and Kerlix/Conform Wound #9 Left Toe - Web between 1st and 2nd: ABD and Kerlix/Conform Dressing Change Frequency: Wound #1 Right Toe - Web between 4th and 5th: Change  dressing every other day. Wound #3 Left Toe - Web between 4th and 5th: Change dressing every other day. Breeze, Lavin A. ([id number]) Wound #4 Left Toe - Web between 3rd and 4th: Change dressing every other day. Wound #5 Right Toe - Web between 3rd and 4th: Change dressing every other day. Wound #6 Left Toe - Web between 2nd and 3rd: Change dressing every other day. Wound #7 Left,Plantar Foot: Change dressing every other day. Wound #8 Left,Dorsal Foot: Change dressing every other day. Wound #9 Left Toe - Web between 1st and 2nd: Change dressing every other day. Follow-up Appointments: Wound #1 Right Toe - Web between 4th and 5th: Return Appointment in 1 week. Wound #3 Left Toe - Web between 4th and 5th: Return Appointment in 1 week. Wound #4 Left Toe - Web between 3rd and 4th: Return Appointment in 1 week. Wound #5 Right Toe - Web between 3rd and 4th: Return Appointment in 1 week. Wound #6 Left Toe - Web between 2nd and 3rd: Return Appointment in 1 week. Wound #7 Left,Plantar Foot: Return Appointment in 1 week. Wound #8 Left,Dorsal Foot: Return Appointment in 1 week. Wound #9 Left Toe - Web between 1st and 2nd: Return Appointment in 1 week. Off-Loading: Wound #1 Right Toe - Web between 4th and 5th: Other: - Keep pressure off feet Wound #3 Left Toe - Web between 4th and 5th: Other: - Keep pressure off feet Wound #4 Left Toe - Web between 3rd and 4th: Other: - Keep pressure off feet Wound #5 Right Toe - Web between 3rd and 4th: Other: - Keep pressure off feet Wound #6 Left Toe - Web between 2nd and 3rd: Other: - Keep pressure off feet Wound #7 Left,Plantar Foot: Other: - Keep pressure off feet Wound #8 Left,Dorsal Foot: Other: - Keep pressure off feet Wound #9 Left Toe - Web between 1st and 2nd: Other: - Keep pressure off feet Medications-please add to medication list.: Wound #1 Right Toe - Web between 4th and 5th: P.O. Antibiotics Wound #3 Left Toe - Web between 4th  and 5th: P.O. Antibiotics Wound #4 Left Toe - Web between 3rd and 4th: P.O. Antibiotics Wound #5 Right Toe - Web between 3rd and 4th: P.O. Antibiotics Wound #6 Left Toe - Web between 2nd and 3rd: P.O. Antibiotics Wound #7 Left,Plantar Foot: P.O. Antibiotics Wound #8 Left,Dorsal Foot: Poynor, Maclin A. ([id number]) P.O. Antibiotics Wound #9 Left Toe - Web between 1st and 2nd: P.O. Antibiotics Consults ordered were: Podiatry - [facility] The following medication(s) was prescribed: doxycycline monohydrate oral 100 mg capsule 1 capsule oral bid for 7days for cellulitis starting 11/13/2017 Cipro oral 500 mg tablet 1 tablet oral bid for an additional 5 days starting 11/13/2017 #1 I continue to think that this is severe tinea pedis that was complicated by cellulitis. When I treated him empirically when he first came in to the clinic he was essentially healed by mid January however things have continued to deteriorate in the left foot since he went back to work. I wasn't expecting him to move [place] over the weekend and this may have resulted in some deterioration nevertheless he doesn't seem  to be responding to the same treatment I gave him last time. 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 The areas too wet for topical therapy he is on terbinafine #3, to send him over to see podiatry to see if they have any better ideas on how to help this man. I've cautioned him to stay off this area. #4 if there is an alternative diagnosis here I'm not sure what it is #5 plain x-ray was negative #6 does not appear to be a vascular issue. 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 ([id number]) -------------------------------------------------------------------------------- SuperBill Details Patient Name: [name], Eulon A. Date of Service:  11/13/2017 Medical Record Number: [id number] Patient Account Number: [id number] Date of Birth/Sex: 08-06-1971 (47 y.o. Male) Treating RN: Cornell Barman Primary Care Provider: PATIENT, NO Other Clinician: Referring Provider: Harvest Dark Treating Provider/Extender: Ricard Dillon Weeks in Treatment: 10 Diagnosis Coding ICD-10 Codes Code Description [phone] Non-pressure chronic ulcer of other part of left foot with fat layer exposed L03.116 Cellulitis of left lower limb B35.3 Tinea pedis Facility Procedures CPT4 Code: [id number] Description: 11216 - WOUND CARE VISIT-LEV 5 EST PT Modifier: Quantity: 1 Physician Procedures CPT4 Code: [id number] Description: 72257 - WC PHYS LEVEL 2 - EST PT ICD-10 Diagnosis Description L97.522 Non-pressure chronic ulcer of other part of left foot with fa L03.116 Cellulitis of left lower limb B35.3 Tinea pedis Modifier: t layer exposed Quantity: 1 Electronic Signature(s) Signed: 11/13/2017 5:07:14 PM By: Linton Ham MD Entered By: Linton Ham on 11/13/2017 15:16:58

## 2017-11-16 NOTE — Progress Notes (Signed)
[name], [name] ([id number]) Visit Report for 11/13/2017 Arrival Information Details Patient Name: [name], [name] Date of Service: 11/13/2017 2:15 PM Medical Record Number: [id number] Patient Account Number: [id number] Date of Birth/Sex: 10-02-1970 (47 y.o. Male) Treating RN: Phillis Haggis Primary Care Keane Martelli: PATIENT, NO Other Clinician: Referring Ameris Akamine: Minna Antis Treating Garyson Stelly/Extender: Altamese [place] in Treatment: 10 Visit Information History Since Last Visit All ordered tests and consults were completed: No Patient Arrived: Ambulatory Added or deleted any medications: No Arrival Time: 14:31 Any new allergies or adverse reactions: No Accompanied By: wife Had a fall or experienced change in No Transfer Assistance: None activities of daily living that may affect Patient Identification Verified: Yes risk of falls: Secondary Verification Process Completed: Yes Signs or symptoms of abuse/neglect since last visito No Patient Requires Transmission-Based No Hospitalized since last visit: No Precautions: Pain Present Now: Yes Patient Has Alerts: No Electronic Signature(s) Signed: 11/13/2017 4:30:35 PM By: Alejandro Mulling Entered By: Alejandro Mulling on 11/13/2017 14:31:41 [name], [name] ([id number]) -------------------------------------------------------------------------------- Clinic Level of Care Assessment Details Patient Name: [name], [name] Date of Service: 11/13/2017 2:15 PM Medical Record Number: [id number] Patient Account Number: [id number] Date of Birth/Sex: 12-Nov-1970 (47 y.o. Male) Treating RN: Huel Coventry Primary Care Carmine Carrozza: PATIENT, NO Other Clinician: Referring Trixy Loyola: Minna Antis Treating Stevens Magwood/Extender: Altamese [place] in Treatment: 10 Clinic Level of Care Assessment Items TOOL 4 Quantity Score []  - Use when only an EandM is performed on FOLLOW-UP visit 0 ASSESSMENTS - Nursing Assessment / Reassessment []  -  Reassessment of Co-morbidities (includes updates in patient status) 0 X- 1 5 Reassessment of Adherence to Treatment Plan ASSESSMENTS - Wound and Skin Assessment / Reassessment []  - Simple Wound Assessment / Reassessment - one wound 0 X- 1 5 Complex Wound Assessment / Reassessment - multiple wounds []  - 0 Dermatologic / Skin Assessment (not related to wound area) ASSESSMENTS - Focused Assessment []  - Circumferential Edema Measurements - multi extremities 0 []  - 0 Nutritional Assessment / Counseling / Intervention []  - 0 Lower Extremity Assessment (monofilament, tuning fork, pulses) []  - 0 Peripheral Arterial Disease Assessment (using hand held doppler) ASSESSMENTS - Ostomy and/or Continence Assessment and Care []  - Incontinence Assessment and Management 0 []  - 0 Ostomy Care Assessment and Management (repouching, etc.) PROCESS - Coordination of Care []  - Simple Patient / Family Education for ongoing care 0 X- 1 20 Complex (extensive) Patient / Family Education for ongoing care X- 1 10 Staff obtains Chiropractor, Records, Test Results / Process Orders []  - 0 Staff telephones HHA, Nursing Homes / Clarify orders / etc []  - 0 Routine Transfer to another Facility (non-emergent condition) []  - 0 Routine Hospital Admission (non-emergent condition) []  - 0 New Admissions / Manufacturing engineer / Ordering NPWT, Apligraf, etc. []  - 0 Emergency Hospital Admission (emergent condition) X- 1 10 Simple Discharge Coordination [name], [name] ([id number]) X- 1 15 Complex (extensive) Discharge Coordination PROCESS - Special Needs []  - Pediatric / Minor Patient Management 0 []  - 0 Isolation Patient Management []  - 0 Hearing / Language / Visual special needs []  - 0 Assessment of Community assistance (transportation, D/C planning, etc.) []  - 0 Additional assistance / Altered mentation []  - 0 Support Surface(s) Assessment (bed, cushion, seat, etc.) INTERVENTIONS - Wound Cleansing /  Measurement []  - Simple Wound Cleansing - one wound 0 X- 9 5 Complex Wound Cleansing - multiple wounds X- 1 5 Wound Imaging (photographs - any number of wounds) []  - 0 Wound Tracing (instead of  photographs) []  - 0 Simple Wound Measurement - one wound X- 9 5 Complex Wound Measurement - multiple wounds INTERVENTIONS - Wound Dressings []  - Small Wound Dressing one or multiple wounds 0 []  - 0 Medium Wound Dressing one or multiple wounds X- 2 20 Large Wound Dressing one or multiple wounds []  - 0 Application of Medications - topical []  - 0 Application of Medications - injection INTERVENTIONS - Miscellaneous []  - External ear exam 0 []  - 0 Specimen Collection (cultures, biopsies, blood, body fluids, etc.) []  - 0 Specimen(s) / Culture(s) sent or taken to Lab for analysis []  - 0 Patient Transfer (multiple staff / Nurse, adultHoyer Lift / Similar devices) []  - 0 Simple Staple / Suture removal (25 or less) []  - 0 Complex Staple / Suture removal (26 or more) []  - 0 Hypo / Hyperglycemic Management (close monitor of Blood Glucose) []  - 0 Ankle / Brachial Index (ABI) - do not check if billed separately X- 1 5 Vital Signs [name], [name] ([id number]) Has the patient been seen at the hospital within the last three years: Yes Total Score: 205 Level Of Care: New/Established - Level 5 Electronic Signature(s) Signed: 11/13/2017 4:57:19 PM By: Elliot GurneyWoody, BSN, RN, CWS, Kim RN, BSN Entered By: Elliot GurneyWoody, BSN, RN, CWS, Kim on 11/13/2017 14:58:43 [name], [name] ([id number]) -------------------------------------------------------------------------------- Encounter Discharge Information Details Patient Name: Marc DragonARR, [name] Date of Service: 11/13/2017 2:15 PM Medical Record Number: [id number] Patient Account Number: [id number] Date of Birth/Sex: 09/18/1970 [age] y.o. Male) Treating RN: Curtis Sitesorthy, Joanna Primary Care Judah Carchi: PATIENT, NO Other Clinician: Referring Benford Asch: Minna AntisPADUCHOWSKI, KEVIN Treating Niclas Markell/Extender:  Altamese CarolinaOBSON, MICHAEL G Weeks in Treatment: 10 Encounter Discharge Information Items Discharge Pain Level: 0 Discharge Condition: Stable Ambulatory Status: Ambulatory Discharge Destination: Home Transportation: Private Auto Accompanied By: girlfriend Schedule Follow-up Appointment: Yes Medication Reconciliation completed and No provided to Patient/Care Imanol Bihl: Provided on Clinical Summary of Care: 11/13/2017 Form Type Recipient Paper Patient DC Electronic Signature(s) Signed: 11/15/2017 10:09:42 AM By: Gwenlyn PerkingMoore, Shelia Entered By: Gwenlyn PerkingMoore, Shelia on 11/13/2017 15:18:32 [name], [name] ([id number]) -------------------------------------------------------------------------------- General Visit Notes Details Patient Name: Marc DragonARR, Jamell A. Date of Service: 11/13/2017 2:15 PM Medical Record Number: [id number] Patient Account Number: [id number] Date of Birth/Sex: 10/03/1970 [age] y.o. Male) Treating RN: Curtis Sitesorthy, Joanna Primary Care Pilar Westergaard: PATIENT, NO Other Clinician: Referring Raychelle Hudman: Minna AntisPADUCHOWSKI, KEVIN Treating Bernardina Cacho/Extender: Maxwell CaulOBSON, MICHAEL G Weeks in Treatment: 10 Notes patient states that he cannot get supplies. I advised him to call his insurance about this and in the meantime, i gave him some kerlix and a box of simpurity silver alginate from our donated supplies Electronic Signature(s) Signed: 11/13/2017 4:38:39 PM By: Curtis Sitesorthy, Joanna Entered By: Curtis Sitesorthy, Joanna on 11/13/2017 15:18:55 [name], [name] ([id number]) -------------------------------------------------------------------------------- Lower Extremity Assessment Details Patient Name: Marc DragonARR, Jashawn A. Date of Service: 11/13/2017 2:15 PM Medical Record Number: [id number] Patient Account Number: [id number] Date of Birth/Sex: 01/30/1971 [age] y.o. Male) Treating RN: Phillis HaggisPinkerton, Debi Primary Care Donnia Poplaski: PATIENT, NO Other Clinician: Referring Medhansh Brinkmeier: Minna AntisPADUCHOWSKI, KEVIN Treating Kadan Millstein/Extender: Maxwell CaulOBSON, MICHAEL G Weeks in Treatment:  10 Vascular Assessment Pulses: Dorsalis Pedis Palpable: [Left:Yes] [Right:Yes] Posterior Tibial Extremity colors, hair growth, and conditions: Extremity Color: [Left:Normal] [Right:Normal] Hair Growth on Extremity: [Left:Yes] [Right:Yes] Temperature of Extremity: [Left:Cool] [Right:Cool] Capillary Refill: [Left:< 3 seconds] [Right:< 3 seconds] Toe Nail Assessment Left: Right: Thick: No No Discolored: Yes Yes Deformed: Yes Yes Improper Length and Hygiene: No No Electronic Signature(s) Signed: 11/13/2017 4:30:35 PM By: Alejandro MullingPinkerton, Debra Entered By: Alejandro MullingPinkerton, Debra on 11/13/2017 14:47:42 [name], [name] ([id number]) -------------------------------------------------------------------------------- Multi Wound  Chart Details Patient Name: [name], [name] Date of Service: 11/13/2017 2:15 PM Medical Record Number: [id number] Patient Account Number: [id number] Date of Birth/Sex: Jun 26, 1971 (47 y.o. Male) Treating RN: Huel Coventry Primary Care Jeimy Bickert: PATIENT, NO Other Clinician: Referring Marilouise Densmore: Minna Antis Treating Elcie Pelster/Extender: Maxwell Caul Weeks in Treatment: 10 Vital Signs Height(in): 71 Pulse(bpm): 67 Weight(lbs): 180 Blood Pressure(mmHg): 120/75 Body Mass Index(BMI): 25 Temperature(F): 97.7 Respiratory Rate 18 (breaths/min): Photos: [1:No Photos] [3:No Photos] [4:No Photos] Wound Location: [1:Right Toe - Web between 4th and 5th] [3:Left Toe - Web between 4th and 5th] [4:Left Toe - Web between 3rd and 4th] Wounding Event: [1:Gradually Appeared] [3:Pressure Injury] [4:Gradually Appeared] Primary Etiology: [1:Fungal] [3:Fungal] [4:Fungal] Date Acquired: [1:08/05/2017] [3:10/13/2017] [4:10/16/2017] Weeks of Treatment: [1:10] [3:4] [4:3] Wound Status: [1:Open] [3:Open] [4:Open] Pending Amputation on [1:Yes] [3:No] [4:No] Presentation: Measurements [name] x W x D [1:1.5x3x0.1] [3:4x2x0.1] [4:3.5x2x0.1] (cm) Area (cm) : [1:3.534] [3:6.283] [4:5.498] Volume (cm) :  [1:0.353] [3:0.628] [4:0.55] % Reduction in Area: [1:-38.90%] [3:-90.50%] [4:-16.70%] % Reduction in Volume: [1:-39.00%] [3:-90.30%] [4:-16.80%] Classification: [1:Partial Thickness] [3:Full Thickness Without Exposed Support Structures] [4:Partial Thickness] Exudate Amount: [1:Large] [3:Large] [4:Large] Exudate Type: [1:Serous] [3:Serosanguineous] [4:Serosanguineous] Exudate Color: [1:amber] [3:red, brown] [4:red, brown] Foul Odor After Cleansing: [1:No] [3:No] [4:No] Odor Anticipated Due to [1:N/A] [3:N/A] [4:N/A] Product Use: Wound Margin: [1:Flat and Intact] [3:Flat and Intact] [4:Distinct, outline attached] Granulation Amount: [1:Large (67-100%)] [3:Large (67-100%)] [4:Large (67-100%)] Granulation Quality: [1:Red] [3:Red] [4:Red] Necrotic Amount: [1:Small (1-33%)] [3:Small (1-33%)] [4:Small (1-33%)] Exposed Structures: [1:Fascia: No Fat Layer (Subcutaneous Tissue) Exposed: No Tendon: No Muscle: No Joint: No Bone: No] [3:Fat Layer (Subcutaneous Tissue) Exposed: Yes Fascia: No Tendon: No Muscle: No Joint: No Bone: No] [4:Fascia: No Fat Layer (Subcutaneous Tissue)  Exposed: No Tendon: No Muscle: No Joint: No Bone: No] Epithelialization: [1:None] [3:None] [4:None] Periwound Skin Texture: [name], [name] ([id number]) Excoriation: Yes Excoriation: Yes Excoriation: Yes Induration: Yes Induration: No Induration: No Callus: No Callus: No Callus: No Crepitus: No Crepitus: No Crepitus: No Rash: No Rash: No Rash: No Scarring: No Scarring: No Scarring: No Periwound Skin Moisture: Maceration: Yes Maceration: Yes Maceration: Yes Dry/Scaly: No Dry/Scaly: No Dry/Scaly: No Periwound Skin Color: Atrophie Blanche: No Atrophie Blanche: No Atrophie Blanche: No Cyanosis: No Cyanosis: No Cyanosis: No Ecchymosis: No Ecchymosis: No Ecchymosis: No Erythema: No Erythema: No Erythema: No Hemosiderin Staining: No Hemosiderin Staining: No Hemosiderin Staining: No Mottled: No Mottled:  No Mottled: No Pallor: No Pallor: No Pallor: No Rubor: No Rubor: No Rubor: No Temperature: No Abnormality No Abnormality No Abnormality Tenderness on Palpation: Yes Yes Yes Wound Preparation: Ulcer Cleansing: Ulcer Cleansing: Ulcer Cleansing: Rinsed/Irrigated with Saline Rinsed/Irrigated with Saline Rinsed/Irrigated with Saline Topical Anesthetic Applied: Topical Anesthetic Applied: Topical Anesthetic Applied: None None None Wound Number: [id number]  Photos: No Photos No Photos No Photos Wound Location: Right Toe - Web between 3rd Left Toe - Web between 2nd Left Foot - Plantar and 4th and 3rd Wounding Event: Gradually Appeared Gradually Appeared Gradually Appeared Primary Etiology: Fungal Fungal Fungal Date Acquired: 10/16/2017 10/23/2017 10/23/2017 Weeks of Treatment: 3 1 1  Wound Status: Open Open Open Pending Amputation on No No No Presentation: Measurements [name] x W x D 3x2x0.1 4x2.5x0.1 1.5x6.5x0.1 (cm) Area (cm) : 4.712 7.854 7.658 Volume (cm) : 0.471 0.785 0.766 % Reduction in Area: -300.00% -60.00% -35.40% % Reduction in Volume: -299.20% -59.90% -35.60% Classification: Partial Thickness Full Thickness Without Full Thickness Without Exposed Support Structures Exposed Support Structures Exudate Amount: Large Large Large Exudate Type: Serosanguineous Serous Serous Exudate  Color: red, brown amber amber Foul Odor After Cleansing: No Yes Yes Odor Anticipated Due to N/A No No Product Use: Wound Margin: Distinct, outline attached Distinct, outline attached Distinct, outline attached Granulation Amount: Large (67-100%) Large (67-100%) Large (67-100%) Granulation Quality: Red Red Red Necrotic Amount: Small (1-33%) Small (1-33%) Small (1-33%) Exposed Structures: Fascia: No Fascia: No Fascia: No Fat Layer (Subcutaneous Fat Layer (Subcutaneous Fat Layer (Subcutaneous Tissue) Exposed: No Tissue) Exposed: No Tissue) Exposed: No Tendon: No Tendon: No Tendon: No Muscle:  No Muscle: No Muscle: No Lac, Adan A. ([id number]) Joint: No Joint: No Joint: No Bone: No Bone: No Bone: No Epithelialization: None None None Periwound Skin Texture: Excoriation: Yes No Abnormalities Noted No Abnormalities Noted Induration: No Callus: No Crepitus: No Rash: No Scarring: No Periwound Skin Moisture: Maceration: Yes Maceration: Yes Maceration: Yes Dry/Scaly: No Periwound Skin Color: Atrophie Blanche: No No Abnormalities Noted No Abnormalities Noted Cyanosis: No Ecchymosis: No Erythema: No Hemosiderin Staining: No Mottled: No Pallor: No Rubor: No Temperature: No Abnormality No Abnormality No Abnormality Tenderness on Palpation: Yes Yes Yes Wound Preparation: Ulcer Cleansing: Ulcer Cleansing: Ulcer Cleansing: Rinsed/Irrigated with Saline Rinsed/Irrigated with Saline Rinsed/Irrigated with Saline Topical Anesthetic Applied: Topical Anesthetic Applied: Topical Anesthetic Applied: None Other: lidocaine 4% None Wound Number: [id number] N/A Photos: No Photos No Photos N/A Wound Location: Left Foot - Dorsal Left Toe - Web between 1st N/A and 2nd Wounding Event: Gradually Appeared Gradually Appeared N/A Primary Etiology: Fungal Fungal N/A Date Acquired: 10/23/2017 10/23/2017 N/A Weeks of Treatment: 1 1 N/A Wound Status: Open Open N/A Pending Amputation on No No N/A Presentation: Measurements [name] x W x D 5x2x0.1 4.5x1.2x0.1 N/A (cm) Area (cm) : 7.854 4.241 N/A Volume (cm) : 0.785 0.424 N/A % Reduction in Area: -3907.10% -260.00% N/A % Reduction in Volume: -3825.00% -259.30% N/A Classification: Full Thickness Without Full Thickness Without N/A Exposed Support Structures Exposed Support Structures Exudate Amount: Large Large N/A Exudate Type: Serous Serous N/A Exudate Color: amber amber N/A Foul Odor After Cleansing: Yes Yes N/A Odor Anticipated Due to No No N/A Product Use: Wound Margin: Distinct, outline attached Distinct, outline attached N/A Granulation  Amount: Large (67-100%) Large (67-100%) N/A Granulation Quality: Red Red N/A Necrotic Amount: None Present (0%) Small (1-33%) N/A Exposed Structures: N/A Liguori, Yanis A. ([id number]) Fascia: No Fascia: No Fat Layer (Subcutaneous Fat Layer (Subcutaneous Tissue) Exposed: No Tissue) Exposed: No Tendon: No Tendon: No Muscle: No Muscle: No Joint: No Joint: No Bone: No Bone: No Epithelialization: None None N/A Periwound Skin Texture: No Abnormalities Noted No Abnormalities Noted N/A Periwound Skin Moisture: Maceration: Yes Maceration: Yes N/A Periwound Skin Color: No Abnormalities Noted No Abnormalities Noted N/A Temperature: No Abnormality No Abnormality N/A Tenderness on Palpation: Yes Yes N/A Wound Preparation: Ulcer Cleansing: Topical Anesthetic Applied: N/A Rinsed/Irrigated with Saline None Topical Anesthetic Applied: None Treatment Notes Electronic Signature(s) Signed: 11/13/2017 5:07:14 PM By: Baltazar Najjar MD Entered By: Baltazar Najjar on 11/13/2017 15:08:43 Previti, Felix A. ([id number]) -------------------------------------------------------------------------------- Multi-Disciplinary Care Plan Details Patient Name: [name], Jahir A. Date of Service: 11/13/2017 2:15 PM Medical Record Number: [id number] Patient Account Number: [id number] Date of Birth/Sex: [birth year] (47 y.o. Male) Treating RN: Huel Coventry Primary Care Kieren Ricci: PATIENT, NO Other Clinician: Referring Areona Homer: Minna Antis Treating Amadea Keagy/Extender: Altamese [place] in Treatment: 10 Active Inactive ` Abuse / Safety / Falls / Self Care Management Nursing Diagnoses: Potential for falls Goals: Patient will not experience any injury related to falls Date Initiated: 09/04/2017 Target Resolution Date: 12/21/2017 Goal Status:  Active Interventions: Assess impairment of mobility on admission and as needed per policy Assess personal safety and home safety (as indicated) on admission and as  needed Assess self care needs on admission and as needed Provide education on basic hygiene Provide education on fall prevention Treatment Activities: Education provided on Basic Hygiene : 09/04/2017 Notes: ` Nutrition Nursing Diagnoses: Imbalanced nutrition Potential for alteratiion in Nutrition/Potential for imbalanced nutrition Goals: Patient/caregiver agrees to and verbalizes understanding of need to use nutritional supplements and/or vitamins as prescribed Date Initiated: 09/04/2017 Target Resolution Date: 12/21/2017 Goal Status: Active Interventions: Assess patient nutrition upon admission and as needed per policy Notes: ` Orientation to the Wound Care Program [place], Hernando A. ([id number]) Nursing Diagnoses: Knowledge deficit related to the wound healing center program Goals: Patient/caregiver will verbalize understanding of the Wound Healing Center Program Date Initiated: 09/04/2017 Target Resolution Date: 09/21/2017 Goal Status: Active Interventions: Provide education on orientation to the wound center Notes: ` Pain, Acute or Chronic Nursing Diagnoses: Pain, acute or chronic: actual or potential Potential alteration in comfort, pain Goals: Patient/caregiver will verbalize adequate pain control between visits Date Initiated: 09/04/2017 Target Resolution Date: 11/23/2017 Goal Status: Active Interventions: Complete pain assessment as per visit requirements Notes: ` Soft Tissue Infection Nursing Diagnoses: Impaired tissue integrity Knowledge deficit related to disease process and management Knowledge deficit related to home infection control: handwashing, handling of soiled dressings, supply storage Potential for infection: soft tissue Goals: Patient/caregiver will verbalize understanding of or measures to prevent infection and contamination in the home setting Date Initiated: 09/04/2017 Target Resolution Date: 11/23/2017 Goal Status: Active Signs and symptoms of  infection will be recognized early to allow for prompt treatment Date Initiated: 09/04/2017 Target Resolution Date: 12/21/2017 Goal Status: Active Interventions: Assess signs and symptoms of infection every visit Provide education on infection Treatment Activities: Education provided on Infection : 09/04/2017 [place]er, Greysen A. ([id number]) Notes: ` Wound/Skin Impairment Nursing Diagnoses: Impaired tissue integrity Knowledge deficit related to smoking impact on wound healing Knowledge deficit related to ulceration/compromised skin integrity Goals: Ulcer/skin breakdown will have a volume reduction of 80% by week 12 Date Initiated: 09/04/2017 Target Resolution Date: 12/21/2017 Goal Status: Active Interventions: Assess patient/caregiver ability to perform ulcer/skin care regimen upon admission and as needed Assess ulceration(s) every visit Provide education on smoking Provide education on ulcer and skin care Notes: Electronic Signature(s) Signed: 11/13/2017 4:57:19 PM By: Elliot Gurney, BSN, RN, CWS, Kim RN, BSN Entered By: Elliot Gurney, BSN, RN, CWS, Kim on 11/13/2017 14:53:18 Hanel, Damiel A. ([id number]) -------------------------------------------------------------------------------- Pain Assessment Details Patient Name: [name], Cobin A. Date of Service: 11/13/2017 2:15 PM Medical Record Number: [id number] Patient Account Number: [id number] Date of Birth/Sex: 10-02-1970 (47 y.o. Male) Treating RN: Phillis Haggis Primary Care Maud Rubendall: PATIENT, NO Other Clinician: Referring Delani Kohli: Minna Antis Treating Myson Levi/Extender: Maxwell Caul Weeks in Treatment: 10 Active Problems Location of Pain Severity and Description of Pain Patient Has Paino Yes Site Locations Pain Location: Pain in Ulcers Duration of the Pain. Constant / Intermittento Intermittent Rate the pain. Current Pain Level: 7 Character of Pain Describe the Pain: Burning Pain Management and Medication Current Pain  Management: Electronic Signature(s) Signed: 11/13/2017 4:30:35 PM By: Alejandro Mulling Entered By: Alejandro Mulling on 11/13/2017 14:32:05 Hollis, Ram A. ([id number]) -------------------------------------------------------------------------------- Patient/Caregiver Education Details Patient Name: [name], Devontay A. Date of Service: 11/13/2017 2:15 PM Medical Record Number: [id number] Patient Account Number: [id number] Date of Birth/Gender: Jan 21, 1971 (47 y.o. Male) Treating RN: Curtis Sites Primary Care Physician: PATIENT, NO Other Clinician: Referring Physician: Minna Antis Treating Physician/Extender:  ROBSON, MICHAEL G Weeks in Treatment: 10 Education Assessment Education Provided To: Patient Education Topics Provided Wound/Skin Impairment: Handouts: Other: wound care as ordered Methods: Demonstration, Explain/Verbal Responses: State content correctly Electronic Signature(s) Signed: 11/13/2017 4:38:39 PM By: Curtis Sites Entered By: Curtis Sites on 11/13/2017 15:17:56 Hinz, Alcus A. ([id number]) -------------------------------------------------------------------------------- Wound Assessment Details Patient Name: [name], Kai A. Date of Service: 11/13/2017 2:15 PM Medical Record Number: [id number] Patient Account Number: [id number] Date of Birth/Sex: July 12, 1971 (47 y.o. Male) Treating RN: Phillis Haggis Primary Care Mayme Profeta: PATIENT, NO Other Clinician: Referring Braxen Dobek: Minna Antis Treating Aretta Stetzel/Extender: Maxwell Caul Weeks in Treatment: 10 Wound Status Wound Number: 1 Primary Etiology: Fungal Wound Location: Right Toe - Web between 4th and 5th Wound Status: Open Wounding Event: Gradually Appeared Date Acquired: 08/05/2017 Weeks Of Treatment: 10 Clustered Wound: No Pending Amputation On Presentation Photos Photo Uploaded By: Alejandro Mulling on 11/13/2017 16:19:48 Wound Measurements Length: (cm) 1.5 Width: (cm) 3 Depth: (cm) 0.1 Area: (cm)  3.534 Volume: (cm) 0.353 % Reduction in Area: -38.9% % Reduction in Volume: -39% Epithelialization: None Tunneling: No Undermining: No Wound Description Classification: Partial Thickness Wound Margin: Flat and Intact Exudate Amount: Large Exudate Type: Serous Exudate Color: amber Foul Odor After Cleansing: No Slough/Fibrino No Wound Bed Granulation Amount: Large (67-100%) Exposed Structure Granulation Quality: Red Fascia Exposed: No Necrotic Amount: Small (1-33%) Fat Layer (Subcutaneous Tissue) Exposed: No Necrotic Quality: Adherent Slough Tendon Exposed: No Muscle Exposed: No Joint Exposed: No Bone Exposed: No Periwound Skin Texture [place], Adekunle A. ([id number]) Texture Color No Abnormalities Noted: No No Abnormalities Noted: No Callus: No Atrophie Blanche: No Crepitus: No Cyanosis: No Excoriation: Yes Ecchymosis: No Induration: Yes Erythema: No Rash: No Hemosiderin Staining: No Scarring: No Mottled: No Pallor: No Moisture Rubor: No No Abnormalities Noted: No Dry / Scaly: No Temperature / Pain Maceration: Yes Temperature: No Abnormality Tenderness on Palpation: Yes Wound Preparation Ulcer Cleansing: Rinsed/Irrigated with Saline Topical Anesthetic Applied: None Treatment Notes Wound #1 (Right Toe - Web between 4th and 5th) 1. Cleansed with: Clean wound with Normal Saline 2. Anesthetic Topical Lidocaine 4% cream to wound bed prior to debridement 4. Dressing Applied: Other dressing (specify in notes) 5. Secondary Dressing Applied ABD Pad Dry Gauze Kerlix/Conform 7. Secured with Tape Notes silvercel Electronic Signature(s) Signed: 11/13/2017 4:30:35 PM By: Alejandro Mulling Entered By: Alejandro Mulling on 11/13/2017 14:39:49 Peeler, Ryota A. ([id number]) -------------------------------------------------------------------------------- Wound Assessment Details Patient Name: [name], Emry A. Date of Service: 11/13/2017 2:15 PM Medical Record Number:  [id number] Patient Account Number: [id number] Date of Birth/Sex: Jan 24, 1971 (47 y.o. 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 Wound Status: Open Wounding Event: Pressure Injury Date Acquired: 10/13/2017 Weeks Of Treatment: 4 Clustered Wound: No Photos Photo Uploaded By: Alejandro Mulling on 11/13/2017 16:19:49 Wound Measurements Length: (cm) 4 Width: (cm) 2 Depth: (cm) 0.1 Area: (cm) 6.283 Volume: (cm) 0.628 % Reduction in Area: -90.5% % Reduction in Volume: -90.3% Epithelialization: None Tunneling: No Undermining: No Wound Description Full Thickness Without Exposed Support Classification: Structures Wound Margin: Flat and Intact Exudate Large Amount: Exudate Type: Serosanguineous Exudate Color: red, brown Foul Odor After Cleansing: No Slough/Fibrino Yes Wound Bed Granulation Amount: Large (67-100%) Exposed Structure Granulation Quality: Red Fascia Exposed: No Necrotic Amount: Small (1-33%) Fat Layer (Subcutaneous Tissue) Exposed: Yes Necrotic Quality: Adherent Slough Tendon Exposed:  No Muscle Exposed: No Joint Exposed: No Bone Exposed: No Jakob, Ashon A. ([id number]) Periwound Skin Texture Texture Color No Abnormalities Noted: No No Abnormalities Noted: No Callus: No Atrophie Blanche: No Crepitus: No Cyanosis: No Excoriation: Yes Ecchymosis: No Induration: No Erythema: No Rash: No Hemosiderin Staining: No Scarring: No Mottled: No Pallor: No Moisture Rubor: No No Abnormalities Noted: No Dry / Scaly: No Temperature / Pain Maceration: Yes Temperature: No Abnormality Tenderness on Palpation: Yes Wound Preparation Ulcer Cleansing: Rinsed/Irrigated with Saline Topical Anesthetic Applied:  None Treatment Notes Wound #3 (Left Toe - Web between 4th and 5th) 1. Cleansed with: Clean wound with Normal Saline 2. Anesthetic Topical Lidocaine 4% cream to wound bed prior to debridement 4. Dressing Applied: Other dressing (specify in notes) 5. Secondary Dressing Applied ABD Pad Dry Gauze Kerlix/Conform 7. Secured with Tape Notes silvercel Electronic Signature(s) Signed: 11/13/2017 4:30:35 PM By: Alejandro Mulling Entered By: Alejandro Mulling on 11/13/2017 14:41:15 [place]gate, Tayshun A. ([id number]) -------------------------------------------------------------------------------- Wound Assessment Details Patient Name: [name], Jye A. Date of Service: 11/13/2017 2:15 PM Medical Record Number: [id number] Patient Account Number: [id number] Date of Birth/Sex: April 24, 1971 (47 y.o. 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 between 3rd and 4th Wound Status: Open Wounding Event: Gradually Appeared Date Acquired: 10/16/2017 Weeks Of Treatment: 3 Clustered Wound: No Photos Photo Uploaded By: Alejandro Mulling on 11/13/2017 16:22:48 Wound Measurements Length: (cm) 3.5 Width: (cm) 2 Depth: (cm) 0.1 Area: (cm) 5.498 Volume: (cm) 0.55 % Reduction in Area: -16.7% % Reduction in Volume: -16.8% Epithelialization: None Tunneling: No Undermining: No Wound Description Classification: Partial Thickness Wound Margin: Distinct, outline attached Exudate Amount: Large Exudate Type: Serosanguineous Exudate Color: red, brown Foul Odor After Cleansing: No Slough/Fibrino Yes Wound Bed Granulation Amount: Large (67-100%) Exposed Structure Granulation Quality: Red Fascia Exposed: No Necrotic Amount: Small (1-33%) Fat Layer (Subcutaneous Tissue) Exposed:  No Necrotic Quality: Adherent Slough Tendon Exposed: No Muscle Exposed: No Joint Exposed: No Bone Exposed: No Periwound Skin Texture Cregg, Cliff A. ([id number]) Texture Color No Abnormalities Noted: No No Abnormalities Noted: No Callus: No Atrophie Blanche: No Crepitus: No Cyanosis: No Excoriation: Yes Ecchymosis: No Induration: No Erythema: No Rash: No Hemosiderin Staining: No Scarring: No Mottled: No Pallor: No Moisture Rubor: No No Abnormalities Noted: No Dry / Scaly: No Temperature / Pain Maceration: Yes Temperature: No Abnormality Tenderness on Palpation: Yes Wound Preparation Ulcer Cleansing: Rinsed/Irrigated with Saline Topical Anesthetic Applied: None Treatment Notes Wound #4 (Left Toe - Web between 3rd and 4th) 1. Cleansed with: Clean wound with Normal Saline 2. Anesthetic Topical Lidocaine 4% cream to wound bed prior to debridement 4. Dressing Applied: Other dressing (specify in notes) 5. Secondary Dressing Applied ABD Pad Dry Gauze Kerlix/Conform 7. Secured with Tape Notes silvercel Electronic Signature(s) Signed: 11/13/2017 4:30:35 PM By: Alejandro Mulling Entered By: Alejandro Mulling on 11/13/2017 14:42:30 Hinkle, Aedyn A. ([id number]) -------------------------------------------------------------------------------- Wound Assessment Details Patient Name: [name], Ladarrious A. Date of Service: 11/13/2017 2:15 PM Medical Record Number: [id number] Patient Account Number: [id number] Date of Birth/Sex: 25-Aug-1971 (47 y.o. 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 between 3rd and 4th Wound Status: Open Wounding Event: Gradually Appeared Date Acquired: 10/16/2017  Weeks Of Treatment: 3 Clustered Wound: No Photos Photo  Uploaded By: Alejandro Mulling on 11/13/2017 16:22:49 Wound Measurements Length: (cm) 3 Width: (cm) 2 Depth: (cm) 0.1 Area: (cm) 4.712 Volume: (cm) 0.471 % Reduction in Area: -300% % Reduction in Volume: -299.2% Epithelialization: None Tunneling: No Undermining: No Wound Description Classification: Partial Thickness Wound Margin: Distinct, outline attached Exudate Amount: Large Exudate Type: Serosanguineous Exudate Color: red, brown Foul Odor After Cleansing: No Slough/Fibrino Yes Wound Bed Granulation Amount: Large (67-100%) Exposed Structure Granulation Quality: Red Fascia Exposed: No Necrotic Amount: Small (1-33%) Fat Layer (Subcutaneous Tissue) Exposed: No Necrotic Quality: Adherent Slough Tendon Exposed: No Muscle Exposed: No Joint Exposed: No Bone Exposed: No Periwound Skin Texture Schindler, Randol A. ([id number]) Texture Color No Abnormalities Noted: No No Abnormalities Noted: No Callus: No Atrophie Blanche: No Crepitus: No Cyanosis: No Excoriation: Yes Ecchymosis: No Induration: No Erythema: No Rash: No Hemosiderin Staining: No Scarring: No Mottled: No Pallor: No Moisture Rubor: No No Abnormalities Noted: No Dry / Scaly: No Temperature / Pain Maceration: Yes Temperature: No Abnormality Tenderness on Palpation: Yes Wound Preparation Ulcer Cleansing: Rinsed/Irrigated with Saline Topical Anesthetic Applied: None Treatment Notes Wound #5 (Right Toe - Web between 3rd and 4th) 1. Cleansed with: Clean wound with Normal Saline 2. Anesthetic Topical Lidocaine 4% cream to wound bed prior to debridement 4. Dressing Applied: Other dressing (specify in notes) 5. Secondary Dressing Applied ABD Pad Dry Gauze Kerlix/Conform 7. Secured with Tape Notes silvercel Electronic Signature(s) Signed: 11/13/2017 4:30:35 PM By: Alejandro Mulling Entered By: Alejandro Mulling on 11/13/2017 14:43:10 Hirschi, Christepher A.  ([id number]) -------------------------------------------------------------------------------- Wound Assessment Details Patient Name: [name], Khalik A. Date of Service: 11/13/2017 2:15 PM Medical Record Number: [id number] Patient Account Number: [id number] Date of Birth/Sex: 30-Jan-1971 (47 y.o. Male) Treating RN: Phillis Haggis Primary Care Harsh Trulock: PATIENT, NO Other Clinician: Referring Langdon Crosson: Minna Antis Treating Burgundy Matuszak/Extender: Maxwell Caul Weeks in Treatment: 10 Wound Status Wound Number: 6 Primary Etiology: Fungal Wound Location: Left Toe - Web between 2nd and 3rd Wound Status: Open Wounding Event: Gradually Appeared Date Acquired: 10/23/2017 Weeks Of Treatment: 1 Clustered Wound: No Photos Photo Uploaded By: Alejandro Mulling on 11/13/2017 16:23:38 Wound Measurements Length: (cm) 4 Width: (cm) 2.5 Depth: (cm) 0.1 Area: (cm) 7.854 Volume: (cm) 0.785 % Reduction in Area: -60% % Reduction in Volume: -59.9% Epithelialization: None Tunneling: No Undermining: No Wound Description Full Thickness Without Exposed Support Classification: Structures Wound Margin: Distinct, outline attached Exudate Large Amount: Exudate Type: Serous Exudate Color: amber Foul Odor After Cleansing: Yes Due to Product Use: No Slough/Fibrino Yes Wound Bed Granulation Amount: Large (67-100%) Exposed Structure Granulation Quality: Red Fascia Exposed: No Necrotic Amount: Small (1-33%) Fat Layer (Subcutaneous Tissue) Exposed: No Necrotic Quality: Adherent Slough Tendon Exposed: No Muscle Exposed: No Joint Exposed: No Bone Exposed: No Quashie, Graden A. ([id number]) Periwound Skin Texture Texture Color No Abnormalities Noted: No No Abnormalities Noted: No Moisture Temperature / Pain No Abnormalities Noted: No Temperature: No Abnormality Maceration: Yes Tenderness on Palpation: Yes Wound Preparation Ulcer Cleansing: Rinsed/Irrigated with Saline Topical Anesthetic  Applied: Other: lidocaine 4%, Treatment Notes Wound #6 (Left Toe - Web between 2nd and 3rd) 1. Cleansed with: Clean wound with Normal Saline 2. Anesthetic Topical Lidocaine 4% cream to wound bed prior to debridement 4. Dressing Applied: Other dressing (specify in notes) 5. Secondary Dressing Applied ABD Pad Dry Gauze Kerlix/Conform 7. Secured with Tape Notes silvercel Electronic Signature(s) Signed: 11/13/2017 4:30:35 PM By: Alejandro Mulling Entered By: Alejandro Mulling on 11/13/2017 14:45:00 Longest, Charly A. (  [id number]) -------------------------------------------------------------------------------- Wound Assessment Details Patient Name: DINI, Dekota A. Date of Service: 11/13/2017 2:15 PM Medical Record Number: [id number] Patient Account Number: [id number] Date of Birth/Sex: [birth year] (47 y.o. 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 Wound Status: Open Wounding Event: Gradually Appeared Date Acquired: 10/23/2017 Weeks Of Treatment: 1 Clustered Wound: No Photos Photo Uploaded By: Alejandro Mulling on 11/13/2017 16:23:38 Wound Measurements Length: (cm) 1.5 Width: (cm) 6.5 Depth: (cm) 0.1 Area: (cm) 7.658 Volume: (cm) 0.766 % Reduction in Area: -35.4% % Reduction in Volume: -35.6% Epithelialization: None Tunneling: No Undermining: No Wound Description Full Thickness Without Exposed Support Classification: Structures Wound Margin: Distinct, outline attached Exudate Large Amount: Exudate Type: Serous Exudate Color: amber Foul Odor After Cleansing: Yes Due to Product Use: No Slough/Fibrino Yes Wound Bed Granulation Amount: Large (67-100%) Exposed Structure Granulation Quality: Red Fascia Exposed: No Necrotic Amount:  Small (1-33%) Fat Layer (Subcutaneous Tissue) Exposed: No Necrotic Quality: Adherent Slough Tendon Exposed: No Muscle Exposed: No Joint Exposed: No Bone Exposed: No Hagger, Leobardo A. ([id number]) Periwound Skin Texture Texture Color No Abnormalities Noted: No No Abnormalities Noted: No Moisture Temperature / Pain No Abnormalities Noted: No Temperature: No Abnormality Maceration: Yes Tenderness on Palpation: Yes Wound Preparation Ulcer Cleansing: Rinsed/Irrigated with Saline Topical Anesthetic Applied: None Treatment Notes Wound #7 (Left, Plantar Foot) 1. Cleansed with: Clean wound with Normal Saline 2. Anesthetic Topical Lidocaine 4% cream to wound bed prior to debridement 4. Dressing Applied: Other dressing (specify in notes) 5. Secondary Dressing Applied ABD Pad Dry Gauze Kerlix/Conform 7. Secured with Tape Notes silvercel Electronic Signature(s) Signed: 11/13/2017 4:30:35 PM By: Alejandro Mulling Entered By: Alejandro Mulling on 11/13/2017 14:45:39 Hakeem, Randolf A. ([id number]) -------------------------------------------------------------------------------- Wound Assessment Details Patient Name: [name], Jontez A. Date of Service: 11/13/2017 2:15 PM Medical Record Number: [id number] Patient Account Number: [id number] Date of Birth/Sex: 10-24-1970 (47 y.o. Male) Treating RN: Phillis Haggis Primary Care Dason Mosley: PATIENT, NO Other Clinician: Referring Verla Bryngelson: Minna Antis Treating Darlene Brozowski/Extender: Maxwell Caul Weeks in Treatment: 10 Wound Status Wound Number: 8 Primary Etiology: Fungal Wound Location: Left Foot - Dorsal Wound Status: Open Wounding Event: Gradually Appeared Date Acquired: 10/23/2017 Weeks Of Treatment: 1 Clustered Wound: No Photos Photo Uploaded By: Alejandro Mulling on 11/13/2017 16:24:08 Wound Measurements Length: (cm) 5 Width: (cm) 2 Depth: (cm) 0.1 Area: (cm) 7.854 Volume: (cm) 0.785 % Reduction in Area: -3907.1% % Reduction in  Volume: -3825% Epithelialization: None Wound Description Full Thickness Without Exposed Support Classification: Structures Wound Margin: Distinct, outline attached Exudate Large Amount: Exudate Type: Serous Exudate Color: amber Foul Odor After Cleansing: Yes Due to Product Use: No Slough/Fibrino Yes Wound Bed Granulation Amount: Large (67-100%) Exposed Structure Granulation Quality: Red Fascia Exposed: No Necrotic Amount: None Present (0%) Fat Layer (Subcutaneous Tissue) Exposed: No Tendon Exposed: No Muscle Exposed: No Joint Exposed: No Bone Exposed: No Ramdass, Ival A. ([id number]) Periwound Skin Texture Texture Color No Abnormalities Noted: No No Abnormalities Noted: No Moisture Temperature / Pain No Abnormalities Noted: No Temperature: No Abnormality Maceration: Yes Tenderness on Palpation: Yes Wound Preparation Ulcer Cleansing: Rinsed/Irrigated with Saline Topical Anesthetic Applied: None Treatment Notes Wound #8 (Left, Dorsal Foot) 1. Cleansed with: Clean wound with Normal Saline 2. Anesthetic Topical Lidocaine 4% cream to wound bed prior to debridement 4. Dressing Applied: Other dressing (specify in notes) 5. Secondary Dressing Applied ABD  Pad Dry Gauze Kerlix/Conform 7. Secured with Tape Notes silvercel Electronic Signature(s) Signed: 11/13/2017 4:30:35 PM By: Alejandro Mulling Entered By: Alejandro Mulling on 11/13/2017 14:46:04 Vilar, Ashtan A. ([id number]) -------------------------------------------------------------------------------- Wound Assessment Details Patient Name: [name], Trask A. Date of Service: 11/13/2017 2:15 PM Medical Record Number: [id number] Patient Account Number: [id number] Date of Birth/Sex: 16-May-1971 (47 y.o. Male) Treating RN: Phillis Haggis Primary Care Tasmine Hipwell: PATIENT, NO Other Clinician: Referring Avah Bashor: Minna Antis Treating Arnola Crittendon/Extender: Maxwell Caul Weeks in Treatment: 10 Wound Status Wound Number:  9 Primary Etiology: Fungal Wound Location: Left Toe - Web between 1st and 2nd Wound Status: Open Wounding Event: Gradually Appeared Date Acquired: 10/23/2017 Weeks Of Treatment: 1 Clustered Wound: No Photos Photo Uploaded By: Alejandro Mulling on 11/13/2017 16:24:08 Wound Measurements Length: (cm) 4.5 Width: (cm) 1.2 Depth: (cm) 0.1 Area: (cm) 4.241 Volume: (cm) 0.424 % Reduction in Area: -260% % Reduction in Volume: -259.3% Epithelialization: None Tunneling: No Undermining: No Wound Description Full Thickness Without Exposed Support Classification: Structures Wound Margin: Distinct, outline attached Exudate Large Amount: Exudate Type: Serous Exudate Color: amber Foul Odor After Cleansing: Yes Due to Product Use: No Slough/Fibrino Yes Wound Bed Granulation Amount: Large (67-100%) Exposed Structure Granulation Quality: Red Fascia Exposed: No Necrotic Amount: Small (1-33%) Fat Layer (Subcutaneous Tissue) Exposed: No Necrotic Quality: Adherent Slough Tendon Exposed: No Muscle Exposed: No Joint Exposed: No Bone Exposed: No Benscoter, Jishnu A. ([id number]) Periwound Skin Texture Texture Color No Abnormalities Noted: No No Abnormalities Noted: No Moisture Temperature / Pain No Abnormalities Noted: No Temperature: No Abnormality Maceration: Yes Tenderness on Palpation: Yes Wound Preparation Topical Anesthetic Applied: None Treatment Notes Wound #9 (Left Toe - Web between 1st and 2nd) 1. Cleansed with: Clean wound with Normal Saline 2. Anesthetic Topical Lidocaine 4% cream to wound bed prior to debridement 4. Dressing Applied: Other dressing (specify in notes) 5. Secondary Dressing Applied ABD Pad Dry Gauze Kerlix/Conform 7. Secured with Tape Notes silvercel Electronic Signature(s) Signed: 11/13/2017 4:30:35 PM By: Alejandro Mulling Entered By: Alejandro Mulling on 11/13/2017 14:46:32 Carby, Serafino A.  ([id number]) -------------------------------------------------------------------------------- Vitals Details Patient Name: [name], Kaivon A. Date of Service: 11/13/2017 2:15 PM Medical Record Number: [id number] Patient Account Number: [id number] Date of Birth/Sex: May 22, 1971 (47 y.o. Male) Treating RN: Phillis Haggis Primary Care Seleny Allbright: PATIENT, NO Other Clinician: Referring Darrian Goodwill: Minna Antis Treating Allyah Heather/Extender: Maxwell Caul Weeks in Treatment: 10 Vital Signs Time Taken: 14:32 Temperature (F): 97.7 Height (in): 71 Pulse (bpm): 67 Weight (lbs): 180 Respiratory Rate (breaths/min): 18 Body Mass Index (BMI): 25.1 Blood Pressure (mmHg): 120/75 Reference Range: 80 - 120 mg / dl Electronic Signature(s) Signed: 11/13/2017 4:30:35 PM By: Alejandro Mulling Entered By: Alejandro Mulling on 11/13/2017 14:32:33

## 2017-11-20 ENCOUNTER — Encounter: Payer: Non-veteran care | Attending: Internal Medicine | Admitting: Internal Medicine

## 2017-11-20 DIAGNOSIS — E785 Hyperlipidemia, unspecified: Secondary | ICD-10-CM | POA: Insufficient documentation

## 2017-11-20 DIAGNOSIS — B353 Tinea pedis: Secondary | ICD-10-CM | POA: Diagnosis not present

## 2017-11-20 DIAGNOSIS — L97522 Non-pressure chronic ulcer of other part of left foot with fat layer exposed: Secondary | ICD-10-CM | POA: Diagnosis present

## 2017-11-20 DIAGNOSIS — I739 Peripheral vascular disease, unspecified: Secondary | ICD-10-CM | POA: Diagnosis not present

## 2017-11-20 DIAGNOSIS — F17200 Nicotine dependence, unspecified, uncomplicated: Secondary | ICD-10-CM | POA: Diagnosis not present

## 2017-11-21 NOTE — Progress Notes (Signed)
Marc, [name] ([id number]) Visit Report for 11/20/2017 HPI Details Patient Name: [name], [name] Date of Service: 11/20/2017 2:45 PM Medical Record Number: [id number] Patient Account Number: [id number] Date of Birth/Sex: [birth year] (47 y.o. Male) Treating RN: Cornell Barman Primary Care Provider: PATIENT, NO Other Clinician: Referring Provider: Harvest Dark Treating Provider/Extender: Tito Dine in Treatment: 11 History of Present Illness HPI Description: 09/04/17; this is a 47 year old man who was not a diabetic and has no known history of peripheral vascular disease. He does have hyperlipidemia and a history of cocaine abuse although he insists that this is not active. He tells me that about a month ago he started having denuded skin between the toes of his left foot and on the plantar aspect of his left foot. He was picking at the skin and pulling it off. More recently he has had involvement of the toes on the right foot. Week or 2 ago he started getting swelling and pain in the left foot and he was seen in the ER on 08/29/17 he was diagnosed with cellulitis. The only ulcer that they commented on was the left fifth toe. An x-ray suggested ulcer at the fifth MTP slightly osteopenic appearance of the fifth met head question early osteo. He has 14 days of Bactrim DS prescribed and the patient states the swelling and pain in the left foot is improved since starting this 6 days ago. ABIs in this clinic were 1.28 on the right and 1.3 on the left. The patient is a smoker at one half pack per day 09/11/17; patient came last week and in general I think he had severe tinea pedis with secondary bacterial cellulitis. He was on Septra DS and is completed this. I gave him oral terbinafine for the tinea pedis. A culture I did on the left foot last week grew Pseudomonas which is quinolone sensitive. I'll give him 10 days of ciprofloxacin today. However he arrives with his foot looking a lot  better. There is less erythema and less warmth 09/18/17; patient has completed this systemic antifungal therapy and antibiotics for secondary bacterial cellulitis. He is using Lotrimin between his toes and silver alginate. All of this looks a lot better 10/02/17;everything is cleaned up in the patient's bilateral feet except for the right fourth fifth toe web space. The patient had severe tinea pedis with secondary cellulitis. Multiple denuded areas between his toes. We have treated him successfully for all of this however he still has 1 remaining area 10/16/17; patient arrives after a 2 week hiatus he is back at work. Using Lotrimin cream and silver alginate between his toes. He tells me that in the last 2 days he's had denuded areas of skin that he is pulled off. There is no evidence of secondary infection or cellulitis. Not really a lot of evidence of tinea pedis between the toes either. 10/23/17; the patient still has extensive skin breakdown between the bilateral fourth fifth, third fourth and even into the second third toe on the right is been using silver alginate. There is no evidence of secondary infection or cellulitis however this is a lot worse than a week ago 11/06/17; the patient arrives today with his left greater than right foot in not a very good condition. He has denuded skin from basically the base of the second to the fifth toe on the left with skin loss on all his toe webspaces in that distribution. More concerning once again he has erythema in the forefoot which I think  is secondary cellulitis there is an odor and a greenish discoloration probably pseudomonas again. oOn the right foot he has loss of skin in the webspace between the fifth and fourth and the fourth and third toes. Although they state there is an odor here I didn't recognize it. oHe also brings up than when he first went to the ER just before he came here they did an x-ray that suggested the possibility of early  osteomyelitis in the fifth toe on the left. He will need x-rays of this foot again. I will x-ray the right foot as well. He is not a diabetic and does not have PVD oHe is complaining of pain on the lateral part of his foot at the heel but he thinks this might be from walking on the side of his foot to avoid the discomfort of the left forefoot. He has a history of recurrent tinea pedis as teenager when he was in sports. The areas initially responded to oral antibiotics and oral antifungals. Culture of this I did showed pseudomonas last time and the greenish discoloration certainly makes that a possibility. I gave him oral ciprofloxacin for 10 days 500 twice a day and terbinafine 250 a day for 2 weeks. oHe has been using topical ketoconazole for quite a period of time now and silver alginate which also should have antifungal properties. 11/13/17; the patient is not doing well. The area on the right foot looks better however the area on the left does not. He is lost to needed skin on the plantar surface of the foot with loss of skin between all of his toes. There is weeping edema fluid. He complains of itching and burning. I gave him terbinafine last week along with ciprofloxacin to cover the Pseudomonas he [name], [name] ([id number]) cultured when he first came to this clinic however cultures showed pseudomonas and MRSA. X-ray of the foot did not show any osseous abnormalities. This weekend he moved and was up and down stairs in the foot which I'm sure didn't do this any any good however this is not doing well. When he first came to the clinic he cleaned up and about a month ago I sent him back to work he really appeared in the clinic 2 weeks later with his foot a lot worse. I had been using topical ketoconazole along with silver alginate which should be antifungal as well as drying however this is not work on this occasion not at least on the left foot. 11/20/17; no improvement. He continues to  complain of itching and burning pain. The skin on the plantar aspect of his left foot is denuding off with a slightly green discoloration. Bacterial cultures I have done of this area show Pseudomonas. I had him on ciprofloxacin and doxycycline as well as oral terbinafine. Once again he is off work not able to function really because of this. Unfortunately he missed his podiatry appointment because of insurance issues.I continue to think he has severe ulcerative tinea pedis predominantly involving the right foot but also the left foot in thethird fourth and fifth toe web spaces Electronic Signature(s) Signed: 11/20/2017 5:10:26 PM By: Linton Ham MD Entered By: Linton Ham on 11/20/2017 16:21:56 Ogle, Denvil A. ([id number]) -------------------------------------------------------------------------------- Physical Exam Details Patient Name: [name], [name] Date of Service: 11/20/2017 2:45 PM Medical Record Number: [id number] Patient Account Number: [id number] Date of Birth/Sex: 1971-08-28 (47 y.o. Male) Treating RN: Cornell Barman Primary Care Provider: PATIENT, NO Other Clinician: Referring Provider: Harvest Dark Treating Provider/Extender:  [name], [name] Weeks in Treatment: 11 Constitutional Sitting or standing Blood Pressure is within target range for patient.. Pulse regular and within target range for patient.Marland Kitchen Respirations regular, non-labored and within target range.. Temperature is normal and within the target range for the patient.Marland Kitchen appears in no distress. Cardiovascular Pedal pulses palpable and strong bilaterally.. Lymphatic none palpable in the popliteal or inguinal area. Psychiatric Patient appears depressed today.. Notes wound exam still an extensive area of full-thickness skin loss in the left forefoot between the toes and extending into the plantar aspect of the foot. I'm concerned about the continued erythema dorsally spreading from the toes. On the right side between  the fourth fifth third fourth and on the plantar aspect of the base of the toes or similar changes. Electronic Signature(s) Signed: 11/20/2017 5:10:26 PM By: Linton Ham MD Entered By: Linton Ham on 11/20/2017 16:23:30 Dykema, Areg A. ([id number]) -------------------------------------------------------------------------------- Physician Orders Details Patient Name: [name], [name] Date of Service: 11/20/2017 2:45 PM Medical Record Number: [id number] Patient Account Number: [id number] Date of Birth/Sex: [birth year] (47 y.o. Male) Treating RN: Cornell Barman Primary Care Provider: PATIENT, NO Other Clinician: Referring Provider: Harvest Dark Treating Provider/Extender: Tito Dine in Treatment: 11 Verbal / Phone Orders: No Diagnosis Coding Wound Cleansing Wound #1 Right Toe - Web between 4th and 5th o Cleanse wound with mild soap and water o May Shower, gently pat wound dry prior to applying new dressing. Wound #3 Left Toe - Web between 4th and 5th o Cleanse wound with mild soap and water o May Shower, gently pat wound dry prior to applying new dressing. Wound #4 Left Toe - Web between 3rd and 4th o Cleanse wound with mild soap and water o May Shower, gently pat wound dry prior to applying new dressing. Wound #5 Right Toe - Web between 3rd and 4th o Cleanse wound with mild soap and water o May Shower, gently pat wound dry prior to applying new dressing. Wound #6 Left Toe - Web between 2nd and 3rd o Cleanse wound with mild soap and water o May Shower, gently pat wound dry prior to applying new dressing. Wound #7 Left,Plantar Foot o Cleanse wound with mild soap and water o May Shower, gently pat wound dry prior to applying new dressing. Wound #8 Left,Dorsal Foot o Cleanse wound with mild soap and water o May Shower, gently pat wound dry prior to applying new dressing. Wound #9 Left Toe - Web between 1st and 2nd o Cleanse wound with mild  soap and water o May Shower, gently pat wound dry prior to applying new dressing. Primary Wound Dressing Wound #1 Right Toe - Web between 4th and 5th o [place] Non-Adherent Wound #3 Left Toe - Web between 4th and 5th o [place] Non-Adherent Wound #4 Left Toe - Web between 3rd and 4th o [place] Non-Adherent Wound #5 Right Toe - Web between 3rd and 4th o [place] Non-Adherent Craze, Emannuel A. ([id number]) Wound #6 Left Toe - Web between 2nd and 3rd o [place] Non-Adherent Wound #7 Left,Plantar Foot o [place] Non-Adherent Wound #8 Left,Dorsal Foot o [place] Non-Adherent Wound #9 Left Toe - Web between 1st and 2nd o [place] Non-Adherent Secondary Dressing Wound #1 Right Toe - Web between 4th and 5th o ABD and Kerlix/Conform Wound #3 Left Toe - Web between 4th and 5th o ABD and Kerlix/Conform Wound #4 Left Toe - Web between 3rd and 4th o ABD and Kerlix/Conform Wound #5 Right Toe - Web between 3rd and 4th o  ABD and Kerlix/Conform Wound #6 Left Toe - Web between 2nd and 3rd o ABD and Kerlix/Conform Wound #7 Left,Plantar Foot o ABD and Kerlix/Conform Wound #8 Left,Dorsal Foot o ABD and Kerlix/Conform Wound #9 Left Toe - Web between 1st and 2nd o ABD and Kerlix/Conform Dressing Change Frequency Wound #1 Right Toe - Web between 4th and 5th o Change dressing every day. Wound #3 Left Toe - Web between 4th and 5th o Change dressing every day. Wound #4 Left Toe - Web between 3rd and 4th o Change dressing every day. Wound #5 Right Toe - Web between 3rd and 4th o Change dressing every day. Wound #6 Left Toe - Web between 2nd and 3rd o Change dressing every day. Wound #7 Left,Plantar Foot o Change dressing every day. [name], [name] ([id number]) Wound #8 Left,Dorsal Foot o Change dressing every day. Wound #9 Left Toe - Web between 1st and 2nd o Change dressing every day. Follow-up Appointments Wound #1 Right Toe - Web  between 4th and 5th o Return Appointment in 1 week. Wound #3 Left Toe - Web between 4th and 5th o Return Appointment in 1 week. Wound #4 Left Toe - Web between 3rd and 4th o Return Appointment in 1 week. Wound #5 Right Toe - Web between 3rd and 4th o Return Appointment in 1 week. Wound #6 Left Toe - Web between 2nd and 3rd o Return Appointment in 1 week. Wound #7 Left,Plantar Foot o Return Appointment in 1 week. Wound #8 Left,Dorsal Foot o Return Appointment in 1 week. Wound #9 Left Toe - Web between 1st and 2nd o Return Appointment in 1 week. Off-Loading Wound #1 Right Toe - Web between 4th and 5th o Other: - Keep pressure off of feet. Wound #3 Left Toe - Web between 4th and 5th o Other: - Keep pressure off of feet. Wound #4 Left Toe - Web between 3rd and 4th o Other: - Keep pressure off of feet. Wound #5 Right Toe - Web between 3rd and 4th o Other: - Keep pressure off of feet. Wound #6 Left Toe - Web between 2nd and 3rd o Other: - Keep pressure off of feet. Wound #7 Left,Plantar Foot o Other: - Keep pressure off of feet. Wound #8 Left,Dorsal Foot o Other: - Keep pressure off of feet. Wound #9 Left Toe - Web between 1st and 2nd o Other: - Keep pressure off of feet. [name], [name] ([id number]) Patient Medications Allergies: NKDA Notifications Medication Indication Start End Athlete's Foot (clotrimazole) ulcerative tinea 11/20/2017 pedis DOSE topical 1 % cream - cream topical for ulcerative tinea pedis bid to affected area fluconazole ulcerative tinea 11/20/2017 pedis DOSE oral 150 mg tablet - tablet oral 2 tabs once per week (364m) cefdinir 11/20/2017 DOSE oral 300 mg capsule - 1 capsule oral bid for 7 days (d/c cipro} Electronic Signature(s) Signed: 11/20/2017 4:38:24 PM By: RLinton HamMD Previous Signature: 11/20/2017 4:32:16 PM Version By: RLinton HamMD Previous Signature: 11/20/2017 4:24:03 PM Version By: WGretta Cool BSN, RN, CWS, Kim RN,  BSN Entered By: RLinton Hamon 11/20/2017 16:38:23 Venuto, Sammie A. ([id number] -------------------------------------------------------------------------------- Problem List Details Patient Name: CRobina [name] Silver A. Date of Service: 11/20/2017 2:45 PM Medical Record Number: [id number] Account Number: [id number] of Birth/Sex: [birth year]((47y.o. Male) Treating RN: WCornell BarmanPrimary Care Provider: PATIENT, NO Other Clinician: Referring Provider: PHarvest DarkTreating Provider/Extender: RTito Dinein Treatment: 11 Active Problems ICD-10 Encounter Code Description Active Date Diagnosis L97.522 Non-pressure chronic ulcer of  other part of left foot with fat layer 09/04/2017 Yes exposed L03.116 Cellulitis of left lower limb 09/04/2017 Yes B35.3 Tinea pedis 09/04/2017 Yes Inactive Problems Resolved Problems Electronic Signature(s) Signed: 11/20/2017 5:10:26 PM By: Linton Ham MD Entered By: Linton Ham on 11/20/2017 16:03:24 [name], Stallings. ([id number]) -------------------------------------------------------------------------------- Progress Note Details Patient Name: [name], [name] Date of Service: 11/20/2017 2:45 PM Medical Record Number: [id number] Patient Account Number: [id number] Date of Birth/Sex: 1970/11/16 (47 y.o. Male) Treating RN: Cornell Barman Primary Care Provider: PATIENT, NO Other Clinician: Referring Provider: Harvest Dark Treating Provider/Extender: Ricard Dillon Weeks in Treatment: 11 Subjective History of Present Illness (HPI) 09/04/17; this is a 47 year old man who was not a diabetic and has no known history of peripheral vascular disease. He does have hyperlipidemia and a history of cocaine abuse although he insists that this is not active. He tells me that about a month ago he started having denuded skin between the toes of his left foot and on the plantar aspect of his left foot. He was picking at the skin and pulling it off.  More recently he has had involvement of the toes on the right foot. Week or 2 ago he started getting swelling and pain in the left foot and he was seen in the ER on 08/29/17 he was diagnosed with cellulitis. The only ulcer that they commented on was the left fifth toe. An x-ray suggested ulcer at the fifth MTP slightly osteopenic appearance of the fifth met head question early osteo. He has 14 days of Bactrim DS prescribed and the patient states the swelling and pain in the left foot is improved since starting this 6 days ago. ABIs in this clinic were 1.28 on the right and 1.3 on the left. The patient is a smoker at one half pack per day 09/11/17; patient came last week and in general I think he had severe tinea pedis with secondary bacterial cellulitis. He was on Septra DS and is completed this. I gave him oral terbinafine for the tinea pedis. A culture I did on the left foot last week grew Pseudomonas which is quinolone sensitive. I'll give him 10 days of ciprofloxacin today. However he arrives with his foot looking a lot better. There is less erythema and less warmth 09/18/17; patient has completed this systemic antifungal therapy and antibiotics for secondary bacterial cellulitis. He is using Lotrimin between his toes and silver alginate. All of this looks a lot better 10/02/17;everything is cleaned up in the patient's bilateral feet except for the right fourth fifth toe web space. The patient had severe tinea pedis with secondary cellulitis. Multiple denuded areas between his toes. We have treated him successfully for all of this however he still has 1 remaining area 10/16/17; patient arrives after a 2 week hiatus he is back at work. Using Lotrimin cream and silver alginate between his toes. He tells me that in the last 2 days he's had denuded areas of skin that he is pulled off. There is no evidence of secondary infection or cellulitis. Not really a lot of evidence of tinea pedis between the toes  either. 10/23/17; the patient still has extensive skin breakdown between the bilateral fourth fifth, third fourth and even into the second third toe on the right is been using silver alginate. There is no evidence of secondary infection or cellulitis however this is a lot worse than a week ago 11/06/17; the patient arrives today with his left greater than right foot in not a very  good condition. He has denuded skin from basically the base of the second to the fifth toe on the left with skin loss on all his toe webspaces in that distribution. More concerning once again he has erythema in the forefoot which I think is secondary cellulitis there is an odor and a greenish discoloration probably pseudomonas again. On the right foot he has loss of skin in the webspace between the fifth and fourth and the fourth and third toes. Although they state there is an odor here I didn't recognize it. He also brings up than when he first went to the ER just before he came here they did an x-ray that suggested the possibility of early osteomyelitis in the fifth toe on the left. He will need x-rays of this foot again. I will x-ray the right foot as well. He is not a diabetic and does not have PVD He is complaining of pain on the lateral part of his foot at the heel but he thinks this might be from walking on the side of his foot to avoid the discomfort of the left forefoot. He has a history of recurrent tinea pedis as teenager when he was in sports. The areas initially responded to oral antibiotics and oral antifungals. Culture of this I did showed pseudomonas last time and the greenish discoloration certainly makes that a possibility. I gave him oral ciprofloxacin for 10 days 500 twice a day and terbinafine 250 a day for 2 weeks. He has been using topical ketoconazole for quite a period of time now and silver alginate which also should have antifungal properties. 11/13/17; the patient is not doing well. The area on  the right foot looks better however the area on the left does not. He is lost to needed skin on the plantar surface of the foot with loss of skin between all of his toes. There is weeping edema fluid. He complains of itching and burning. I gave him terbinafine last week along with ciprofloxacin to cover the Pseudomonas he cultured when he first came to this clinic however cultures showed pseudomonas and MRSA. X-ray of the foot did not show any osseous abnormalities. This weekend he moved and was up and down stairs in the foot which I'm sure didn't do this any [name], [name] ([id number]) any good however this is not doing well. When he first came to the clinic he cleaned up and about a month ago I sent him back to work he really appeared in the clinic 2 weeks later with his foot a lot worse. I had been using topical ketoconazole along with silver alginate which should be antifungal as well as drying however this is not work on this occasion not at least on the left foot. 11/20/17; no improvement. He continues to complain of itching and burning pain. The skin on the plantar aspect of his left foot is denuding off with a slightly green discoloration. Bacterial cultures I have done of this area show Pseudomonas. I had him on ciprofloxacin and doxycycline as well as oral terbinafine. Once again he is off work not able to function really because of this. Unfortunately he missed his podiatry appointment because of insurance issues.I continue to think he has severe ulcerative tinea pedis predominantly involving the right foot but also the left foot in thethird fourth and fifth toe web spaces Objective Constitutional Sitting or standing Blood Pressure is within target range for patient.. Pulse regular and within target range for patient.Marland Kitchen Respirations regular, non-labored and  within target range.. Temperature is normal and within the target range for the patient.Marland Kitchen appears in no distress. Vitals Time Taken:  2:44 PM, Height: 71 in, Weight: 180 lbs, BMI: 25.1, Temperature: 98.2 F, Pulse: 73 bpm, Respiratory Rate: 18 breaths/min, Blood Pressure: 109/75 mmHg. Cardiovascular Pedal pulses palpable and strong bilaterally.. Lymphatic none palpable in the popliteal or inguinal area. Psychiatric Patient appears depressed today.. General Notes: wound exam still an extensive area of full-thickness skin loss in the left forefoot between the toes and extending into the plantar aspect of the foot. I'm concerned about the continued erythema dorsally spreading from the toes. On the right side between the fourth fifth third fourth and on the plantar aspect of the base of the toes or similar changes. Integumentary (Hair, Skin) Wound #1 status is Open. Original cause of wound was Gradually Appeared. The wound is located on the Right Toe - Web between 4th and 5th. The wound measures 2.5cm length x 2.5cm width x 0.1cm depth; 4.909cm^2 area and 0.491cm^3 volume. There is no tunneling or undermining noted. There is a large amount of serous drainage noted. Foul odor after cleansing was noted. The wound margin is flat and intact. There is large (67-100%) red granulation within the wound bed. There is a small (1-33%) amount of necrotic tissue within the wound bed including Adherent Slough. The periwound skin appearance exhibited: Excoriation, Induration, Maceration. The periwound skin appearance did not exhibit: Callus, Crepitus, Rash, Scarring, Dry/Scaly, Atrophie Blanche, Cyanosis, Ecchymosis, Hemosiderin Staining, Mottled, Pallor, Rubor, Erythema. Periwound temperature was noted as No Abnormality. The periwound has tenderness on palpation. Wound #3 status is Open. Original cause of wound was Pressure Injury. The wound is located on the Left Toe - Web between 4th and 5th. The wound measures 4.5cm length x 2cm width x 0.1cm depth; 7.069cm^2 area and 0.707cm^3 volume. There is Fat Layer (Subcutaneous Tissue) Exposed  exposed. There is no tunneling or undermining noted. There is a large amount of serosanguineous drainage noted. Foul odor after cleansing was noted. The wound margin is flat and intact. There is large (67-100%) red granulation within the wound bed. There is a small (1-33%) amount of necrotic tissue within the wound bed [name], [name] ([id number]) including [place]. The periwound skin appearance exhibited: Excoriation, Maceration. The periwound skin appearance did not exhibit: Callus, Crepitus, Induration, Rash, Scarring, Dry/Scaly, Atrophie Blanche, Cyanosis, Ecchymosis, Hemosiderin Staining, Mottled, Pallor, Rubor, Erythema. Periwound temperature was noted as No Abnormality. The periwound has tenderness on palpation. Wound #4 status is Open. Original cause of wound was Gradually Appeared. The wound is located on the Left Toe - Web between 3rd and 4th. The wound measures 3.8cm length x 2cm width x 0.1cm depth; 5.969cm^2 area and 0.597cm^3 volume. There is no tunneling or undermining noted. There is a large amount of serosanguineous drainage noted. The wound margin is distinct with the outline attached to the wound base. There is large (67-100%) red granulation within the wound bed. There is a small (1-33%) amount of necrotic tissue within the wound bed including Adherent Slough. The periwound skin appearance exhibited: Excoriation, Maceration. The periwound skin appearance did not exhibit: Callus, Crepitus, Induration, Rash, Scarring, Dry/Scaly, Atrophie Blanche, Cyanosis, Ecchymosis, Hemosiderin Staining, Mottled, Pallor, Rubor, Erythema. Periwound temperature was noted as No Abnormality. The periwound has tenderness on palpation. Wound #5 status is Open. Original cause of wound was Gradually Appeared. The wound is located on the Right Toe - Web between 3rd and 4th. The wound measures 4cm length x 2cm width  x 0.1cm depth; 6.283cm^2 area and 0.628cm^3 volume. There is no tunneling noted.  There is a large amount of serosanguineous drainage noted. The wound margin is distinct with the outline attached to the wound base. There is large (67-100%) red granulation within the wound bed. There is a small (1-33%) amount of necrotic tissue within the wound bed including Adherent Slough. The periwound skin appearance exhibited: Excoriation, Maceration. The periwound skin appearance did not exhibit: Callus, Crepitus, Induration, Rash, Scarring, Dry/Scaly, Atrophie Blanche, Cyanosis, Ecchymosis, Hemosiderin Staining, Mottled, Pallor, Rubor, Erythema. Periwound temperature was noted as No Abnormality. The periwound has tenderness on palpation. Wound #6 status is Open. Original cause of wound was Gradually Appeared. The wound is located on the Left Toe - Web between 2nd and 3rd. The wound measures 5cm length x 2cm width x 0.1cm depth; 7.854cm^2 area and 0.785cm^3 volume. There is no tunneling or undermining noted. There is a large amount of serous drainage noted. Foul odor after cleansing was noted. The wound margin is distinct with the outline attached to the wound base. There is large (67-100%) red granulation within the wound bed. There is a small (1-33%) amount of necrotic tissue within the wound bed including Adherent Slough. The periwound skin appearance exhibited: Maceration. Periwound temperature was noted as No Abnormality. The periwound has tenderness on palpation. Wound #7 status is Open. Original cause of wound was Gradually Appeared. The wound is located on the Patrick. The wound measures 1cm length x 7.5cm width x 0.1cm depth; 5.89cm^2 area and 0.589cm^3 volume. There is no tunneling or undermining noted. There is a large amount of serous drainage noted. Foul odor after cleansing was noted. The wound margin is distinct with the outline attached to the wound base. There is large (67-100%) red granulation within the wound bed. There is a small (1-33%) amount of necrotic  tissue within the wound bed including Adherent Slough. The periwound skin appearance exhibited: Maceration. Periwound temperature was noted as No Abnormality. The periwound has tenderness on palpation. Wound #8 status is Open. Original cause of wound was Gradually Appeared. The wound is located on the Left,Dorsal Foot. The wound measures 2cm length x 4cm width x 0.1cm depth; 6.283cm^2 area and 0.628cm^3 volume. There is no tunneling or undermining noted. There is a large amount of serous drainage noted. Foul odor after cleansing was noted. The wound margin is distinct with the outline attached to the wound base. There is large (67-100%) red granulation within the wound bed. There is no necrotic tissue within the wound bed. The periwound skin appearance exhibited: Maceration. Periwound temperature was noted as No Abnormality. The periwound has tenderness on palpation. Wound #9 status is Open. Original cause of wound was Gradually Appeared. The wound is located on the Left Toe - Web between 1st and 2nd. The wound measures 5.3cm length x 3cm width x 0.1cm depth; 12.488cm^2 area and 1.249cm^3 volume. There is no tunneling or undermining noted. There is a large amount of serous drainage noted. Foul odor after cleansing was noted. The wound margin is distinct with the outline attached to the wound base. There is large (67-100%) red granulation within the wound bed. There is a small (1-33%) amount of necrotic tissue within the wound bed including Adherent Slough. The periwound skin appearance exhibited: Maceration. Periwound temperature was noted as No Abnormality. The periwound has tenderness on palpation. Assessment [name], [name] 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 Wound #3 Left Toe - Web between 4th and 5th: Cleanse wound with mild soap and water May Shower, gently pat wound dry prior to applying new dressing. Wound #4 Left Toe - Web between 3rd and 4th: Cleanse wound with mild soap and water May Shower, gently pat wound dry prior to applying new dressing. Wound #5 Right Toe - Web between 3rd and 4th: Cleanse wound with mild soap and water May Shower, gently pat wound dry prior to applying new dressing. Wound #6 Left Toe - Web between 2nd and 3rd: Cleanse wound with mild soap and water May Shower, gently pat wound dry prior to applying new dressing. Wound #7 Left,Plantar Foot: Cleanse wound with mild soap and water May Shower, gently pat wound dry prior to applying new dressing. Wound #8 Left,Dorsal Foot: Cleanse wound with mild soap and water May Shower, gently pat wound dry prior to applying new dressing. Wound #9 Left Toe - Web between 1st and 2nd: Cleanse wound with mild soap and water May Shower, gently pat wound dry prior to applying new dressing. Primary Wound Dressing: Wound #1 Right Toe - Web between 4th and 5th: [place] Non-Adherent Wound #3 Left Toe - Web between 4th and 5th: [place] Non-Adherent Wound #4 Left Toe - Web between 3rd and 4th: [place] Non-Adherent Wound #5 Right Toe - Web between 3rd and 4th: [place] Non-Adherent Wound #6 Left Toe - Web between 2nd and 3rd: [place] Non-Adherent Wound #7 Left,Plantar Foot: [place] Non-Adherent Wound #8 Left,Dorsal Foot: [place] Non-Adherent Wound #9 Left Toe - Web between 1st and 2nd: [place] Non-Adherent Secondary Dressing: Wound #1 Right Toe - Web between 4th and 5th: ABD and Kerlix/Conform Rossini, Estle A. ([id number]) Wound #3 Left Toe - Web between 4th and 5th: ABD and Kerlix/Conform Wound #4 Left Toe - Web between 3rd and 4th: ABD and Kerlix/Conform Wound #5 Right Toe -  Web between 3rd and 4th: ABD and Kerlix/Conform Wound #6 Left Toe - Web between 2nd and 3rd: ABD and Kerlix/Conform Wound #7 Left,Plantar Foot: ABD and Kerlix/Conform Wound #8 Left,Dorsal Foot: ABD and Kerlix/Conform Wound #9 Left Toe - Web between 1st and 2nd: ABD and Kerlix/Conform Dressing Change Frequency: Wound #1 Right Toe - Web between 4th and 5th: Change dressing every day. Wound #3 Left Toe - Web between 4th and 5th: Change dressing every day. Wound #4 Left Toe - Web between 3rd and 4th: Change dressing every day. Wound #5 Right Toe - Web between 3rd and 4th: Change dressing every day. Wound #6 Left Toe - Web between 2nd and 3rd: Change dressing every day. Wound #7 Left,Plantar Foot: Change dressing every day. Wound #8 Left,Dorsal Foot: Change dressing every day. Wound #9 Left Toe - Web between 1st and 2nd: Change dressing every day. Follow-up Appointments: Wound #1 Right Toe - Web between 4th and 5th: Return Appointment in 1 week. Wound #3 Left Toe - Web between 4th and 5th: Return Appointment in 1 week. Wound #4 Left Toe - Web between 3rd and 4th: Return Appointment in 1 week. Wound #5 Right Toe - Web between 3rd and 4th: Return Appointment in 1 week. Wound #6 Left Toe - Web between 2nd and 3rd: Return Appointment in 1 week. Wound #7 Left,Plantar Foot: Return Appointment in 1 week.  Wound #8 Left,Dorsal Foot: Return Appointment in 1 week. Wound #9 Left Toe - Web between 1st and 2nd: Return Appointment in 1 week. Off-Loading: Wound #1 Right Toe - Web between 4th and 5th: Other: - Keep pressure off of feet. Wound #3 Left Toe - Web between 4th and 5th: Other: - Keep pressure off of feet. Wound #4 Left Toe - Web between 3rd and 4th: Other: - Keep pressure off of feet. Wound #5 Right Toe - Web between 3rd and 4th: Other: - Keep pressure off of feet. Wound #6 Left Toe - Web between 2nd and 3rd: Other: - Keep pressure off of feet. Wound #7 Left,Plantar  Foot: Galiano, Rex A. ([id number]) Other: - Keep pressure off of feet. Wound #8 Left,Dorsal Foot: Other: - Keep pressure off of feet. Wound #9 Left Toe - Web between 1st and 2nd: Other: - Keep pressure off of feet. 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 With secondary cellulitis. The greenish discoloration on the bottom of the foot leads me to believe this is Pseudomonas. 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 I'm going to add cefdinir 300 twice a day for 7 days #2 topical terbinafine twice a day to the areas with silver alginate between the toes #3 oral terbinafine doesn't seem to have helped here except for the first 2 weeks I was seeing him along with topical ketoconazole. I'm going to give him Diflucan 300 mg once a week #4 I referred him to podiatry for help with this. I did get him a lot better in 2-3 weeks however he went back to work and has totally declined since then. Electronic Signature(s) Signed: 11/20/2017 5:10:26 PM By: RLinton HamMD Entered By: RLinton Hamon 11/20/2017 16:40:28 CHurley Gunnison A. ([id number] -------------------------------------------------------------------------------- SuperBill Details Patient Name: CRobina [name] Rucker A. Date of Service: 11/20/2017 Medical Record Number: [id number] Account Number: [id number] of Birth/Sex: 208/29/1972((47y.o. Male) Treating RN: WCornell BarmanPrimary Care Provider: PATIENT, NO Other Clinician: Referring Provider: PHarvest DarkTreating  Provider/Extender: RTito Dinein Treatment: 11 Diagnosis Coding ICD-10 Codes Code Description [id number]-pressure chronic ulcer of other part of left foot with fat layer exposed L03.116 Cellulitis of left lower limb B35.3 Tinea pedis Facility Procedures CPT4 Code: [id number]: [id number]- WOUND CARE VISIT-LEV 5 EST PT Modifier: Quantity: 1 Physician Procedures CPT4 Code: [id number]: [id number]- WC PHYS LEVEL 3 - EST PT ICD-10 Diagnosis Description L97.522 Non-pressure chronic ulcer of other part of left foot with fa L03.116 Cellulitis of left lower limb B35.3 Tinea pedis Modifier: t layer exposed Quantity: 1 Electronic Signature(s) Signed: 11/20/2017 5:10:26 PM By: RLinton HamMD Entered By: RLinton Hamon 11/20/2017 16:41:01

## 2017-11-22 NOTE — Progress Notes (Signed)
[name], [name] ([id number]) Visit Report for 11/20/2017 Arrival Information Details Patient Name: COMMONS, [name] Date of Service: 11/20/2017 2:45 PM Medical Record Number: [id number] Patient Account Number: [id number] Date of Birth/Sex: June 19, 1971 (47 y.o. Male) Treating RN: Renne Crigler Primary Care Sabrena Gavitt: PATIENT, NO Other Clinician: Referring Harm Jou: Minna Antis Treating Zeena Starkel/Extender: Altamese [place] in Treatment: 11 Visit Information History Since Last Visit All ordered tests and consults were completed: No Patient Arrived: Ambulatory Added or deleted any medications: No Arrival Time: 14:43 Any new allergies or adverse reactions: No Accompanied By: girlfriend Had a fall or experienced change in No Transfer Assistance: None activities of daily living that may affect Patient Identification Verified: Yes risk of falls: Secondary Verification Process Completed: Yes Signs or symptoms of abuse/neglect since last visito No Patient Requires Transmission-Based No Hospitalized since last visit: No Precautions: Has Dressing in Place as Prescribed: Yes Patient Has Alerts: No Pain Present Now: Yes Electronic Signature(s) Signed: 11/20/2017 4:15:18 PM By: Renne Crigler Entered By: Renne Crigler on 11/20/2017 14:43:57 [name], [name] ([id number]) -------------------------------------------------------------------------------- Clinic Level of Care Assessment Details Patient Name: [name], [name] Date of Service: 11/20/2017 2:45 PM Medical Record Number: [id number] Patient Account Number: [id number] Date of Birth/Sex: 1970-11-14 (46 y.o. Male) Treating RN: Huel Coventry Primary Care Armour Villanueva: PATIENT, NO Other Clinician: Referring Colby Reels: Minna Antis Treating Harshan Kearley/Extender: Altamese [place] in Treatment: 11 Clinic Level of Care Assessment Items TOOL 4 Quantity Score []  - Use when only an EandM is performed on FOLLOW-UP visit 0 ASSESSMENTS -  Nursing Assessment / Reassessment []  - Reassessment of Co-morbidities (includes updates in patient status) 0 X- 1 5 Reassessment of Adherence to Treatment Plan ASSESSMENTS - Wound and Skin Assessment / Reassessment []  - Simple Wound Assessment / Reassessment - one wound 0 X- 8 5 Complex Wound Assessment / Reassessment - multiple wounds []  - 0 Dermatologic / Skin Assessment (not related to wound area) ASSESSMENTS - Focused Assessment []  - Circumferential Edema Measurements - multi extremities 0 []  - 0 Nutritional Assessment / Counseling / Intervention []  - 0 Lower Extremity Assessment (monofilament, tuning fork, pulses) []  - 0 Peripheral Arterial Disease Assessment (using hand held doppler) ASSESSMENTS - Ostomy and/or Continence Assessment and Care []  - Incontinence Assessment and Management 0 []  - 0 Ostomy Care Assessment and Management (repouching, etc.) PROCESS - Coordination of Care X - Simple Patient / Family Education for ongoing care 1 15 []  - 0 Complex (extensive) Patient / Family Education for ongoing care X- 1 10 Staff obtains Chiropractor, Records, Test Results / Process Orders []  - 0 Staff telephones HHA, Nursing Homes / Clarify orders / etc []  - 0 Routine Transfer to another Facility (non-emergent condition) []  - 0 Routine Hospital Admission (non-emergent condition) []  - 0 New Admissions / Manufacturing engineer / Ordering NPWT, Apligraf, etc. []  - 0 Emergency Hospital Admission (emergent condition) X- 1 10 Simple Discharge Coordination [name], [name] ([id number]) []  - 0 Complex (extensive) Discharge Coordination PROCESS - Special Needs []  - Pediatric / Minor Patient Management 0 []  - 0 Isolation Patient Management []  - 0 Hearing / Language / Visual special needs []  - 0 Assessment of Community assistance (transportation, D/C planning, etc.) []  - 0 Additional assistance / Altered mentation []  - 0 Support Surface(s) Assessment (bed, cushion, seat,  etc.) INTERVENTIONS - Wound Cleansing / Measurement []  - Simple Wound Cleansing - one wound 0 X- 8 5 Complex Wound Cleansing - multiple wounds X- 1 5 Wound Imaging (photographs - any number of  wounds) []  - 0 Wound Tracing (instead of photographs) []  - 0 Simple Wound Measurement - one wound X- 8 5 Complex Wound Measurement - multiple wounds INTERVENTIONS - Wound Dressings []  - Small Wound Dressing one or multiple wounds 0 []  - 0 Medium Wound Dressing one or multiple wounds X- 2 20 Large Wound Dressing one or multiple wounds []  - 0 Application of Medications - topical []  - 0 Application of Medications - injection INTERVENTIONS - Miscellaneous []  - External ear exam 0 []  - 0 Specimen Collection (cultures, biopsies, blood, body fluids, etc.) []  - 0 Specimen(s) / Culture(s) sent or taken to Lab for analysis []  - 0 Patient Transfer (multiple staff / Nurse, adult / Similar devices) []  - 0 Simple Staple / Suture removal (25 or less) []  - 0 Complex Staple / Suture removal (26 or more) []  - 0 Hypo / Hyperglycemic Management (close monitor of Blood Glucose) []  - 0 Ankle / Brachial Index (ABI) - do not check if billed separately X- 1 5 Vital Signs [name], [name] ([id number]) Has the patient been seen at the hospital within the last three years: Yes Total Score: 210 Level Of Care: New/Established - Level 5 Electronic Signature(s) Signed: 11/20/2017 4:24:03 PM By: Elliot Gurney, BSN, RN, CWS, Kim RN, BSN Entered By: Elliot Gurney, BSN, RN, CWS, Kim on 11/20/2017 15:26:59 [name], [name] ([id number]) -------------------------------------------------------------------------------- Encounter Discharge Information Details Patient Name: [name], [name] Date of Service: 11/20/2017 2:45 PM Medical Record Number: [id number] Patient Account Number: [id number] Date of Birth/Sex: 03-19-1971 (47 y.o. Male) Treating RN: Curtis Sites Primary Care Adrijana Haros: PATIENT, NO Other Clinician: Referring Annalycia Done:  Minna Antis Treating Homer Miller/Extender: Altamese [place] in Treatment: 11 Encounter Discharge Information Items Discharge Pain Level: 0 Discharge Condition: Stable Ambulatory Status: Ambulatory Discharge Destination: Home Transportation: Private Auto Accompanied By: gilrfriend Schedule Follow-up Appointment: Yes Medication Reconciliation completed and No provided to Patient/Care Koran Seabrook: Provided on Clinical Summary of Care: 11/20/2017 Form Type Recipient Paper Patient DC Electronic Signature(s) Signed: 11/21/2017 11:42:25 AM By: Gwenlyn Perking Entered By: Gwenlyn Perking on 11/20/2017 15:44:03 Rattigan, Paublo A. ([id number]) -------------------------------------------------------------------------------- Lower Extremity Assessment Details Patient Name: [name], Azavier A. Date of Service: 11/20/2017 2:45 PM Medical Record Number: [id number] Patient Account Number: [id number] Date of Birth/Sex: 1970-12-10 (47 y.o. Male) Treating RN: Renne Crigler Primary Care Kaiea Esselman: PATIENT, NO Other Clinician: Referring Rosland Riding: Minna Antis Treating Jenavive Lamboy/Extender: Altamese [place] in Treatment: 11 Vascular Assessment Pulses: Dorsalis Pedis Palpable: [Left:Yes] [Right:Yes] Posterior Tibial Extremity colors, hair growth, and conditions: Extremity Color: [Left:Normal] [Right:Normal] Temperature of Extremity: [Left:Cool] [Right:Cool] Capillary Refill: [Left:< 3 seconds] [Right:< 3 seconds] Toe Nail Assessment Left: Right: Thick: Yes Yes Discolored: Yes Yes Deformed: Yes Yes Improper Length and Hygiene: No No Electronic Signature(s) Signed: 11/20/2017 4:15:18 PM By: Renne Crigler Entered By: Renne Crigler on 11/20/2017 14:57:12 Amburn, Esmond A. ([id number]) -------------------------------------------------------------------------------- Multi Wound Chart Details Patient Name: [name], Dwayn A. Date of Service: 11/20/2017 2:45 PM Medical Record Number:  [id number] Patient Account Number: [id number] Date of Birth/Sex: 11-Mar-1971 (47 y.o. Male) Treating RN: Huel Coventry Primary Care Cherolyn Behrle: PATIENT, NO Other Clinician: Referring Chevelle Durr: Minna Antis Treating Bronda Alfred/Extender: Altamese [place] in Treatment: 11 Vital Signs Height(in): 71 Pulse(bpm): 73 Weight(lbs): 180 Blood Pressure(mmHg): 109/75 Body Mass Index(BMI): 25 Temperature(F): 98.2 Respiratory Rate 18 (breaths/min): Photos: [1:No Photos] [3:No Photos] [4:No Photos] Wound Location: [1:Right Toe - Web between 4th and 5th] [3:Left Toe - Web between 4th and 5th] [4:Left Toe - Web between 3rd and 4th] Wounding Event: [1:Gradually Appeared] [  3:Pressure Injury] [4:Gradually Appeared] Primary Etiology: [1:Fungal] [3:Fungal] [4:Fungal] Date Acquired: [1:08/05/2017] [3:10/13/2017] [4:10/16/2017] Weeks of Treatment: [1:11] [3:5] [4:4] Wound Status: [1:Open] [3:Open] [4:Open] Pending Amputation on [1:Yes] [3:No] [4:No] Presentation: Measurements L x W x D [1:2.5x2.5x0.1] [3:4.5x2x0.1] [4:3.8x2x0.1] (cm) Area (cm) : [1:4.909] [3:7.069] [4:5.969] Volume (cm) : [1:0.491] [3:0.707] [4:0.597] % Reduction in Area: [1:-92.90%] [3:-114.30%] [4:-26.70%] % Reduction in Volume: [1:-93.30%] [3:-114.20%] [4:-26.80%] Classification: [1:Partial Thickness] [3:Full Thickness Without Exposed Support Structures] [4:Partial Thickness] Exudate Amount: [1:Large] [3:Large] [4:Large] Exudate Type: [1:Serous] [3:Serosanguineous] [4:Serosanguineous] Exudate Color: [1:amber] [3:red, brown] [4:red, brown] Foul Odor After Cleansing: [1:Yes] [3:Yes] [4:No] Odor Anticipated Due to [1:No] [3:No] [4:N/A] Product Use: Wound Margin: [1:Flat and Intact] [3:Flat and Intact] [4:Distinct, outline attached] Granulation Amount: [1:Large (67-100%)] [3:Large (67-100%)] [4:Large (67-100%)] Granulation Quality: [1:Red] [3:Red] [4:Red] Necrotic Amount: [1:Small (1-33%)] [3:Small (1-33%)] [4:Small  (1-33%)] Exposed Structures: [1:Fascia: No Fat Layer (Subcutaneous Tissue) Exposed: No Tendon: No Muscle: No Joint: No Bone: No] [3:Fat Layer (Subcutaneous Tissue) Exposed: Yes Fascia: No Tendon: No Muscle: No Joint: No Bone: No] [4:Fascia: No Fat Layer (Subcutaneous Tissue)  Exposed: No Tendon: No Muscle: No Joint: No Bone: No] Epithelialization: [1:None] [3:None] [4:None] Periwound Skin Texture: [name], [name] ([id number]) Excoriation: Yes Excoriation: Yes Excoriation: Yes Induration: Yes Induration: No Induration: No Callus: No Callus: No Callus: No Crepitus: No Crepitus: No Crepitus: No Rash: No Rash: No Rash: No Scarring: No Scarring: No Scarring: No Periwound Skin Moisture: Maceration: Yes Maceration: Yes Maceration: Yes Dry/Scaly: No Dry/Scaly: No Dry/Scaly: No Periwound Skin Color: Atrophie Blanche: No Atrophie Blanche: No Atrophie Blanche: No Cyanosis: No Cyanosis: No Cyanosis: No Ecchymosis: No Ecchymosis: No Ecchymosis: No Erythema: No Erythema: No Erythema: No Hemosiderin Staining: No Hemosiderin Staining: No Hemosiderin Staining: No Mottled: No Mottled: No Mottled: No Pallor: No Pallor: No Pallor: No Rubor: No Rubor: No Rubor: No Temperature: No Abnormality No Abnormality No Abnormality Tenderness on Palpation: Yes Yes Yes Wound Preparation: Ulcer Cleansing: Ulcer Cleansing: Ulcer Cleansing: Rinsed/Irrigated with Saline Rinsed/Irrigated with Saline Rinsed/Irrigated with Saline Topical Anesthetic Applied: Topical Anesthetic Applied: Topical Anesthetic Applied: None None None Wound Number: [id number]  Photos: No Photos No Photos No Photos Wound Location: Right Toe - Web between 3rd Left Toe - Web between 2nd Left Foot - Plantar and 4th and 3rd Wounding Event: Gradually Appeared Gradually Appeared Gradually Appeared Primary Etiology: Fungal Fungal Fungal Date Acquired: 10/16/2017 10/23/2017 10/23/2017 Weeks of Treatment: 4 2 2  Wound Status: Open  Open Open Pending Amputation on No No No Presentation: Measurements L x W x D 4x2x0.1 5x2x0.1 1x7.5x0.1 (cm) Area (cm) : 6.283 7.854 5.89 Volume (cm) : 0.628 0.785 0.589 % Reduction in Area: -433.40% -60.00% -4.20% % Reduction in Volume: -432.20% -59.90% -4.20% Classification: Partial Thickness Full Thickness Without Full Thickness Without Exposed Support Structures Exposed Support Structures Exudate Amount: Large Large Large Exudate Type: Serosanguineous Serous Serous Exudate Color: red, brown amber amber Foul Odor After Cleansing: No Yes Yes Odor Anticipated Due to N/A No No Product Use: Wound Margin: Distinct, outline attached Distinct, outline attached Distinct, outline attached Granulation Amount: Large (67-100%) Large (67-100%) Large (67-100%) Granulation Quality: Red Red Red Necrotic Amount: Small (1-33%) Small (1-33%) Small (1-33%) Exposed Structures: Fascia: No Fascia: No Fascia: No Fat Layer (Subcutaneous Fat Layer (Subcutaneous Fat Layer (Subcutaneous Tissue) Exposed: No Tissue) Exposed: No Tissue) Exposed: No Tendon: No Tendon: No Tendon: No Muscle: No Muscle: No Muscle: No [name], [name] ([id number]) Joint: No Joint: No Joint: No Bone: No Bone: No Bone: No Epithelialization: None None  None Periwound Skin Texture: Excoriation: Yes No Abnormalities Noted No Abnormalities Noted Induration: No Callus: No Crepitus: No Rash: No Scarring: No Periwound Skin Moisture: Maceration: Yes Maceration: Yes Maceration: Yes Dry/Scaly: No Periwound Skin Color: Atrophie Blanche: No No Abnormalities Noted No Abnormalities Noted Cyanosis: No Ecchymosis: No Erythema: No Hemosiderin Staining: No Mottled: No Pallor: No Rubor: No Temperature: No Abnormality No Abnormality No Abnormality Tenderness on Palpation: Yes Yes Yes Wound Preparation: Ulcer Cleansing: Ulcer Cleansing: Ulcer Cleansing: Rinsed/Irrigated with Saline Rinsed/Irrigated with Saline  Rinsed/Irrigated with Saline Topical Anesthetic Applied: Topical Anesthetic Applied: Topical Anesthetic Applied: None Other: lidocaine 4% None Wound Number: [id number] N/A Photos: No Photos No Photos N/A Wound Location: Left Foot - Dorsal Left Toe - Web between 1st N/A and 2nd Wounding Event: Gradually Appeared Gradually Appeared N/A Primary Etiology: Fungal Fungal N/A Date Acquired: 10/23/2017 10/23/2017 N/A Weeks of Treatment: 2 2 N/A Wound Status: Open Open N/A Pending Amputation on No No N/A Presentation: Measurements L x W x D 2x4x0.1 5.3x3x0.1 N/A (cm) Area (cm) : 6.283 12.488 N/A Volume (cm) : 0.628 1.249 N/A % Reduction in Area: -3105.60% -960.10% N/A % Reduction in Volume: -3040.00% -958.50% N/A Classification: Full Thickness Without Full Thickness Without N/A Exposed Support Structures Exposed Support Structures Exudate Amount: Large Large N/A Exudate Type: Serous Serous N/A Exudate Color: amber amber N/A Foul Odor After Cleansing: Yes Yes N/A Odor Anticipated Due to No No N/A Product Use: Wound Margin: Distinct, outline attached Distinct, outline attached N/A Granulation Amount: Large (67-100%) Large (67-100%) N/A Granulation Quality: Red Red N/A Necrotic Amount: None Present (0%) Small (1-33%) N/A Exposed Structures: N/A [name], [name] ([id number]) Fascia: No Fascia: No Fat Layer (Subcutaneous Fat Layer (Subcutaneous Tissue) Exposed: No Tissue) Exposed: No Tendon: No Tendon: No Muscle: No Muscle: No Joint: No Joint: No Bone: No Bone: No Epithelialization: None None N/A Periwound Skin Texture: No Abnormalities Noted No Abnormalities Noted N/A Periwound Skin Moisture: Maceration: Yes Maceration: Yes N/A Periwound Skin Color: No Abnormalities Noted No Abnormalities Noted N/A Temperature: No Abnormality No Abnormality N/A Tenderness on Palpation: Yes Yes N/A Wound Preparation: Ulcer Cleansing: Ulcer Cleansing: N/A Rinsed/Irrigated with Saline  Rinsed/Irrigated with Saline Topical Anesthetic Applied: Topical Anesthetic Applied: None None Treatment Notes Wound #1 (Right Toe - Web between 4th and 5th) 1. Cleansed with: Clean wound with Normal Saline 2. Anesthetic Topical Lidocaine 4% cream to wound bed prior to debridement 4. Dressing Applied: Other dressing (specify in notes) 5. Secondary Dressing Applied ABD Pad Dry Gauze Kerlix/Conform 7. Secured with Tape Notes silvercel Wound #3 (Left Toe - Web between 4th and 5th) 1. Cleansed with: Clean wound with Normal Saline 2. Anesthetic Topical Lidocaine 4% cream to wound bed prior to debridement 4. Dressing Applied: Other dressing (specify in notes) 5. Secondary Dressing Applied ABD Pad Dry Gauze Kerlix/Conform 7. Secured with Tape Notes silvercel Wound #4 (Left Toe - Web between 3rd and 4th) 1. Cleansed with: Franey, [name] ([id number]) Clean wound with Normal Saline 2. Anesthetic Topical Lidocaine 4% cream to wound bed prior to debridement 4. Dressing Applied: Other dressing (specify in notes) 5. Secondary Dressing Applied ABD Pad Dry Gauze Kerlix/Conform 7. Secured with Tape Notes silvercel Wound #5 (Right Toe - Web between 3rd and 4th) 1. Cleansed with: Clean wound with Normal Saline 2. Anesthetic Topical Lidocaine 4% cream to wound bed prior to debridement 4. Dressing Applied: Other dressing (specify in notes) 5. Secondary Dressing Applied ABD Pad Dry Gauze Kerlix/Conform 7. Secured with Tape Notes silvercel Wound #  6 (Left Toe - Web between 2nd and 3rd) 1. Cleansed with: Clean wound with Normal Saline 2. Anesthetic Topical Lidocaine 4% cream to wound bed prior to debridement 4. Dressing Applied: Other dressing (specify in notes) 5. Secondary Dressing Applied ABD Pad Dry Gauze Kerlix/Conform 7. Secured with Tape Notes silvercel Wound #7 (Left, Plantar Foot) 1. Cleansed with: Clean wound with Normal Saline 2. Anesthetic Topical  Lidocaine 4% cream to wound bed prior to debridement 4. Dressing Applied: Dhaliwal, Richad A. ([id number]) Other dressing (specify in notes) 5. Secondary Dressing Applied ABD Pad Dry Gauze Kerlix/Conform 7. Secured with Tape Notes silvercel Wound #8 (Left, Dorsal Foot) 1. Cleansed with: Clean wound with Normal Saline 2. Anesthetic Topical Lidocaine 4% cream to wound bed prior to debridement 4. Dressing Applied: Other dressing (specify in notes) 5. Secondary Dressing Applied ABD Pad Dry Gauze Kerlix/Conform 7. Secured with Tape Notes silvercel Wound #9 (Left Toe - Web between 1st and 2nd) 1. Cleansed with: Clean wound with Normal Saline 2. Anesthetic Topical Lidocaine 4% cream to wound bed prior to debridement 4. Dressing Applied: Other dressing (specify in notes) 5. Secondary Dressing Applied ABD Pad Dry Gauze Kerlix/Conform 7. Secured with Tape Notes silvercel Electronic Signature(s) Signed: 11/20/2017 5:10:26 PM By: Baltazar Najjar MD Entered By: Baltazar Najjar on 11/20/2017 16:03:45 Dewilde, Grayling A. ([id number]) -------------------------------------------------------------------------------- Multi-Disciplinary Care Plan Details Patient Name: [name], Qualyn A. Date of Service: 11/20/2017 2:45 PM Medical Record Number: [id number] Patient Account Number: [id number] Date of Birth/Sex: 04-Nov-1970 (47 y.o. Male) Treating RN: Huel Coventry Primary Care Jireh Vinas: PATIENT, NO Other Clinician: Referring Maddock Finigan: Minna Antis Treating Lamarkus Nebel/Extender: Altamese [place] in Treatment: 11 Active Inactive ` Abuse / Safety / Falls / Self Care Management Nursing Diagnoses: Potential for falls Goals: Patient will not experience any injury related to falls Date Initiated: 09/04/2017 Target Resolution Date: 12/21/2017 Goal Status: Active Interventions: Assess impairment of mobility on admission and as needed per policy Assess personal safety and home safety (as indicated)  on admission and as needed Assess self care needs on admission and as needed Provide education on basic hygiene Provide education on fall prevention Treatment Activities: Education provided on Basic Hygiene : 09/04/2017 Notes: ` Nutrition Nursing Diagnoses: Imbalanced nutrition Potential for alteratiion in Nutrition/Potential for imbalanced nutrition Goals: Patient/caregiver agrees to and verbalizes understanding of need to use nutritional supplements and/or vitamins as prescribed Date Initiated: 09/04/2017 Target Resolution Date: 12/21/2017 Goal Status: Active Interventions: Assess patient nutrition upon admission and as needed per policy Notes: ` Orientation to the Wound Care Program Antelope, Derelle A. ([id number]) Nursing Diagnoses: Knowledge deficit related to the wound healing center program Goals: Patient/caregiver will verbalize understanding of the Wound Healing Center Program Date Initiated: 09/04/2017 Target Resolution Date: 09/21/2017 Goal Status: Active Interventions: Provide education on orientation to the wound center Notes: ` Pain, Acute or Chronic Nursing Diagnoses: Pain, acute or chronic: actual or potential Potential alteration in comfort, pain Goals: Patient/caregiver will verbalize adequate pain control between visits Date Initiated: 09/04/2017 Target Resolution Date: 11/23/2017 Goal Status: Active Interventions: Complete pain assessment as per visit requirements Notes: ` Soft Tissue Infection Nursing Diagnoses: Impaired tissue integrity Knowledge deficit related to disease process and management Knowledge deficit related to home infection control: handwashing, handling of soiled dressings, supply storage Potential for infection: soft tissue Goals: Patient/caregiver will verbalize understanding of or measures to prevent infection and contamination in the home setting Date Initiated: 09/04/2017 Target Resolution Date: 11/23/2017 Goal Status:  Active Signs and symptoms of infection will be recognized early  to allow for prompt treatment Date Initiated: 09/04/2017 Target Resolution Date: 12/21/2017 Goal Status: Active Interventions: Assess signs and symptoms of infection every visit Provide education on infection Treatment Activities: Education provided on Infection : 09/04/2017 Besecker, Mae A. ([id number]) Notes: ` Wound/Skin Impairment Nursing Diagnoses: Impaired tissue integrity Knowledge deficit related to smoking impact on wound healing Knowledge deficit related to ulceration/compromised skin integrity Goals: Ulcer/skin breakdown will have a volume reduction of 80% by week 12 Date Initiated: 09/04/2017 Target Resolution Date: 12/21/2017 Goal Status: Active Interventions: Assess patient/caregiver ability to perform ulcer/skin care regimen upon admission and as needed Assess ulceration(s) every visit Provide education on smoking Provide education on ulcer and skin care Notes: Electronic Signature(s) Signed: 11/20/2017 4:24:03 PM By: Elliot Gurney, BSN, RN, CWS, Kim RN, BSN Entered By: Elliot Gurney, BSN, RN, CWS, Kim on 11/20/2017 15:19:31 Mendia, Margaret A. ([id number]) -------------------------------------------------------------------------------- Pain Assessment Details Patient Name: [name], Elihu A. Date of Service: 11/20/2017 2:45 PM Medical Record Number: [id number] Patient Account Number: [id number] Date of Birth/Sex: 03/04/1971 (47 y.o. Male) Treating RN: Renne Crigler Primary Care Alson Mcpheeters: PATIENT, NO Other Clinician: Referring Niambi Smoak: Minna Antis Treating Halston Kintz/Extender: Altamese [place] in Treatment: 11 Active Problems Location of Pain Severity and Description of Pain Patient Has Paino Yes Site Locations Pain Location: Pain in Ulcers Rate the pain. Current Pain Level: 2 Character of Pain Describe the Pain: Aching, Burning Pain Management and Medication Current Pain Management: Notes Topical or  injectable lidocaine is offered to patient for acute pain when surgical debridement is performed. If needed, Patient is instructed to use over the counter pain medication for the following 24-48 hours after debridement. Wound care MDs do not prescribed pain medications. Patient has chronic pain or uncontrolled pain. Patient has been instructed to make an appointment with their Primary Care Physician for pain management. Electronic Signature(s) Signed: 11/20/2017 4:15:18 PM By: Renne Crigler Entered By: Renne Crigler on 11/20/2017 14:44:11 Montroy, Pershing A. ([id number]) -------------------------------------------------------------------------------- Patient/Caregiver Education Details Patient Name: [name], Harlie A. Date of Service: 11/20/2017 2:45 PM Medical Record Number: [id number] Patient Account Number: [id number] Date of Birth/Gender: 11/13/1970 (47 y.o. Male) Treating RN: Curtis Sites Primary Care Physician: PATIENT, NO Other Clinician: Referring Physician: Minna Antis Treating Physician/Extender: Altamese [place] in Treatment: 11 Education Assessment Education Provided To: Patient and Caregiver Education Topics Provided Wound/Skin Impairment: Handouts: Other: wound care to continue as ordered Methods: Demonstration, Explain/Verbal Responses: State content correctly Electronic Signature(s) Signed: 11/20/2017 4:38:42 PM By: Curtis Sites Entered By: Curtis Sites on 11/20/2017 15:43:33 Reade, Mateusz A. ([id number]) -------------------------------------------------------------------------------- Wound Assessment Details Patient Name: [name], Bronson A. Date of Service: 11/20/2017 2:45 PM Medical Record Number: [id number] Patient Account Number: [id number] Date of Birth/Sex: 12-09-1970 (47 y.o. 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 Wound Status: Open Wounding Event: Gradually Appeared Date Acquired: 08/05/2017 Weeks Of Treatment: 11 Clustered Wound: No Pending Amputation On Presentation Photos Photo Uploaded By: Elliot Gurney, BSN, RN, CWS, Kim on 11/20/2017 16:35:09 Wound Measurements Length: (cm) 2.5 Width: (cm) 2.5 Depth: (cm) 0.1 Area: (cm) 4.909 Volume: (cm) 0.491 % Reduction in Area: -92.9% % Reduction in Volume: -93.3% Epithelialization: None Tunneling: No Undermining: No Wound Description Classification: Partial Thickness Wound Margin: Flat and Intact Exudate Amount: Large Exudate Type: Serous Exudate Color: amber Foul Odor After Cleansing: Yes Due to Product Use:  No Slough/Fibrino No Wound Bed Granulation Amount: Large (67-100%) Exposed Structure Granulation Quality: Red Fascia Exposed: No Necrotic Amount: Small (1-33%) Fat Layer (Subcutaneous Tissue) Exposed: No Necrotic Quality: Adherent Slough Tendon Exposed: No Muscle Exposed: No Joint Exposed: No Bone Exposed: No Periwound Skin Texture Ashby, Braysen A. ([id number]) Texture Color No Abnormalities Noted: No No Abnormalities Noted: No Callus: No Atrophie Blanche: No Crepitus: No Cyanosis: No Excoriation: Yes Ecchymosis: No Induration: Yes Erythema: No Rash: No Hemosiderin Staining: No Scarring: No Mottled: No Pallor: No Moisture Rubor: No No Abnormalities Noted: No Dry / Scaly: No Temperature / Pain Maceration: Yes Temperature: No Abnormality Tenderness on Palpation: Yes Wound Preparation Ulcer Cleansing: Rinsed/Irrigated with Saline Topical Anesthetic Applied: None Treatment Notes Wound #1 (Right Toe - Web between 4th and 5th) 1. Cleansed with: Clean wound with Normal Saline 2. Anesthetic Topical Lidocaine 4% cream to wound bed prior to debridement 4. Dressing Applied: Other dressing (specify  in notes) 5. Secondary Dressing Applied ABD Pad Dry Gauze Kerlix/Conform 7. Secured with Tape Notes silvercel Electronic Signature(s) Signed: 11/20/2017 2:58:44 PM By: Alejandro Mulling Signed: 11/20/2017 4:15:18 PM By: Renne Crigler Entered By: Alejandro Mulling on 11/20/2017 14:58:44 [place], Terek A. ([id number]) -------------------------------------------------------------------------------- Wound Assessment Details Patient Name: [name], Alexsander A. Date of Service: 11/20/2017 2:45 PM Medical Record Number: [id number] Patient Account Number: [id number] Date of Birth/Sex: Jun 02, 1971 (47 y.o. Male) Treating RN: Renne Crigler Primary Care Treyshon Buchanon: PATIENT, NO Other Clinician: Referring Maxwell Martorano: Minna Antis Treating Jaeley Wiker/Extender: Maxwell Caul Weeks in Treatment: 11 Wound Status Wound Number: 3 Primary Etiology: Fungal Wound Location: Left Toe - Web between 4th and 5th Wound Status: Open Wounding Event: Pressure Injury Date Acquired: 10/13/2017 Weeks Of Treatment: 5 Clustered Wound: No Photos Photo Uploaded By: Elliot Gurney, BSN, RN, CWS, Kim on 11/20/2017 16:35:10 Wound Measurements Length: (cm) 4.5 Width: (cm) 2 Depth: (cm) 0.1 Area: (cm) 7.069 Volume: (cm) 0.707 % Reduction in Area: -114.3% % Reduction in Volume: -114.2% Epithelialization: None Tunneling: No Undermining: No Wound Description Full Thickness Without Exposed Support Classification: Structures Wound Margin: Flat and Intact Exudate Large Amount: Exudate Type: Serosanguineous Exudate Color: red, brown Foul Odor After Cleansing: Yes Due to Product Use: No Slough/Fibrino Yes Wound Bed Granulation Amount: Large (67-100%) Exposed Structure Granulation Quality: Red Fascia Exposed: No Necrotic Amount: Small (1-33%) Fat Layer (Subcutaneous Tissue) Exposed: Yes Necrotic Quality: Adherent Slough Tendon Exposed: No Muscle Exposed: No Joint Exposed: No Bone Exposed: No Winograd, Jacobie A.  ([id number]) Periwound Skin Texture Texture Color No Abnormalities Noted: No No Abnormalities Noted: No Callus: No Atrophie Blanche: No Crepitus: No Cyanosis: No Excoriation: Yes Ecchymosis: No Induration: No Erythema: No Rash: No Hemosiderin Staining: No Scarring: No Mottled: No Pallor: No Moisture Rubor: No No Abnormalities Noted: No Dry / Scaly: No Temperature / Pain Maceration: Yes Temperature: No Abnormality Tenderness on Palpation: Yes Wound Preparation Ulcer Cleansing: Rinsed/Irrigated with Saline Topical Anesthetic Applied: None Treatment Notes Wound #3 (Left Toe - Web between 4th and 5th) 1. Cleansed with: Clean wound with Normal Saline 2. Anesthetic Topical Lidocaine 4% cream to wound bed prior to debridement 4. Dressing Applied: Other dressing (specify in notes) 5. Secondary Dressing Applied ABD Pad Dry Gauze Kerlix/Conform 7. Secured with Tape Notes silvercel Electronic Signature(s) Signed: 11/20/2017 2:59:01 PM By: Alejandro Mulling Signed: 11/20/2017 4:15:18 PM By: Renne Crigler Entered By: Alejandro Mulling on 11/20/2017 14:59:01 Gander, Yousaf A. ([id number]) -------------------------------------------------------------------------------- Wound Assessment Details Patient Name: [name], Spenser A. Date of Service: 11/20/2017 2:45 PM Medical Record Number: [id number] Patient Account Number: [id number]  Date of Birth/Sex: 1971-06-16 (47 y.o. 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 between 3rd and 4th Wound Status: Open Wounding Event: Gradually Appeared Date Acquired: 10/16/2017 Weeks Of Treatment: 4 Clustered Wound: No Photos Photo Uploaded By: Elliot Gurney, BSN, RN, CWS, Kim on 11/20/2017 16:36:57 Wound  Measurements Length: (cm) 3.8 Width: (cm) 2 Depth: (cm) 0.1 Area: (cm) 5.969 Volume: (cm) 0.597 % Reduction in Area: -26.7% % Reduction in Volume: -26.8% Epithelialization: None Tunneling: No Undermining: No Wound Description Classification: Partial Thickness Wound Margin: Distinct, outline attached Exudate Amount: Large Exudate Type: Serosanguineous Exudate Color: red, brown Foul Odor After Cleansing: No Slough/Fibrino Yes Wound Bed Granulation Amount: Large (67-100%) Exposed Structure Granulation Quality: Red Fascia Exposed: No Necrotic Amount: Small (1-33%) Fat Layer (Subcutaneous Tissue) Exposed: No Necrotic Quality: Adherent Slough Tendon Exposed: No Muscle Exposed: No Joint Exposed: No Bone Exposed: No Periwound Skin Texture [name], [name] ([id number]) Texture Color No Abnormalities Noted: No No Abnormalities Noted: No Callus: No Atrophie Blanche: No Crepitus: No Cyanosis: No Excoriation: Yes Ecchymosis: No Induration: No Erythema: No Rash: No Hemosiderin Staining: No Scarring: No Mottled: No Pallor: No Moisture Rubor: No No Abnormalities Noted: No Dry / Scaly: No Temperature / Pain Maceration: Yes Temperature: No Abnormality Tenderness on Palpation: Yes Wound Preparation Ulcer Cleansing: Rinsed/Irrigated with Saline Topical Anesthetic Applied: None Treatment Notes Wound #4 (Left Toe - Web between 3rd and 4th) 1. Cleansed with: Clean wound with Normal Saline 2. Anesthetic Topical Lidocaine 4% cream to wound bed prior to debridement 4. Dressing Applied: Other dressing (specify in notes) 5. Secondary Dressing Applied ABD Pad Dry Gauze Kerlix/Conform 7. Secured with Tape Notes silvercel Electronic Signature(s) Signed: 11/20/2017 2:59:19 PM By: Alejandro Mulling Signed: 11/20/2017 4:15:18 PM By: Renne Crigler Entered By: Alejandro Mulling on 11/20/2017 14:59:18 Osorto, Yani A.  ([id number]) -------------------------------------------------------------------------------- Wound Assessment Details Patient Name: [name], Gregroy A. Date of Service: 11/20/2017 2:45 PM Medical Record Number: [id number] Patient Account Number: [id number] Date of Birth/Sex: 12-15-1970 (47 y.o. 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 Wound Status: Open Wounding Event: Gradually Appeared Date Acquired: 10/16/2017 Weeks Of Treatment: 4 Clustered Wound: No Photos Photo Uploaded By: Elliot Gurney, BSN, RN, CWS, Kim on 11/20/2017 16:36:58 Wound Measurements Length: (cm) 4 Width: (cm) 2 Depth: (cm) 0.1 Area: (cm) 6.283 Volume: (cm) 0.628 % Reduction in Area: -433.4% % Reduction in Volume: -432.2% Epithelialization: None Tunneling: No Wound Description Classification: Partial Thickness Wound Margin: Distinct, outline attached Exudate Amount: Large Exudate Type: Serosanguineous Exudate Color: red, brown Foul Odor After Cleansing: No Slough/Fibrino Yes Wound Bed Granulation Amount: Large (67-100%) Exposed Structure Granulation Quality: Red Fascia Exposed: No Necrotic Amount: Small (1-33%) Fat Layer (Subcutaneous Tissue) Exposed: No Necrotic Quality: Adherent Slough Tendon Exposed: No Muscle Exposed: No Joint Exposed: No Bone Exposed: No Periwound Skin Texture Van, Khang A. ([id number]) Texture Color No Abnormalities Noted: No No Abnormalities Noted: No Callus: No Atrophie Blanche: No Crepitus: No Cyanosis: No Excoriation: Yes Ecchymosis: No Induration: No Erythema: No Rash: No Hemosiderin Staining: No Scarring: No Mottled: No Pallor: No Moisture Rubor: No No Abnormalities Noted: No Dry / Scaly: No Temperature /  Pain  Maceration: Yes Temperature: No Abnormality Tenderness on Palpation: Yes Wound Preparation Ulcer Cleansing: Rinsed/Irrigated with Saline Topical Anesthetic Applied: None Treatment Notes Wound #5 (Right Toe - Web between 3rd and 4th) 1. Cleansed with: Clean wound with Normal Saline 2. Anesthetic Topical Lidocaine 4% cream to wound bed prior to debridement 4. Dressing Applied: Other dressing (specify in notes) 5. Secondary Dressing Applied ABD Pad Dry Gauze Kerlix/Conform 7. Secured with Tape Notes silvercel Electronic Signature(s) Signed: 11/20/2017 2:59:31 PM By: Alejandro Mulling Signed: 11/20/2017 4:15:18 PM By: Renne Crigler Entered By: Alejandro Mulling on 11/20/2017 14:59:31 Basden, Shaymus A. ([id number]) -------------------------------------------------------------------------------- Wound Assessment Details Patient Name: [name], Walter A. Date of Service: 11/20/2017 2:45 PM Medical Record Number: [id number] Patient Account Number: [id number] Date of Birth/Sex: July 01, 1971 (47 y.o. Male) Treating RN: Renne Crigler Primary Care Piero Mustard: PATIENT, NO Other Clinician: Referring Jazma Pickel: Minna Antis Treating Lem Peary/Extender: Maxwell Caul Weeks in Treatment: 11 Wound Status Wound Number: 6 Primary Etiology: Fungal Wound Location: Left Toe - Web between 2nd and 3rd Wound Status: Open Wounding Event: Gradually Appeared Date Acquired: 10/23/2017 Weeks Of Treatment: 2 Clustered Wound: No Photos Photo Uploaded By: Elliot Gurney, BSN, RN, CWS, Kim on 11/20/2017 16:38:11 Wound Measurements Length: (cm) 5 Width: (cm) 2 Depth: (cm) 0.1 Area: (cm) 7.854 Volume: (cm) 0.785 % Reduction in Area: -60% % Reduction in Volume: -59.9% Epithelialization: None Tunneling: No Undermining: No Wound Description Full Thickness Without Exposed Support Classification: Structures Wound Margin: Distinct, outline attached Exudate Large Amount: Exudate Type: Serous Exudate  Color: amber Foul Odor After Cleansing: Yes Due to Product Use: No Slough/Fibrino Yes Wound Bed Granulation Amount: Large (67-100%) Exposed Structure Granulation Quality: Red Fascia Exposed: No Necrotic Amount: Small (1-33%) Fat Layer (Subcutaneous Tissue) Exposed: No Necrotic Quality: Adherent Slough Tendon Exposed: No Muscle Exposed: No Joint Exposed: No Bone Exposed: No Adduci, Damarion A. ([id number]) Periwound Skin Texture Texture Color No Abnormalities Noted: No No Abnormalities Noted: No Moisture Temperature / Pain No Abnormalities Noted: No Temperature: No Abnormality Maceration: Yes Tenderness on Palpation: Yes Wound Preparation Ulcer Cleansing: Rinsed/Irrigated with Saline Topical Anesthetic Applied: Other: lidocaine 4%, Treatment Notes Wound #6 (Left Toe - Web between 2nd and 3rd) 1. Cleansed with: Clean wound with Normal Saline 2. Anesthetic Topical Lidocaine 4% cream to wound bed prior to debridement 4. Dressing Applied: Other dressing (specify in notes) 5. Secondary Dressing Applied ABD Pad Dry Gauze Kerlix/Conform 7. Secured with Tape Notes silvercel Electronic Signature(s) Signed: 11/20/2017 2:59:45 PM By: Alejandro Mulling Signed: 11/20/2017 4:15:18 PM By: Renne Crigler Entered By: Alejandro Mulling on 11/20/2017 14:59:44 Eakle, Digby A. ([id number]) -------------------------------------------------------------------------------- Wound Assessment Details Patient Name: [name], [place]on A. Date of Service: 11/20/2017 2:45 PM Medical Record Number: [id number] Patient Account Number: [id number] Date of Birth/Sex: 07-20-1971 (47 y.o. 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 Wound Status: Open Wounding Event: Gradually  Appeared Date Acquired: 10/23/2017 Weeks Of Treatment: 2 Clustered Wound: No Photos Photo Uploaded By: Elliot Gurney, BSN, RN, CWS, Kim on 11/20/2017 16:38:12 Wound Measurements Length: (cm) 1 Width: (cm) 7.5 Depth: (cm) 0.1 Area: (cm) 5.89 Volume: (cm) 0.589 % Reduction in Area: -4.2% % Reduction in Volume: -4.2% Epithelialization: None Tunneling: No Undermining: No Wound Description Full Thickness Without Exposed Support Classification: Structures Wound Margin: Distinct, outline attached Exudate Large Amount: Exudate Type: Serous Exudate Color: amber Foul Odor After Cleansing: Yes  Due to Product Use: No Slough/Fibrino Yes Wound Bed Granulation Amount: Large (67-100%) Exposed Structure Granulation Quality: Red Fascia Exposed: No Necrotic Amount: Small (1-33%) Fat Layer (Subcutaneous Tissue) Exposed: No Necrotic Quality: Adherent Slough Tendon Exposed: No Muscle Exposed: No Joint Exposed: No Bone Exposed: No Niehoff, Alroy A. ([id number]) Periwound Skin Texture Texture Color No Abnormalities Noted: No No Abnormalities Noted: No Moisture Temperature / Pain No Abnormalities Noted: No Temperature: No Abnormality Maceration: Yes Tenderness on Palpation: Yes Wound Preparation Ulcer Cleansing: Rinsed/Irrigated with Saline Topical Anesthetic Applied: None Treatment Notes Wound #7 (Left, Plantar Foot) 1. Cleansed with: Clean wound with Normal Saline 2. Anesthetic Topical Lidocaine 4% cream to wound bed prior to debridement 4. Dressing Applied: Other dressing (specify in notes) 5. Secondary Dressing Applied ABD Pad Dry Gauze Kerlix/Conform 7. Secured with Tape Notes silvercel Electronic Signature(s) Signed: 11/20/2017 2:59:56 PM By: Alejandro Mulling Signed: 11/20/2017 4:15:18 PM By: Renne Crigler Entered By: Alejandro Mulling on 11/20/2017 14:59:56 Moulder, Tami A. ([id number]) -------------------------------------------------------------------------------- Wound  Assessment Details Patient Name: [name], Pal A. Date of Service: 11/20/2017 2:45 PM Medical Record Number: [id number] Patient Account Number: [id number] Date of Birth/Sex: Dec 24, 1970 (47 y.o. Male) Treating RN: Renne Crigler Primary Care Eron Staat: PATIENT, NO Other Clinician: Referring Briannia Laba: Minna Antis Treating Zalan Shidler/Extender: Maxwell Caul Weeks in Treatment: 11 Wound Status Wound Number: 8 Primary Etiology: Fungal Wound Location: Left Foot - Dorsal Wound Status: Open Wounding Event: Gradually Appeared Date Acquired: 10/23/2017 Weeks Of Treatment: 2 Clustered Wound: No Photos Photo Uploaded By: Elliot Gurney, BSN, RN, CWS, Kim on 11/20/2017 16:38:49 Wound Measurements Length: (cm) 2 Width: (cm) 4 Depth: (cm) 0.1 Area: (cm) 6.283 Volume: (cm) 0.628 % Reduction in Area: -3105.6% % Reduction in Volume: -3040% Epithelialization: None Tunneling: No Undermining: No Wound Description Full Thickness Without Exposed Support Foul Od Classification: Structures Due to Wound Margin: Distinct, outline attached Slough/ Exudate Large Amount: Exudate Type: Serous Exudate Color: amber or After Cleansing: Yes Product Use: No Fibrino Yes Wound Bed Granulation Amount: Large (67-100%) Exposed Structure Granulation Quality: Red Fascia Exposed: No Necrotic Amount: None Present (0%) Fat Layer (Subcutaneous Tissue) Exposed: No Tendon Exposed: No Muscle Exposed: No Joint Exposed: No Bone Exposed: No Raine, Bria A. ([id number]) Periwound Skin Texture Texture Color No Abnormalities Noted: No No Abnormalities Noted: No Moisture Temperature / Pain No Abnormalities Noted: No Temperature: No Abnormality Maceration: Yes Tenderness on Palpation: Yes Wound Preparation Ulcer Cleansing: Rinsed/Irrigated with Saline Topical Anesthetic Applied: None Treatment Notes Wound #8 (Left, Dorsal Foot) 1. Cleansed with: Clean wound with Normal Saline 2. Anesthetic Topical Lidocaine  4% cream to wound bed prior to debridement 4. Dressing Applied: Other dressing (specify in notes) 5. Secondary Dressing Applied ABD Pad Dry Gauze Kerlix/Conform 7. Secured with Tape Notes silvercel Electronic Signature(s) Signed: 11/20/2017 3:00:10 PM By: Alejandro Mulling Signed: 11/20/2017 4:15:18 PM By: Renne Crigler Entered By: Alejandro Mulling on 11/20/2017 15:00:10 Linford, Dmitri A. ([id number]) -------------------------------------------------------------------------------- Wound Assessment Details Patient Name: [name], Galo A. Date of Service: 11/20/2017 2:45 PM Medical Record Number: [id number] Patient Account Number: [id number] Date of Birth/Sex: Feb 01, 1971 (47 y.o. Male) Treating RN: Renne Crigler Primary Care Yechiel Erny: PATIENT, NO Other Clinician: Referring Nadiah Corbit: Minna Antis Treating Dulce Martian/Extender: Maxwell Caul Weeks in Treatment: 11 Wound Status Wound Number: 9 Primary Etiology: Fungal Wound Location: Left Toe - Web between 1st and 2nd Wound Status: Open Wounding Event: Gradually Appeared Date Acquired: 10/23/2017 Weeks Of Treatment: 2 Clustered Wound: No Photos Photo Uploaded By: Elliot Gurney, BSN, RN, CWS, Kim on 11/20/2017 16:38:50 Wound Measurements  Length: (cm) 5.3 Width: (cm) 3 Depth: (cm) 0.1 Area: (cm) 12.488 Volume: (cm) 1.249 % Reduction in Area: -960.1% % Reduction in Volume: -958.5% Epithelialization: None Tunneling: No Undermining: No Wound Description Full Thickness Without Exposed Support Classification: Structures Wound Margin: Distinct, outline attached Exudate Large Amount: Exudate Type: Serous Exudate Color: amber Foul Odor After Cleansing: Yes Due to Product Use: No Slough/Fibrino Yes Wound Bed Granulation Amount: Large (67-100%) Exposed Structure Granulation Quality: Red Fascia Exposed: No Necrotic Amount: Small (1-33%) Fat Layer (Subcutaneous Tissue) Exposed: No Necrotic Quality: Adherent Slough Tendon  Exposed: No Muscle Exposed: No Joint Exposed: No Bone Exposed: No Duguay, Zeferino A. ([id number]) Periwound Skin Texture Texture Color No Abnormalities Noted: No No Abnormalities Noted: No Moisture Temperature / Pain No Abnormalities Noted: No Temperature: No Abnormality Maceration: Yes Tenderness on Palpation: Yes Wound Preparation Ulcer Cleansing: Rinsed/Irrigated with Saline Topical Anesthetic Applied: None Treatment Notes Wound #9 (Left Toe - Web between 1st and 2nd) 1. Cleansed with: Clean wound with Normal Saline 2. Anesthetic Topical Lidocaine 4% cream to wound bed prior to debridement 4. Dressing Applied: Other dressing (specify in notes) 5. Secondary Dressing Applied ABD Pad Dry Gauze Kerlix/Conform 7. Secured with Tape Notes silvercel Electronic Signature(s) Signed: 11/20/2017 3:00:39 PM By: Alejandro Mulling Signed: 11/20/2017 4:15:18 PM By: Renne Crigler Previous Signature: 11/20/2017 3:00:22 PM Version By: Alejandro Mulling Entered By: Alejandro Mulling on 11/20/2017 15:00:38 Knoop, Estil A. ([id number]) -------------------------------------------------------------------------------- Vitals Details Patient Name: [name], Koleson A. Date of Service: 11/20/2017 2:45 PM Medical Record Number: [id number] Patient Account Number: [id number] Date of Birth/Sex: 19-Aug-1971 (47 y.o. Male) Treating RN: Renne Crigler Primary Care Johnnell Liou: PATIENT, NO Other Clinician: Referring Gaudencio Chesnut: Minna Antis Treating Aubreigh Fuerte/Extender: Altamese [place] in Treatment: 11 Vital Signs Time Taken: 14:44 Temperature (F): 98.2 Height (in): 71 Pulse (bpm): 73 Weight (lbs): 180 Respiratory Rate (breaths/min): 18 Body Mass Index (BMI): 25.1 Blood Pressure (mmHg): 109/75 Reference Range: 80 - 120 mg / dl Electronic Signature(s) Signed: 11/20/2017 4:15:18 PM By: Renne Crigler Entered By: Renne Crigler on 11/20/2017 14:46:34

## 2017-11-27 ENCOUNTER — Encounter: Payer: Non-veteran care | Admitting: Internal Medicine

## 2017-11-27 DIAGNOSIS — L97522 Non-pressure chronic ulcer of other part of left foot with fat layer exposed: Secondary | ICD-10-CM | POA: Diagnosis not present

## 2017-11-29 NOTE — Progress Notes (Signed)
[name] ([id number]) Visit Report for 11/27/2017 HPI Details Patient Name: [name], [name] Date of Service: 11/27/2017 3:30 PM Medical Record Number: [id number] Patient Account Number: [id number] Date of Birth/Sex: 28-Apr-1971 (47 y.o. Male) Treating RN: Cornell Barman Primary Care Provider: PATIENT, NO Other Clinician: Referring Provider: Harvest Dark Treating Provider/Extender: Tito Dine in Treatment: 12 History of Present Illness HPI Description: 09/04/17; this is a 47 year old man who was not a diabetic and has no known history of peripheral vascular disease. He does have hyperlipidemia and a history of cocaine abuse although he insists that this is not active. He tells me that about a month ago he started having denuded skin between the toes of his left foot and on the plantar aspect of his left foot. He was picking at the skin and pulling it off. More recently he has had involvement of the toes on the right foot. Week or 2 ago he started getting swelling and pain in the left foot and he was seen in the ER on 08/29/17 he was diagnosed with cellulitis. The only ulcer that they commented on was the left fifth toe. An x-ray suggested ulcer at the fifth MTP slightly osteopenic appearance of the fifth met head question early osteo. He has 14 days of Bactrim DS prescribed and the patient states the swelling and pain in the left foot is improved since starting this 6 days ago. ABIs in this clinic were 1.28 on the right and 1.3 on the left. The patient is a smoker at one half pack per day 09/11/17; patient came last week and in general I think he had severe tinea pedis with secondary bacterial cellulitis. He was on Septra DS and is completed this. I gave him oral terbinafine for the tinea pedis. A culture I did on the left foot last week grew Pseudomonas which is quinolone sensitive. I'll give him 10 days of ciprofloxacin today. However he arrives with his foot looking a lot  better. There is less erythema and less warmth 09/18/17; patient has completed this systemic antifungal therapy and antibiotics for secondary bacterial cellulitis. He is using Lotrimin between his toes and silver alginate. All of this looks a lot better 10/02/17;everything is cleaned up in the patient's bilateral feet except for the right fourth fifth toe web space. The patient had severe tinea pedis with secondary cellulitis. Multiple denuded areas between his toes. We have treated him successfully for all of this however he still has 1 remaining area 10/16/17; patient arrives after a 2 week hiatus he is back at work. Using Lotrimin cream and silver alginate between his toes. He tells me that in the last 2 days he's had denuded areas of skin that he is pulled off. There is no evidence of secondary infection or cellulitis. Not really a lot of evidence of tinea pedis between the toes either. 10/23/17; the patient still has extensive skin breakdown between the bilateral fourth fifth, third fourth and even into the second third toe on the right is been using silver alginate. There is no evidence of secondary infection or cellulitis however this is a lot worse than a week ago 11/06/17; the patient arrives today with his left greater than right foot in not a very good condition. He has denuded skin from basically the base of the second to the fifth toe on the left with skin loss on all his toe webspaces in that distribution. More concerning once again he has erythema in the forefoot which I think  is secondary cellulitis there is an odor and a greenish discoloration probably pseudomonas again. oOn the right foot he has loss of skin in the webspace between the fifth and fourth and the fourth and third toes. Although they state there is an odor here I didn't recognize it. oHe also brings up than when he first went to the ER just before he came here they did an x-ray that suggested the possibility of early  osteomyelitis in the fifth toe on the left. He will need x-rays of this foot again. I will x-ray the right foot as well. He is not a diabetic and does not have PVD oHe is complaining of pain on the lateral part of his foot at the heel but he thinks this might be from walking on the side of his foot to avoid the discomfort of the left forefoot. He has a history of recurrent tinea pedis as teenager when he was in sports. The areas initially responded to oral antibiotics and oral antifungals. Culture of this I did showed pseudomonas last time and the greenish discoloration certainly makes that a possibility. I gave him oral ciprofloxacin for 10 days 500 twice a day and terbinafine 250 a day for 2 weeks. oHe has been using topical ketoconazole for quite a period of time now and silver alginate which also should have antifungal properties. 11/13/17; the patient is not doing well. The area on the right foot looks better however the area on the left does not. He is lost to needed skin on the plantar surface of the foot with loss of skin between all of his toes. There is weeping edema fluid. He complains of itching and burning. I gave him terbinafine last week along with ciprofloxacin to cover the Pseudomonas he [name], [name] ([id number]) cultured when he first came to this clinic however cultures showed pseudomonas and MRSA. X-ray of the foot did not show any osseous abnormalities. This weekend he moved and was up and down stairs in the foot which I'm sure didn't do this any any good however this is not doing well. When he first came to the clinic he cleaned up and about a month ago I sent him back to work he really appeared in the clinic 2 weeks later with his foot a lot worse. I had been using topical ketoconazole along with silver alginate which should be antifungal as well as drying however this is not work on this occasion not at least on the left foot. 11/20/17; no improvement. He continues to  complain of itching and burning pain. The skin on the plantar aspect of his left foot is denuding off with a slightly green discoloration. Bacterial cultures I have done of this area show Pseudomonas. I had him on ciprofloxacin and doxycycline as well as oral terbinafine. Once again he is off work not able to function really because of this. 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 He says he is taking them all this week. I also gave him cefdinir 300 twice a day for 7 days for what I thought was coexistent Pseudomonas skin infection based on prior cultures. We have continued with silver alginate and terbinafine cream. We have made absolutely no  progress in this. The greenish discoloration and drainage on the left seems better but he has full thickness skin loss between all of his toes on the left extending towards the balls of his feet. The areas on the right are also worse. I continue to think this is ulcerative tinea pedis with secondary cellulitis. When I treated him for this when he first came in the clinic he rapidly improved in fact after 2 weeks I think he went back to work but since he has come back I have not been able to get this under control. I have asked for a podiatry consult although it was canceled last week because of insurance he has an appointment locally on the 19th. He apparently is also [facility] eligible at least he was at one point. He left the clinic today saying he was going to go to the emergency room at the [place] in [place] to see if he could get more rapid access to podiatry. If there is an alternative diagnosis here told him I don't know what it is but I have really not been able to help  him over the last several visits. He has also run out of medical insurance I think which has hampered the ability tability to get outside consultation Electronic Signature(s) Signed: 11/27/2017 5:42:24 PM By: Linton Ham MD Entered By: Linton Ham on 11/27/2017 17:35:47 Sardo, [name] ([id number]) -------------------------------------------------------------------------------- Physical Exam Details Patient Name: [name], [name] Date of Service: 11/27/2017 3:30 PM Medical Record Number: [id number] Patient Account Number: [id number] Date of Birth/Sex: 1971/04/18 (47 y.o. Male) Treating RN: Cornell Barman Primary Care Provider: PATIENT, NO Other Clinician: Referring Provider: Harvest Dark Treating Provider/Extender: Ricard Dillon Weeks in Treatment: 12 Constitutional Sitting or standing Blood Pressure is within target range for patient.. Pulse regular and within target range for patient.Marland Kitchen Respirations regular, non-labored and within target range.. Temperature is normal and within the target range for the patient.Marland Kitchen appears in no distress. Notes wound exam; really no major improvement although I don't think he has as much tenderness on the proximal foot just proximal to his toes on the left. He has large full-thickness skin loss between all of his toes on the left and the base of the toes on the left extending towards the metatarsals oOn the right the second third and fourth webspace have all denuded skin. This is worse than last week. oI don't think he has active secondary cellulitis Electronic Signature(s) Signed: 11/27/2017 5:42:24 PM By: Linton Ham MD Entered By: Linton Ham on 11/27/2017 17:36:58 Hall, [place]. ([id number]) -------------------------------------------------------------------------------- Physician Orders Details Patient Name: [name], [name] Date of Service: 11/27/2017 3:30 PM Medical Record Number: [id number] Patient Account Number: [id number] Date of  Birth/Sex: March 15, 1971 (47 y.o. Male) Treating RN: Cornell Barman Primary Care Provider: PATIENT, NO Other Clinician: Referring Provider: Harvest Dark Treating Provider/Extender: Tito Dine in Treatment: 12 Verbal / Phone Orders: No Diagnosis Coding Wound Cleansing Wound #1 Right Toe - Web between 4th and 5th o Cleanse wound with mild soap and water o May Shower, gently pat wound dry prior to applying new dressing. Wound #10 Right Toe - Web between 2nd and 3rd o Cleanse wound with mild soap and water o May Shower, gently pat wound dry prior to applying new dressing. Wound #11 Right,Plantar Foot o Cleanse wound with mild soap and water o May Shower, gently pat wound dry prior to applying new dressing. Wound #3 Left Toe - Web between 4th and 5th o Cleanse wound with  mild soap and water o May Shower, gently pat wound dry prior to applying new dressing. Wound #4 Left Toe - Web between 3rd and 4th o Cleanse wound with mild soap and water o May Shower, gently pat wound dry prior to applying new dressing. Wound #5 Right Toe - Web between 3rd and 4th o Cleanse wound with mild soap and water o May Shower, gently pat wound dry prior to applying new dressing. Wound #6 Left Toe - Web between 2nd and 3rd o Cleanse wound with mild soap and water o May Shower, gently pat wound dry prior to applying new dressing. Wound #7 Left,Plantar Foot o Cleanse wound with mild soap and water o May Shower, gently pat wound dry prior to applying new dressing. Wound #8 Left,Dorsal Foot o Cleanse wound with mild soap and water o May Shower, gently pat wound dry prior to applying new dressing. Wound #9 Left Toe - Web between 1st and 2nd o Cleanse wound with mild soap and water o May Shower, gently pat wound dry prior to applying new dressing. Primary Wound Dressing Wound #1 Right Toe - Web between 4th and 5th o Silvercel Non-Adherent Ramcharan, [name]  ([id number]) Wound #10 Right Toe - Web between 2nd and 3rd o Silvercel Non-Adherent Wound #11 Right,Plantar Foot o Silvercel Non-Adherent Wound #3 Left Toe - Web between 4th and 5th o Silvercel Non-Adherent Wound #4 Left Toe - Web between 3rd and 4th o Silvercel Non-Adherent Wound #5 Right Toe - Web between 3rd and 4th o Silvercel Non-Adherent Wound #6 Left Toe - Web between 2nd and 3rd o Silvercel Non-Adherent Wound #7 Left,Plantar Foot o Silvercel Non-Adherent Wound #8 Left,Dorsal Foot o Silvercel Non-Adherent Wound #9 Left Toe - Web between 1st and 2nd o Silvercel Non-Adherent Secondary Dressing Wound #1 Right Toe - Web between 4th and 5th o ABD and Kerlix/Conform Wound #10 Right Toe - Web between 2nd and 3rd o ABD and Kerlix/Conform Wound #11 Right,Plantar Foot o ABD and Kerlix/Conform Wound #3 Left Toe - Web between 4th and 5th o ABD and Kerlix/Conform Wound #4 Left Toe - Web between 3rd and 4th o ABD and Kerlix/Conform Wound #5 Right Toe - Web between 3rd and 4th o ABD and Kerlix/Conform Wound #6 Left Toe - Web between 2nd and 3rd o ABD and Kerlix/Conform Wound #7 Left,Plantar Foot o ABD and Kerlix/Conform Wound #8 Left,Dorsal Foot o ABD and Kerlix/Conform Wound #9 Left Toe - Web between 1st and 2nd o ABD and Kerlix/Conform Grider, [name] ([id number]) Dressing Change Frequency Wound #1 Right Toe - Web between 4th and 5th o Change dressing every day. Wound #10 Right Toe - Web between 2nd and 3rd o Change dressing every day. Wound #11 Right,Plantar Foot o Change dressing every day. Wound #3 Left Toe - Web between 4th and 5th o Change dressing every day. Wound #4 Left Toe - Web between 3rd and 4th o Change dressing every day. Wound #5 Right Toe - Web between 3rd and 4th o Change dressing every day. Wound #6 Left Toe - Web between 2nd and 3rd o Change dressing every day. Wound #7 Left,Plantar Foot o Change  dressing every day. Wound #8 Left,Dorsal Foot o Change dressing every day. Wound #9 Left Toe - Web between 1st and 2nd o Change dressing every day. Follow-up Appointments Wound #1 Right Toe - Web between 4th and 5th o Return Appointment in 2 weeks. Wound #10 Right Toe - Web between 2nd and 3rd o Return Appointment in  2 weeks. Wound #11 Right,Plantar Foot o Return Appointment in 2 weeks. Wound #3 Left Toe - Web between 4th and 5th o Return Appointment in 2 weeks. Wound #4 Left Toe - Web between 3rd and 4th o Return Appointment in 2 weeks. Wound #5 Right Toe - Web between 3rd and 4th o Return Appointment in 2 weeks. Wound #6 Left Toe - Web between 2nd and 3rd o Return Appointment in 2 weeks. Wound #7 Left,Plantar Foot o Return Appointment in 2 weeks. Mizzell, [name] ([id number]) Wound #8 Left,Dorsal Foot o Return Appointment in 2 weeks. Wound #9 Left Toe - Web between 1st and 2nd o Return Appointment in 2 weeks. Off-Loading Wound #1 Right Toe - Web between 4th and 5th o Other: - Keep pressure off of feet. Wound #10 Right Toe - Web between 2nd and 3rd o Other: - Keep pressure off of feet. Wound #11 Right,Plantar Foot o Other: - Keep pressure off of feet. Wound #3 Left Toe - Web between 4th and 5th o Other: - Keep pressure off of feet. Wound #4 Left Toe - Web between 3rd and 4th o Other: - Keep pressure off of feet. Wound #5 Right Toe - Web between 3rd and 4th o Other: - Keep pressure off of feet. Wound #6 Left Toe - Web between 2nd and 3rd o Other: - Keep pressure off of feet. Wound #7 Left,Plantar Foot o Other: - Keep pressure off of feet. Wound #8 Left,Dorsal Foot o Other: - Keep pressure off of feet. Wound #9 Left Toe - Web between 1st and 2nd o Other: - Keep pressure off of feet. Electronic Signature(s) Signed: 11/27/2017 5:38:59 PM By: Gretta Cool, BSN, RN, CWS, Kim RN, BSN Signed: 11/27/2017 5:42:24 PM By: Linton Ham  MD Entered By: Gretta Cool, BSN, RN, CWS, Kim on 11/27/2017 16:29:16 Kolbe, [name] ([id number]) -------------------------------------------------------------------------------- Problem List Details Patient Name: [name], Larson A. Date of Service: 11/27/2017 3:30 PM Medical Record Number: [id number] Patient Account Number: [id number] Date of Birth/Sex: 26-Aug-1971 (47 y.o. 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 ([id number]) -------------------------------------------------------------------------------- Progress Note Details Patient Name: [name], Shane A. Date of Service: 11/27/2017 3:30 PM Medical Record Number: [id number] Patient Account Number: [id number] Date of Birth/Sex: Feb 13, 1971 (47 y.o. Male) Treating RN: Cornell Barman Primary Care Provider: PATIENT, NO Other Clinician: Referring Provider: Harvest Dark Treating Provider/Extender: Ricard Dillon Weeks in Treatment: 12 Subjective History of Present Illness (HPI) 09/04/17; this is a 47 year old man who was not a diabetic and has no known history of peripheral vascular disease. He does have hyperlipidemia and a history of cocaine abuse although he insists that this is not active. He tells me that about a month ago he started having denuded skin between the toes of his left foot and on the plantar aspect of his left foot. He was picking at the skin and  pulling it off. More recently he has had involvement of the toes on the right foot. Week or 2 ago he started getting swelling and pain in the left foot and he  was seen in the ER on 08/29/17 he was diagnosed with cellulitis. The only ulcer that they commented on was the left fifth toe. An x-ray suggested ulcer at the fifth MTP slightly osteopenic appearance of the fifth met head question early osteo. He has 14 days of Bactrim DS prescribed and the patient states the swelling and pain in the left foot is improved since starting this 6 days ago. ABIs in this clinic were 1.28 on the right and 1.3 on the left. The patient is a smoker at one half pack per day 09/11/17; patient came last week and in general I think he had severe tinea pedis with secondary bacterial cellulitis. He was on Septra DS and is completed this. I gave him oral terbinafine for the tinea pedis. A culture I did on the left foot last week grew Pseudomonas which is quinolone sensitive. I'll give him 10 days of ciprofloxacin today. However he arrives with his foot looking a lot better. There is less erythema and less warmth 09/18/17; patient has completed this systemic antifungal therapy and antibiotics for secondary bacterial cellulitis. He is using Lotrimin between his toes and silver alginate. All of this looks a lot better 10/02/17;everything is cleaned up in the patient's bilateral feet except for the right fourth fifth toe web space. The patient had severe tinea pedis with secondary cellulitis. Multiple denuded areas between his toes. We have treated him successfully for all of this however he still has 1 remaining area 10/16/17; patient arrives after a 2 week hiatus he is back at work. Using Lotrimin cream and silver alginate between his toes. He tells me that in the last 2 days he's had denuded areas of skin that he is pulled off. There is no evidence of secondary infection or cellulitis. Not really a lot of evidence of tinea pedis  between the toes either. 10/23/17; the patient still has extensive skin breakdown between the bilateral fourth fifth, third fourth and even into the second third toe on the right is been using silver alginate. There is no evidence of secondary infection or cellulitis however this is a lot worse than a week ago 11/06/17; the patient arrives today with his left greater than right foot in not a very good condition. He has denuded skin from basically the base of the second to the fifth toe on the left with skin loss on all his toe webspaces in that distribution. More concerning once again he has erythema in the forefoot which I think is secondary cellulitis there is an odor and a greenish discoloration probably pseudomonas again. On the right foot he has loss of skin in the webspace between the fifth and fourth and the fourth and third toes. Although they state there is an odor here I didn't recognize it. He also brings up than when he first went to the ER just before he came here they did an x-ray that suggested the possibility of early osteomyelitis in the fifth toe on the left. He will need x-rays of this foot again. I will x-ray the right foot as well. He is not a diabetic and does not have PVD He is complaining of pain on the lateral part of his foot at the heel but he thinks this might be from walking on the side of his foot to avoid the discomfort of the left forefoot. He has a history of recurrent tinea pedis as teenager when he was in sports. The areas initially responded to oral antibiotics and oral antifungals.  Culture of this I did showed pseudomonas last time and the greenish discoloration certainly makes that a possibility. I gave him oral ciprofloxacin for 10 days 500 twice a day and terbinafine 250 a day for 2 weeks. He has been using topical ketoconazole for quite a period of time now and silver alginate which also should have antifungal properties. 11/13/17; the patient is not doing  well. The area on the right foot looks better however the area on the left does not. He is lost to needed skin on the plantar surface of the foot with loss of skin between all of his toes. There is weeping edema fluid. He complains of itching and burning. I gave him terbinafine last week along with ciprofloxacin to cover the Pseudomonas he cultured when he first came to this clinic however cultures showed pseudomonas and MRSA. X-ray of the foot did not show any osseous abnormalities. This weekend he moved and was up and down stairs in the foot which I'm sure didn't do this any Lacap, [name] ([id number]) any good however this is not doing well. When he first came to the clinic he cleaned up and about a month ago I sent him back to work he really appeared in the clinic 2 weeks later with his foot a lot worse. I had been using topical ketoconazole along with silver alginate which should be antifungal as well as drying however this is not work on this occasion not at least on the left foot. 11/20/17; no improvement. He continues to complain of itching and burning pain. The skin on the plantar aspect of his left foot is denuding off with a slightly green discoloration. Bacterial cultures I have done of this area show Pseudomonas. I had him on ciprofloxacin and doxycycline as well as oral terbinafine. Once again he is off work not able to function really because of this. 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 He says he is taking them all this week. I also gave him cefdinir 300 twice a day for 7 days for what I thought was coexistent Pseudomonas skin infection based on prior cultures. We have continued with  silver alginate and terbinafine cream. We have made absolutely no progress in this. The greenish discoloration and drainage on the left seems better but he has full thickness skin loss between all of his toes on the left extending towards the balls of his feet. The areas on the right are also worse. I continue to think this is ulcerative tinea pedis with secondary cellulitis. When I treated him for this when he first came in the clinic he rapidly improved in fact after 2 weeks I think he went back to work but since he has come back I have not been able to get this under control. I have asked for a podiatry consult although it was canceled last week because of insurance he has an appointment locally on the 19th. He apparently is also [facility] eligible at least he was at one point. He left the clinic today saying he was going to go to the emergency room at the [place] in [place] to see if he could get more rapid access to podiatry. If there is an alternative  diagnosis here told him I don't know what it is but I have really not been able to help him over the last several visits. He has also run out of medical insurance I think which has hampered the ability tability to get outside consultation Objective Constitutional Sitting or standing Blood Pressure is within target range for patient.. Pulse regular and within target range for patient.Marland Kitchen Respirations regular, non-labored and within target range.. Temperature is normal and within the target range for the patient.Marland Kitchen appears in no distress. Vitals Time Taken: 3:53 PM, Height: 71 in, Weight: 180 lbs, BMI: 25.1, Temperature: 98.3 F, Pulse: 85 bpm, Respiratory Rate: 18 breaths/min, Blood Pressure: 105/78 mmHg. General Notes: wound exam; really no major improvement although I don't think he has as much tenderness on the proximal foot just proximal to his toes on the left. He has large full-thickness skin loss between all of his toes on the left and the base of the  toes on the left extending towards the metatarsals On the right the second third and fourth webspace have all denuded skin. This is worse than last week. I don't think he has active secondary cellulitis Integumentary (Hair, Skin) Wound #1 status is Open. Original cause of wound was Gradually Appeared. The wound is located on the Right Toe - Web between 4th and 5th. The wound measures 4cm length x 2cm width x 0.1cm depth; 6.283cm^2 area and 0.628cm^3 volume. There is no tunneling or undermining noted. There is a large amount of serous drainage noted. Foul odor after cleansing was noted. The wound margin is flat and intact. There is large (67-100%) red granulation within the wound bed. There is a small (1-33%) amount of necrotic tissue within the wound bed including Adherent Slough. The periwound skin appearance exhibited: Excoriation, Induration, Maceration. The periwound skin appearance did not exhibit: Callus, Crepitus, Rash, Scarring, Dry/Scaly, Atrophie Blanche, Cyanosis, Ecchymosis, Hemosiderin Staining, Mottled, Pallor, Rubor, Erythema. Periwound temperature was noted as No Abnormality. The periwound has tenderness on palpation. Okimoto, [name] ([id number]) Wound #10 status is Open. Original cause of wound was Gradually Appeared. The wound is located on the Right Toe - Web between 2nd and 3rd. The wound measures 0.5cm length x 2cm width x 0.1cm depth; 0.785cm^2 area and 0.079cm^3 volume. There is no tunneling or undermining noted. There is a large amount of serosanguineous drainage noted. Foul odor after cleansing was noted. The wound margin is distinct with the outline attached to the wound base. There is large (67- 100%) red granulation within the wound bed. There is a small (1-33%) amount of necrotic tissue within the wound bed including Adherent Slough. The periwound skin appearance exhibited: Maceration, Erythema. The surrounding wound skin color is noted with erythema which is  circumferential. Periwound temperature was noted as No Abnormality. The periwound has tenderness on palpation. Wound #11 status is Open. Original cause of wound was Gradually Appeared. The wound is located on the [place]. The wound measures 1.5cm length x 3cm width x 0.1cm depth; 3.534cm^2 area and 0.353cm^3 volume. There is no tunneling or undermining noted. There is a large amount of serosanguineous drainage noted. Foul odor after cleansing was noted. The wound margin is distinct with the outline attached to the wound base. There is large (67-100%) red granulation within the wound bed. There is a small (1-33%) amount of necrotic tissue within the wound bed including Adherent Slough. The periwound skin appearance exhibited: Maceration, Erythema. The surrounding wound skin color is noted with erythema which is circumferential. Periwound  temperature was noted as No Abnormality. The periwound has tenderness on palpation. Wound #3 status is Open. Original cause of wound was Pressure Injury. The wound is located on the Left Toe - Web between 4th and 5th. The wound measures 5cm length x 2cm width x 0.1cm depth; 7.854cm^2 area and 0.785cm^3 volume. There is Fat Layer (Subcutaneous Tissue) Exposed exposed. There is no tunneling or undermining noted. There is a large amount of serosanguineous drainage noted. Foul odor after cleansing was noted. The wound margin is flat and intact. There is large (67-100%) red granulation within the wound bed. There is a small (1-33%) amount of necrotic tissue within the wound bed including Adherent Slough. The periwound skin appearance exhibited: Excoriation, Maceration. The periwound skin appearance did not exhibit: Callus, Crepitus, Induration, Rash, Scarring, Dry/Scaly, Atrophie Blanche, Cyanosis, Ecchymosis, Hemosiderin Staining, Mottled, Pallor, Rubor, Erythema. Periwound temperature was noted as No Abnormality. The periwound has tenderness on  palpation. Wound #4 status is Open. Original cause of wound was Gradually Appeared. The wound is located on the Left Toe - Web between 3rd and 4th. The wound measures 4cm length x 2cm width x 0.1cm depth; 6.283cm^2 area and 0.628cm^3 volume. There is no tunneling or undermining noted. There is a large amount of serosanguineous drainage noted. The wound margin is distinct with the outline attached to the wound base. There is large (67-100%) red granulation within the wound bed. There is a small (1-33%) amount of necrotic tissue within the wound bed including Adherent Slough. The periwound skin appearance exhibited: Excoriation, Maceration. The periwound skin appearance did not exhibit: Callus, Crepitus, Induration, Rash, Scarring, Dry/Scaly, Atrophie Blanche, Cyanosis, Ecchymosis, Hemosiderin Staining, Mottled, Pallor, Rubor, Erythema. Periwound temperature was noted as No Abnormality. The periwound has tenderness on palpation. Wound #5 status is Open. Original cause of wound was Gradually Appeared. The wound is located on the Right Toe - Web between 3rd and 4th. The wound measures 4cm length x 2cm width x 0.1cm depth; 6.283cm^2 area and 0.628cm^3 volume. There is no tunneling or undermining noted. There is a large amount of serosanguineous drainage noted. The wound margin is distinct with the outline attached to the wound base. There is large (67-100%) red granulation within the wound bed. There is a small (1-33%) amount of necrotic tissue within the wound bed including Adherent Slough. The periwound skin appearance exhibited: Excoriation, Maceration. The periwound skin appearance did not exhibit: Callus, Crepitus, Induration, Rash, Scarring, Dry/Scaly, Atrophie Blanche, Cyanosis, Ecchymosis, Hemosiderin Staining, Mottled, Pallor, Rubor, Erythema. Periwound temperature was noted as No Abnormality. The periwound has tenderness on palpation. Wound #6 status is Open. Original cause of wound was  Gradually Appeared. The wound is located on the Left Toe - Web between 2nd and 3rd. The wound measures 5.5cm length x 2cm width x 0.1cm depth; 8.639cm^2 area and 0.864cm^3 volume. There is no tunneling or undermining noted. There is a large amount of serosanguineous drainage noted. Foul odor after cleansing was noted. The wound margin is distinct with the outline attached to the wound base. There is large (67- 100%) red granulation within the wound bed. There is a small (1-33%) amount of necrotic tissue within the wound bed including Adherent Slough. The periwound skin appearance exhibited: Maceration. Periwound temperature was noted as No Abnormality. The periwound has tenderness on palpation. Wound #7 status is Open. Original cause of wound was Gradually Appeared. The wound is located on the [place]. The wound measures 4.5cm length x 8.5cm width x 0.1cm depth; 30.041cm^2 area and 3.004cm^3  volume. There is no tunneling or undermining noted. There is a large amount of serosanguineous drainage noted. Foul odor after cleansing was noted. The wound margin is distinct with the outline attached to the wound base. There is large (67-100%) red granulation within the wound bed. There is a small (1-33%) amount of necrotic tissue within the wound bed including Adherent Slough. The periwound skin appearance exhibited: Maceration. Periwound temperature was noted as No Abnormality. The periwound has tenderness on palpation. Whitcomb, [name] ([id number]) Wound #8 status is Open. Original cause of wound was Gradually Appeared. The wound is located on the Left,Dorsal Foot. The wound measures 7cm length x 7.5cm width x 0.1cm depth; 41.233cm^2 area and 4.123cm^3 volume. There is no tunneling or undermining noted. There is a large amount of serosanguineous drainage noted. Foul odor after cleansing was noted. The wound margin is distinct with the outline attached to the wound base. There is large (67-100%) red  granulation within the wound bed. There is no necrotic tissue within the wound bed. The periwound skin appearance exhibited: Maceration. Periwound temperature was noted as No Abnormality. The periwound has tenderness on palpation. Wound #9 status is Open. Original cause of wound was Gradually Appeared. The wound is located on the Left Toe - Web between 1st and 2nd. The wound measures 6cm length x 2cm width x 0.1cm depth; 9.425cm^2 area and 0.942cm^3 volume. There is no tunneling or undermining noted. There is a large amount of serosanguineous drainage noted. Foul odor after cleansing was noted. The wound margin is distinct with the outline attached to the wound base. There is large (67-100%) red granulation within the wound bed. There is a small (1-33%) amount of necrotic tissue within the wound bed including Adherent Slough. The periwound skin appearance exhibited: Maceration. Periwound temperature was noted as No Abnormality. The periwound has tenderness on palpation. Assessment Active Problems ICD-10 L97.522 - Non-pressure chronic ulcer of other part of left foot with fat layer exposed L03.116 - Cellulitis of left lower limb B35.3 - Tinea pedis Plan Wound Cleansing: Wound #1 Right Toe - Web between 4th and 5th: Cleanse wound with mild soap and water May Shower, gently pat wound dry prior to applying new dressing. Wound #10 Right Toe - Web between 2nd and 3rd: Cleanse wound with mild soap and water May Shower, gently pat wound dry prior to applying new dressing. Wound #11 Right,Plantar Foot: Cleanse wound with mild soap and water May Shower, gently pat wound dry prior to applying new dressing. Wound #3 Left Toe - Web between 4th and 5th: Cleanse wound with mild soap and water May Shower, gently pat wound dry prior to applying new dressing. Wound #4 Left Toe - Web between 3rd and 4th: Cleanse wound with mild soap and water May Shower, gently pat wound dry prior to applying new  dressing. Wound #5 Right Toe - Web between 3rd and 4th: Cleanse wound with mild soap and water May Shower, gently pat wound dry prior to applying new dressing. Wound #6 Left Toe - Web between 2nd and 3rd: Cleanse wound with mild soap and water May Shower, gently pat wound dry prior to applying new dressing. Wound #7 Left,Plantar Foot: Deyarmin, [name] ([id number]) Cleanse wound with mild soap and water May Shower, gently pat wound dry prior to applying new dressing. Wound #8 Left,Dorsal Foot: Cleanse wound with mild soap and water May Shower, gently pat wound dry prior to applying new dressing. Wound #9 Left Toe - Web between 1st and 2nd: Cleanse  wound with mild soap and water May Shower, gently pat wound dry prior to applying new dressing. Primary Wound Dressing: Wound #1 Right Toe - Web between 4th and 5th: [place] Non-Adherent Wound #10 Right Toe - Web between 2nd and 3rd: [place] Non-Adherent Wound #11 Right,Plantar Foot: [place] Non-Adherent Wound #3 Left Toe - Web between 4th and 5th: [place] Non-Adherent Wound #4 Left Toe - Web between 3rd and 4th: [place] Non-Adherent Wound #5 Right Toe - Web between 3rd and 4th: [place] Non-Adherent Wound #6 Left Toe - Web between 2nd and 3rd: [place] Non-Adherent Wound #7 Left,Plantar Foot: [place] Non-Adherent Wound #8 Left,Dorsal Foot: [place] Non-Adherent Wound #9 Left Toe - Web between 1st and 2nd: [place] Non-Adherent Secondary Dressing: Wound #1 Right Toe - Web between 4th and 5th: ABD and Kerlix/Conform Wound #10 Right Toe - Web between 2nd and 3rd: ABD and Kerlix/Conform Wound #11 Right,Plantar Foot: ABD and Kerlix/Conform Wound #3 Left Toe - Web between 4th and 5th: ABD and Kerlix/Conform Wound #4 Left Toe - Web between 3rd and 4th: ABD and Kerlix/Conform Wound #5 Right Toe - Web between 3rd and 4th: ABD and Kerlix/Conform Wound #6 Left Toe - Web between 2nd and 3rd: ABD and Kerlix/Conform Wound  #7 Left,Plantar Foot: ABD and Kerlix/Conform Wound #8 Left,Dorsal Foot: ABD and Kerlix/Conform Wound #9 Left Toe - Web between 1st and 2nd: ABD and Kerlix/Conform Dressing Change Frequency: Wound #1 Right Toe - Web between 4th and 5th: Change dressing every day. Wound #10 Right Toe - Web between 2nd and 3rd: Change dressing every day. Wound #11 Right,Plantar Foot: Change dressing every day. Wound #3 Left Toe - Web between 4th and 5th: Change dressing every day. Wound #4 Left Toe - Web between 3rd and 4th: W[place]ak, [name] ([id number]) Change dressing every day. Wound #5 Right Toe - Web between 3rd and 4th: Change dressing every day. Wound #6 Left Toe - Web between 2nd and 3rd: Change dressing every day. Wound #7 Left,Plantar Foot: Change dressing every day. Wound #8 Left,Dorsal Foot: Change dressing every day. Wound #9 Left Toe - Web between 1st and 2nd: Change dressing every day. Follow-up Appointments: Wound #1 Right Toe - Web between 4th and 5th: Return Appointment in 2 weeks. Wound #10 Right Toe - Web between 2nd and 3rd: Return Appointment in 2 weeks. Wound #11 Right,Plantar Foot: Return Appointment in 2 weeks. Wound #3 Left Toe - Web between 4th and 5th: Return Appointment in 2 weeks. Wound #4 Left Toe - Web between 3rd and 4th: Return Appointment in 2 weeks. Wound #5 Right Toe - Web between 3rd and 4th: Return Appointment in 2 weeks. Wound #6 Left Toe - Web between 2nd and 3rd: Return Appointment in 2 weeks. Wound #7 Left,Plantar Foot: Return Appointment in 2 weeks. Wound #8 Left,Dorsal Foot: Return Appointment in 2 weeks. Wound #9 Left Toe - Web between 1st and 2nd: Return Appointment in 2 weeks. Off-Loading: Wound #1 Right Toe - Web between 4th and 5th: Other: - Keep pressure off of feet. Wound #10 Right Toe - Web between 2nd and 3rd: Other: - Keep pressure off of feet. Wound #11 Right,Plantar Foot: Other: - Keep pressure off of feet. Wound #3 Left Toe -  Web between 4th and 5th: Other: - Keep pressure off of feet. Wound #4 Left Toe - Web between 3rd and 4th: Other: - Keep pressure off of feet. Wound #5 Right Toe - Web between 3rd and 4th: Other: - Keep pressure off of feet. Wound #6  Left Toe - Web between 2nd and 3rd: Other: - Keep pressure off of feet. Wound #7 Left,Plantar Foot: Other: - Keep pressure off of feet. Wound #8 Left,Dorsal Foot: Other: - Keep pressure off of feet. Wound #9 Left Toe - Web between 1st and 2nd: Other: - Keep pressure off of feet. 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 I think this secondary cellulitis part of this is better however I have not been able to get this under control. If there is an alternative diagnosis here I don't know what it is. We haven't been able to get a podiatry consult. The patient left here with a plan to go to the [place] emergency room and there are man and I encouraged him to do so I really am at a loss to know how to help him at this point. # 2I did have control of this after about 2-3 weeks with oral terbinafine, oral ciprofloxacin and topical ketoconazole with silver alginate. In fact he was able to go back to work the areas were healed however he came back with a rapid deterioration and I have not been able to get this under control since #3 I did give him a follow-up appointment here for 2 weeks however he can cancel this if he gets in at the [place]. I have not really been able to help him lately in any case Electronic Signature(s) Signed: 11/27/2017 5:42:24 PM By: Linton Ham MD Entered By: Linton Ham on 11/27/2017 17:38:52 [place], Adel. ([id number]) -------------------------------------------------------------------------------- SuperBill Details Patient Name: [name], [name] Date of Service: 11/27/2017 Medical Record Number: [id number] Patient Account Number: [id number] Date of Birth/Sex: December 02, 1970 (47 y.o. Male) Treating RN: Cornell Barman Primary Care Provider: PATIENT, NO Other Clinician: Referring Provider: Harvest Dark Treating Provider/Extender: Tito Dine in Treatment: 12 Diagnosis Coding ICD-10 Codes Code Description [id number] Non-pressure chronic ulcer of other part of left foot with fat layer exposed L03.116 Cellulitis of left lower limb B35.3 Tinea pedis Facility Procedures CPT4 Code: [id number] Description: 81103 - WOUND CARE VISIT-LEV 5 EST PT Modifier: Quantity: 1 Physician Procedures CPT4 Code: [id number] Description: 92924 - WC PHYS LEVEL 2 - EST PT ICD-10 Diagnosis Description B35.3 Tinea pedis L97.522 Non-pressure chronic ulcer of other part of left foot with fa Modifier: t layer exposed Quantity: 1 Electronic Signature(s) Signed: 11/27/2017 5:42:24 PM By: Linton Ham MD Entered By: Linton Ham on 11/27/2017 17:39:19

## 2017-11-29 NOTE — Progress Notes (Addendum)
Marc, [name] ([id number]) Visit Report for 11/27/2017 Arrival Information Details Patient Name: [name], [name] Date of Service: 11/27/2017 3:30 PM Medical Record Number: [id number] Patient Account Number: [id number] Date of Birth/Sex: [birth year] (47 y.o. M) Treating RN: Phillis Haggis Primary Care Melisha Eggleton: PATIENT, NO Other Clinician: Referring Dynver Clemson: Minna Antis Treating Kazuko Clemence/Extender: Altamese [place] in Treatment: 12 Visit Information History Since Last Visit All ordered tests and consults were completed: No Patient Arrived: Crutches Added or deleted any medications: No Arrival Time: 15:52 Any new allergies or adverse reactions: No Accompanied By: girlfriend Had a fall or experienced change in No Transfer Assistance: EasyPivot Patient activities of daily living that may affect Lift risk of falls: Patient Identification Verified: Yes Signs or symptoms of abuse/neglect since last visito No Secondary Verification Process Yes Hospitalized since last visit: No Completed: Has Dressing in Place as Prescribed: Yes Patient Requires Transmission-Based No Precautions: Pain Present Now: Yes Patient Has Alerts: No Electronic Signature(s) Signed: 11/27/2017 4:40:08 PM By: Alejandro Mulling Entered By: Alejandro Mulling on 11/27/2017 15:52:58 Muehl, Drew A. ([id number]) -------------------------------------------------------------------------------- Clinic Level of Care Assessment Details Patient Name: [name], Edward A. Date of Service: 11/27/2017 3:30 PM Medical Record Number: [id number] Patient Account Number: [id number] Date of Birth/Sex: 1971/05/06 (47 y.o. M) Treating RN: Huel Coventry Primary Care Micky Sheller: PATIENT, NO Other Clinician: Referring Jossalin Chervenak: Minna Antis Treating Dallis Darden/Extender: Altamese [place] in Treatment: 12 Clinic Level of Care Assessment Items TOOL 4 Quantity Score []  - Use when only an EandM is performed on FOLLOW-UP visit  0 ASSESSMENTS - Nursing Assessment / Reassessment []  - Reassessment of Co-morbidities (includes updates in patient status) 0 []  - 0 Reassessment of Adherence to Treatment Plan ASSESSMENTS - Wound and Skin Assessment / Reassessment []  - Simple Wound Assessment / Reassessment - one wound 0 X- 11 5 Complex Wound Assessment / Reassessment - multiple wounds []  - 0 Dermatologic / Skin Assessment (not related to wound area) ASSESSMENTS - Focused Assessment []  - Circumferential Edema Measurements - multi extremities 0 []  - 0 Nutritional Assessment / Counseling / Intervention []  - 0 Lower Extremity Assessment (monofilament, tuning fork, pulses) []  - 0 Peripheral Arterial Disease Assessment (using hand held doppler) ASSESSMENTS - Ostomy and/or Continence Assessment and Care []  - Incontinence Assessment and Management 0 []  - 0 Ostomy Care Assessment and Management (repouching, etc.) PROCESS - Coordination of Care X - Simple Patient / Family Education for ongoing care 1 15 []  - 0 Complex (extensive) Patient / Family Education for ongoing care []  - 0 Staff obtains Chiropractor, Records, Test Results / Process Orders []  - 0 Staff telephones HHA, Nursing Homes / Clarify orders / etc []  - 0 Routine Transfer to another Facility (non-emergent condition) []  - 0 Routine Hospital Admission (non-emergent condition) []  - 0 New Admissions / Manufacturing engineer / Ordering NPWT, Apligraf, etc. []  - 0 Emergency Hospital Admission (emergent condition) X- 1 10 Simple Discharge Coordination Myers, Vilas A. ([id number]) []  - 0 Complex (extensive) Discharge Coordination PROCESS - Special Needs []  - Pediatric / Minor Patient Management 0 []  - 0 Isolation Patient Management []  - 0 Hearing / Language / Visual special needs []  - 0 Assessment of Community assistance (transportation, D/C planning, etc.) []  - 0 Additional assistance / Altered mentation []  - 0 Support Surface(s) Assessment (bed,  cushion, seat, etc.) INTERVENTIONS - Wound Cleansing / Measurement []  - Simple Wound Cleansing - one wound 0 X- 11 5 Complex Wound Cleansing - multiple wounds X- 1 5 Wound Imaging (photographs - any  number of wounds) []  - 0 Wound Tracing (instead of photographs) []  - 0 Simple Wound Measurement - one wound X- 11 5 Complex Wound Measurement - multiple wounds INTERVENTIONS - Wound Dressings []  - Small Wound Dressing one or multiple wounds 0 []  - 0 Medium Wound Dressing one or multiple wounds X- 2 20 Large Wound Dressing one or multiple wounds []  - 0 Application of Medications - topical []  - 0 Application of Medications - injection INTERVENTIONS - Miscellaneous []  - External ear exam 0 []  - 0 Specimen Collection (cultures, biopsies, blood, body fluids, etc.) []  - 0 Specimen(s) / Culture(s) sent or taken to Lab for analysis []  - 0 Patient Transfer (multiple staff / Nurse, adult / Similar devices) []  - 0 Simple Staple / Suture removal (25 or less) []  - 0 Complex Staple / Suture removal (26 or more) []  - 0 Hypo / Hyperglycemic Management (close monitor of Blood Glucose) []  - 0 Ankle / Brachial Index (ABI) - do not check if billed separately X- 1 5 Vital Signs Drennen, Kavin A. ([id number]) Has the patient been seen at the hospital within the last three years: Yes Total Score: 240 Level Of Care: New/Established - Level 5 Electronic Signature(s) Signed: 11/27/2017 5:38:59 PM By: Elliot Gurney, BSN, RN, CWS, Kim RN, BSN Entered By: Elliot Gurney, BSN, RN, CWS, Kim on 11/27/2017 16:30:26 Glaude, Tramon A. ([id number]) -------------------------------------------------------------------------------- Encounter Discharge Information Details Patient Name: [name], [name] Date of Service: 11/27/2017 3:30 PM Medical Record Number: [id number] Patient Account Number: [id number] Date of Birth/Sex: [birth year] (47 y.o. M) Treating RN: Huel Coventry Primary Care Jayven Naill: PATIENT, NO Other Clinician: Referring  Valdis Bevill: Minna Antis Treating Oluwaseyi Raffel/Extender: Altamese [place] in Treatment: 12 Encounter Discharge Information Items Discharge Pain Level: 0 Discharge Condition: Stable Ambulatory Status: Ambulatory Discharge Destination: Home Transportation: Private Auto Accompanied By: girlfriend Schedule Follow-up Appointment: Yes Medication Reconciliation completed and No provided to Patient/Care Vaeda Westall: Provided on Clinical Summary of Care: 11/27/2017 Form Type Recipient Paper Patient DC Electronic Signature(s) Signed: 11/27/2017 5:19:26 PM By: Curtis Sites Entered By: Curtis Sites on 11/27/2017 17:03:56 Deleo, Trevion A. ([id number]) -------------------------------------------------------------------------------- Lower Extremity Assessment Details Patient Name: [name], Calvyn A. Date of Service: 11/27/2017 3:30 PM Medical Record Number: [id number] Patient Account Number: [id number] Date of Birth/Sex: 28-Aug-1971 (47 y.o. M) Treating RN: Phillis Haggis Primary Care Bernardette Waldron: PATIENT, NO Other Clinician: Referring Suheyb Raucci: Minna Antis Treating Verlene Glantz/Extender: Altamese [place] in Treatment: 12 Vascular Assessment Pulses: Dorsalis Pedis Palpable: [Left:Yes] [Right:Yes] Posterior Tibial Extremity colors, hair growth, and conditions: Extremity Color: [Left:Normal] [Right:Normal] Temperature of Extremity: [Left:Warm] [Right:Warm] Capillary Refill: [Left:< 3 seconds] [Right:< 3 seconds] Toe Nail Assessment Left: Right: Thick: Yes Yes Discolored: Yes Yes Deformed: No No Improper Length and Hygiene: No No Electronic Signature(s) Signed: 11/27/2017 4:40:08 PM By: Alejandro Mulling Entered By: Alejandro Mulling on 11/27/2017 16:11:56 Hartsell, Nation A. ([id number]) -------------------------------------------------------------------------------- Multi Wound Chart Details Patient Name: [name], Keyaan A. Date of Service: 11/27/2017 3:30 PM Medical Record Number:  [id number] Patient Account Number: [id number] Date of Birth/Sex: 1970/12/18 (46 y.o. M) Treating RN: Huel Coventry Primary Care Arin Peral: PATIENT, NO Other Clinician: Referring Kuba Shepherd: Minna Antis Treating Holly Pring/Extender: Altamese [place] in Treatment: 12 Vital Signs Height(in): 71 Pulse(bpm): 85 Weight(lbs): 180 Blood Pressure(mmHg): 105/78 Body Mass Index(BMI): 25 Temperature(F): 98.3 Respiratory Rate 18 (breaths/min): Photos: [1:No Photos] [10:No Photos] [11:No Photos] Wound Location: [1:Right Toe - Web between 4th and 5th] [10:Right Toe - Web between 2nd and 3rd] [11:Right Foot - Plantar] Wounding Event: [1:Gradually Appeared] [10:Gradually Appeared] [  11:Gradually Appeared] Primary Etiology: [1:Fungal] [10:Fungal] [11:Fungal] Date Acquired: [1:08/05/2017] [10:11/27/2017] [11:11/27/2017] Weeks of Treatment: [1:12] [10:0] [11:0] Wound Status: [1:Open] [10:Open] [11:Open] Pending Amputation on [1:Yes] [10:No] [11:No] Presentation: Measurements L x W x D [1:4x2x0.1] [10:0.5x2x0.1] [11:1.5x3x0.1] (cm) Area (cm) : [1:6.283] [10:0.785] [11:3.534] Volume (cm) : [1:0.628] [10:0.079] [11:0.353] % Reduction in Area: [1:-146.90%] [10:N/A] [11:N/A] % Reduction in Volume: [1:-147.20%] [10:N/A] [11:N/A] Classification: [1:Partial Thickness] [10:Full Thickness Without Exposed Support Structures] [11:Full Thickness Without Exposed Support Structures] Exudate Amount: [1:Large] [10:Large] [11:Large] Exudate Type: [1:Serous] [10:Serosanguineous] [11:Serosanguineous] Exudate Color: [1:amber] [10:red, brown] [11:red, brown] Foul Odor After Cleansing: [1:Yes] [10:Yes] [11:Yes] Odor Anticipated Due to [1:No] [10:No] [11:No] Product Use: Wound Margin: [1:Flat and Intact] [10:Distinct, outline attached] [11:Distinct, outline attached] Granulation Amount: [1:Large (67-100%)] [10:Large (67-100%)] [11:Large (67-100%)] Granulation Quality: [1:Red] [10:Red] [11:Red] Necrotic Amount:  [1:Small (1-33%)] [10:Small (1-33%)] [11:Small (1-33%)] Exposed Structures: [1:Fascia: No Fat Layer (Subcutaneous Tissue) Exposed: No Tendon: No Muscle: No Joint: No Bone: No] [10:Fascia: No Fat Layer (Subcutaneous Tissue) Exposed: No Tendon: No Muscle: No Joint: No Bone: No] [11:Fascia: No Fat Layer (Subcutaneous Tissue)  Exposed: No Tendon: No Muscle: No Joint: No Bone: No] Epithelialization: [1:None] [10:None] [11:None] Periwound Skin Texture: [10:No Abnormalities Noted] [11:No Abnormalities Noted] Excoriation: Yes Induration: Yes Callus: No Crepitus: No Rash: No Scarring: No Periwound Skin Moisture: Maceration: Yes Maceration: Yes Maceration: Yes Dry/Scaly: No Periwound Skin Color: Atrophie Blanche: No Erythema: Yes Erythema: Yes Cyanosis: No Ecchymosis: No Erythema: No Hemosiderin Staining: No Mottled: No Pallor: No Rubor: No Erythema Location: N/A Circumferential Circumferential Temperature: No Abnormality No Abnormality No Abnormality Tenderness on Palpation: Yes Yes Yes Wound Preparation: Ulcer Cleansing: Ulcer Cleansing: Ulcer Cleansing: Rinsed/Irrigated with Saline Rinsed/Irrigated with Saline Rinsed/Irrigated with Saline Topical Anesthetic Applied: Topical Anesthetic Applied: Topical Anesthetic Applied: None None None Wound Number: [id number]  Photos: No Photos No Photos No Photos Wound Location: Left Toe - Web between 4th Left Toe - Web between 3rd Right Toe - Web between 3rd and 5th and 4th and 4th Wounding Event: Pressure Injury Gradually Appeared Gradually Appeared Primary Etiology: Fungal Fungal Fungal Date Acquired: 10/13/2017 10/16/2017 10/16/2017 Weeks of Treatment: 6 5 5  Wound Status: Open Open Open Pending Amputation on No No No Presentation: Measurements L x W x D 5x2x0.1 4x2x0.1 4x2x0.1 (cm) Area (cm) : 7.854 6.283 6.283 Volume (cm) : 0.785 0.628 0.628 % Reduction in Area: -138.10% -33.30% -433.40% % Reduction in Volume: -137.90% -33.30%  -432.20% Classification: Full Thickness Without Partial Thickness Partial Thickness Exposed Support Structures Exudate Amount: Large Large Large Exudate Type: Serosanguineous Serosanguineous Serosanguineous Exudate Color: red, brown red, brown red, brown Foul Odor After Cleansing: Yes No No Odor Anticipated Due to No N/A N/A Product Use: Wound Margin: Flat and Intact Distinct, outline attached Distinct, outline attached Granulation Amount: Large (67-100%) Large (67-100%) Large (67-100%) Granulation Quality: Red Red Red Necrotic Amount: Small (1-33%) Small (1-33%) Small (1-33%) Exposed Structures: Fat Layer (Subcutaneous Fascia: No Fascia: No Tissue) Exposed: Yes Fat Layer (Subcutaneous Fat Layer (Subcutaneous Fascia: No Tissue) Exposed: No Tissue) Exposed: No Ottley, Stuart A. ([id number]) Tendon: No Tendon: No Tendon: No Muscle: No Muscle: No Muscle: No Joint: No Joint: No Joint: No Bone: No Bone: No Bone: No Epithelialization: None None None Periwound Skin Texture: Excoriation: Yes Excoriation: Yes Excoriation: Yes Induration: No Induration: No Induration: No Callus: No Callus: No Callus: No Crepitus: No Crepitus: No Crepitus: No Rash: No Rash: No Rash: No Scarring: No Scarring: No Scarring: No Periwound Skin Moisture: Maceration: Yes Maceration: Yes Maceration: Yes  Dry/Scaly: No Dry/Scaly: No Dry/Scaly: No Periwound Skin Color: Atrophie Blanche: No Atrophie Blanche: No Atrophie Blanche: No Cyanosis: No Cyanosis: No Cyanosis: No Ecchymosis: No Ecchymosis: No Ecchymosis: No Erythema: No Erythema: No Erythema: No Hemosiderin Staining: No Hemosiderin Staining: No Hemosiderin Staining: No Mottled: No Mottled: No Mottled: No Pallor: No Pallor: No Pallor: No Rubor: No Rubor: No Rubor: No Erythema Location: N/A N/A N/A Temperature: No Abnormality No Abnormality No Abnormality Tenderness on Palpation: Yes Yes Yes Wound Preparation: Ulcer  Cleansing: Ulcer Cleansing: Ulcer Cleansing: Rinsed/Irrigated with Saline Rinsed/Irrigated with Saline Rinsed/Irrigated with Saline Topical Anesthetic Applied: Topical Anesthetic Applied: Topical Anesthetic Applied: None None None Wound Number: [id number]  Photos: No Photos No Photos No Photos Wound Location: Left Toe - Web between 2nd Left Foot - Plantar Left Foot - Dorsal and 3rd Wounding Event: Gradually Appeared Gradually Appeared Gradually Appeared Primary Etiology: Fungal Fungal Fungal Date Acquired: 10/23/2017 10/23/2017 10/23/2017 Weeks of Treatment: 3 3 3  Wound Status: Open Open Open Pending Amputation on No No No Presentation: Measurements L x W x D 5.5x2x0.1 4.5x8.5x0.1 7x7.5x0.1 (cm) Area (cm) : 8.639 30.041 41.233 Volume (cm) : 0.864 3.004 4.123 % Reduction in Area: -76.00% -431.20% -20937.20% % Reduction in Volume: -76.00% -431.70% -20515.00% Classification: Full Thickness Without Full Thickness Without Full Thickness Without Exposed Support Structures Exposed Support Structures Exposed Support Structures Exudate Amount: Large Large Large Exudate Type: Serosanguineous Serosanguineous Serosanguineous Exudate Color: red, brown red, brown red, brown Foul Odor After Cleansing: Yes Yes Yes Odor Anticipated Due to No No No Product Use: Wound Margin: Distinct, outline attached Distinct, outline attached Distinct, outline attached Granulation Amount: Large (67-100%) Large (67-100%) Large (67-100%) Lofaro, Cordon A. ([id number]) Granulation Quality: Red Red Red Necrotic Amount: Small (1-33%) Small (1-33%) None Present (0%) Exposed Structures: Fascia: No Fascia: No Fascia: No Fat Layer (Subcutaneous Fat Layer (Subcutaneous Fat Layer (Subcutaneous Tissue) Exposed: No Tissue) Exposed: No Tissue) Exposed: No Tendon: No Tendon: No Tendon: No Muscle: No Muscle: No Muscle: No Joint: No Joint: No Joint: No Bone: No Bone: No Bone: No Epithelialization: None None  None Periwound Skin Texture: No Abnormalities Noted No Abnormalities Noted No Abnormalities Noted Periwound Skin Moisture: Maceration: Yes Maceration: Yes Maceration: Yes Periwound Skin Color: No Abnormalities Noted No Abnormalities Noted No Abnormalities Noted Erythema Location: N/A N/A N/A Temperature: No Abnormality No Abnormality No Abnormality Tenderness on Palpation: Yes Yes Yes Wound Preparation: Ulcer Cleansing: Ulcer Cleansing: Ulcer Cleansing: Rinsed/Irrigated with Saline Rinsed/Irrigated with Saline Rinsed/Irrigated with Saline Topical Anesthetic Applied: Topical Anesthetic Applied: Topical Anesthetic Applied: None None None Wound Number: 9 N/A N/A Photos: No Photos N/A N/A Wound Location: Left Toe - Web between 1st N/A N/A and 2nd Wounding Event: Gradually Appeared N/A N/A Primary Etiology: Fungal N/A N/A Date Acquired: 10/23/2017 N/A N/A Weeks of Treatment: 3 N/A N/A Wound Status: Open N/A N/A Pending Amputation on No N/A N/A Presentation: Measurements L x W x D 6x2x0.1 N/A N/A (cm) Area (cm) : 9.425 N/A N/A Volume (cm) : 0.942 N/A N/A % Reduction in Area: -700.10% N/A N/A % Reduction in Volume: -698.30% N/A N/A Classification: Full Thickness Without N/A N/A Exposed Support Structures Exudate Amount: Large N/A N/A Exudate Type: Serosanguineous N/A N/A Exudate Color: red, brown N/A N/A Foul Odor After Cleansing: Yes N/A N/A Odor Anticipated Due to No N/A N/A Product Use: Wound Margin: Distinct, outline attached N/A N/A Granulation Amount: Large (67-100%) N/A N/A Granulation Quality: Red N/A N/A Necrotic Amount: Small (1-33%) N/A N/A Exposed Structures: Fascia: No N/A  N/A Fat Layer (Subcutaneous Tissue) Exposed: No Tendon: No Muscle: No Clinkscale, Jaelynn A. ([id number]) Joint: No Bone: No Epithelialization: None N/A N/A Periwound Skin Texture: No Abnormalities Noted N/A N/A Periwound Skin Moisture: Maceration: Yes N/A N/A Periwound Skin Color: No  Abnormalities Noted N/A N/A Erythema Location: N/A N/A N/A Temperature: No Abnormality N/A N/A Tenderness on Palpation: Yes N/A N/A Wound Preparation: Ulcer Cleansing: N/A N/A Rinsed/Irrigated with Saline Topical Anesthetic Applied: None Treatment Notes Electronic Signature(s) Signed: 11/27/2017 5:38:59 PM By: Elliot Gurney, BSN, RN, CWS, Kim RN, BSN Entered By: Elliot Gurney, BSN, RN, CWS, Kim on 11/27/2017 16:24:47 Newsham, Seth A. ([id number]) -------------------------------------------------------------------------------- Multi-Disciplinary Care Plan Details Patient Name: [name], Bing A. Date of Service: 11/27/2017 3:30 PM Medical Record Number: [id number] Patient Account Number: [id number] Date of Birth/Sex: [birth year] (47 y.o. M) Treating RN: Huel Coventry Primary Care Angalina Ante: PATIENT, NO Other Clinician: Referring Tavon Magnussen: Minna Antis Treating Taeya Theall/Extender: Altamese [place] in Treatment: 12 Active Inactive Electronic Signature(s) Signed: 12/19/2017 4:10:31 PM By: Elliot Gurney, BSN, RN, CWS, Kim RN, BSN Previous Signature: 11/27/2017 5:38:59 PM Version By: Elliot Gurney, BSN, RN, CWS, Kim RN, BSN Entered By: Elliot Gurney, BSN, RN, CWS, Kim on 12/19/2017 16:10:29 Winther, Jayten A. ([id number]) -------------------------------------------------------------------------------- Pain Assessment Details Patient Name: [name], Garhett A. Date of Service: 11/27/2017 3:30 PM Medical Record Number: [id number] Patient Account Number: [id number] Date of Birth/Sex: 09-13-1971 (47 y.o. M) Treating RN: Phillis Haggis Primary Care Equilla Que: PATIENT, NO Other Clinician: Referring Kaiven Vester: Minna Antis Treating Jalexis Breed/Extender: Altamese [place] in Treatment: 12 Active Problems Location of Pain Severity and Description of Pain Patient Has Paino Yes Site Locations Pain Location: Pain in Ulcers Rate the pain. Current Pain Level: 6 Character of Pain Describe the Pain: Aching, Tender, Throbbing Pain Management  and Medication Current Pain Management: Notes Topical or injectable lidocaine is offered to patient for acute pain when surgical debridement is performed. If needed, Patient is instructed to use over the counter pain medication for the following 24-48 hours after debridement. Wound care MDs do not prescribed pain medications. Patient has chronic pain or uncontrolled pain. Patient has been instructed to make an appointment with their Primary Care Physician for pain management. Electronic Signature(s) Signed: 11/27/2017 4:40:08 PM By: Alejandro Mulling Entered By: Alejandro Mulling on 11/27/2017 15:53:24 Petillo, Garyn A. ([id number]) -------------------------------------------------------------------------------- Patient/Caregiver Education Details Patient Name: [name], Rossi A. Date of Service: 11/27/2017 3:30 PM Medical Record Number: [id number] Patient Account Number: [id number] Date of Birth/Gender: 1971/07/05 (47 y.o. M) Treating RN: Curtis Sites Primary Care Physician: PATIENT, NO Other Clinician: Referring Physician: Minna Antis Treating Physician/Extender: Altamese [place] in Treatment: 12 Education Assessment Education Provided To: Patient and Caregiver Education Topics Provided Wound/Skin Impairment: Handouts: Other: wound care as ordered Methods: Demonstration, Explain/Verbal Responses: State content correctly Electronic Signature(s) Signed: 11/27/2017 5:19:26 PM By: Curtis Sites Entered By: Curtis Sites on 11/27/2017 17:04:19 Dhaliwal, Aiden A. ([id number]) -------------------------------------------------------------------------------- Wound Assessment Details Patient Name: [name], Kordae A. Date of Service: 11/27/2017 3:30 PM Medical Record Number: [id number] Patient Account Number: [id number] Date of Birth/Sex: [birth year] (47 y.o. M) Treating RN: Phillis Haggis Primary Care Sola Margolis: PATIENT, NO Other Clinician: Referring Amerie Beaumont: Minna Antis Treating  Aengus Sauceda/Extender: Altamese [place] in Treatment: 12 Wound Status Wound Number: 1 Primary Etiology: Fungal Wound Location: Right Toe - Web between 4th and 5th Wound Status: Open Wounding Event: Gradually Appeared Date Acquired: 08/05/2017 Weeks Of Treatment: 12 Clustered Wound: No Pending Amputation On Presentation Photos Photo Uploaded By: Alejandro Mulling on 11/27/2017 17:04:41 Wound Measurements Length: (cm) 4 Width: (  cm) 2 Depth: (cm) 0.1 Area: (cm) 6.283 Volume: (cm) 0.628 % Reduction in Area: -146.9% % Reduction in Volume: -147.2% Epithelialization: None Tunneling: No Undermining: No Wound Description Classification: Partial Thickness Wound Margin: Flat and Intact Exudate Amount: Large Exudate Type: Serous Exudate Color: amber Foul Odor After Cleansing: Yes Due to Product Use: No Slough/Fibrino No Wound Bed Granulation Amount: Large (67-100%) Exposed Structure Granulation Quality: Red Fascia Exposed: No Necrotic Amount: Small (1-33%) Fat Layer (Subcutaneous Tissue) Exposed: No Necrotic Quality: Adherent Slough Tendon Exposed: No Muscle Exposed: No Joint Exposed: No Bone Exposed: No Periwound Skin Texture Schnelle, Alontae A. ([id number]) Texture Color No Abnormalities Noted: No No Abnormalities Noted: No Callus: No Atrophie Blanche: No Crepitus: No Cyanosis: No Excoriation: Yes Ecchymosis: No Induration: Yes Erythema: No Rash: No Hemosiderin Staining: No Scarring: No Mottled: No Pallor: No Moisture Rubor: No No Abnormalities Noted: No Dry / Scaly: No Temperature / Pain Maceration: Yes Temperature: No Abnormality Tenderness on Palpation: Yes Wound Preparation Ulcer Cleansing: Rinsed/Irrigated with Saline Topical Anesthetic Applied: None Electronic Signature(s) Signed: 11/27/2017 4:40:08 PM By: Alejandro Mulling Entered By: Alejandro Mulling on 11/27/2017 16:09:21 Mossberg, Awab A.  ([id number]) -------------------------------------------------------------------------------- Wound Assessment Details Patient Name: [name], Ever A. Date of Service: 11/27/2017 3:30 PM Medical Record Number: [id number] Patient Account Number: [id number] Date of Birth/Sex: 04/03/1971 (47 y.o. M) Treating RN: Phillis Haggis Primary Care Rindi Beechy: PATIENT, NO Other Clinician: Referring Levy Cedano: Minna Antis Treating Benecio Kluger/Extender: Altamese [place] in Treatment: 12 Wound Status Wound Number: 10 Primary Etiology: Fungal Wound Location: Right Toe - Web between 2nd and 3rd Wound Status: Open Wounding Event: Gradually Appeared Date Acquired: 11/27/2017 Weeks Of Treatment: 0 Clustered Wound: No Photos Photo Uploaded By: Alejandro Mulling on 11/27/2017 17:04:42 Wound Measurements Length: (cm) 0.5 Width: (cm) 2 Depth: (cm) 0.1 Area: (cm) 0.785 Volume: (cm) 0.079 % Reduction in Area: % Reduction in Volume: Epithelialization: None Tunneling: No Undermining: No Wound Description Full Thickness Without Exposed Support Classification: Structures Wound Margin: Distinct, outline attached Exudate Large Amount: Exudate Type: Serosanguineous Exudate Color: red, brown Foul Odor After Cleansing: Yes Due to Product Use: No Slough/Fibrino Yes Wound Bed Granulation Amount: Large (67-100%) Exposed Structure Granulation Quality: Red Fascia Exposed: No Necrotic Amount: Small (1-33%) Fat Layer (Subcutaneous Tissue) Exposed: No Necrotic Quality: Adherent Slough Tendon Exposed: No Muscle Exposed: No Joint Exposed: No Bone Exposed: No Pica, Trentan A. ([id number]) Periwound Skin Texture Texture Color No Abnormalities Noted: No No Abnormalities Noted: No Erythema: Yes Moisture Erythema Location: Circumferential No Abnormalities Noted: No Maceration: Yes Temperature / Pain Temperature: No Abnormality Tenderness on Palpation: Yes Wound Preparation Ulcer Cleansing:  Rinsed/Irrigated with Saline Topical Anesthetic Applied: None Electronic Signature(s) Signed: 11/27/2017 4:40:08 PM By: Alejandro Mulling Entered By: Alejandro Mulling on 11/27/2017 16:08:01 Zaccaro, Deng A. ([id number]) -------------------------------------------------------------------------------- Wound Assessment Details Patient Name: [name], Elizer A. Date of Service: 11/27/2017 3:30 PM Medical Record Number: [id number] Patient Account Number: [id number] Date of Birth/Sex: 26-Jan-1971 (47 y.o. 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 Wound Status: Open Wounding Event: Gradually Appeared Date Acquired: 11/27/2017 Weeks Of Treatment: 0 Clustered Wound: No Photos Photo Uploaded By: Alejandro Mulling on 11/27/2017 17:06:18 Wound Measurements Length: (cm) 1.5 Width: (cm) 3 Depth: (cm) 0.1 Area: (cm) 3.534 Volume: (cm) 0.353 % Reduction in Area: % Reduction in Volume: Epithelialization: None Tunneling: No Undermining: No Wound  Description Full Thickness Without Exposed Support Classification: Structures Wound Margin: Distinct, outline attached Exudate Large Amount: Exudate Type: Serosanguineous Exudate Color: red, brown Foul Odor After Cleansing: Yes Due to Product Use: No Slough/Fibrino Yes Wound Bed Granulation Amount: Large (67-100%) Exposed Structure Granulation Quality: Red Fascia Exposed: No Necrotic Amount: Small (1-33%) Fat Layer (Subcutaneous Tissue) Exposed: No Necrotic Quality: Adherent Slough Tendon Exposed: No Muscle Exposed: No Joint Exposed: No Bone Exposed: No Pantoja, Shariff A. ([id number]) Periwound Skin Texture Texture Color No Abnormalities Noted: No No Abnormalities Noted: No Erythema:  Yes Moisture Erythema Location: Circumferential No Abnormalities Noted: No Maceration: Yes Temperature / Pain Temperature: No Abnormality Tenderness on Palpation: Yes Wound Preparation Ulcer Cleansing: Rinsed/Irrigated with Saline Topical Anesthetic Applied: None Electronic Signature(s) Signed: 11/27/2017 4:40:08 PM By: Alejandro MullingPinkerton, Debra Entered By: Alejandro MullingPinkerton, Debra on 11/27/2017 16:09:08 Jolley, Verdun A. ([id number]) -------------------------------------------------------------------------------- Wound Assessment Details Patient Name: Marc DragonARR, Amaru A. Date of Service: 11/27/2017 3:30 PM Medical Record Number: [id number] Patient Account Number: [id number] Date of Birth/Sex: 11/17/1970 (47 y.o. M) Treating RN: Phillis HaggisPinkerton, Debi Primary Care Florice Hindle: PATIENT, NO Other Clinician: Referring Nickia Boesen: Minna AntisPADUCHOWSKI, KEVIN Treating Sederick Jacobsen/Extender: Altamese CarolinaOBSON, MICHAEL G Weeks in Treatment: 12 Wound Status Wound Number: 3 Primary Etiology: Fungal Wound Location: Left Toe - Web between 4th and 5th Wound Status: Open Wounding Event: Pressure Injury Date Acquired: 10/13/2017 Weeks Of Treatment: 6 Clustered Wound: No Wound Measurements Length: (cm) 5 Width: (cm) 2 Depth: (cm) 0.1 Area: (cm) 7.854 Volume: (cm) 0.785 % Reduction in Area: -138.1% % Reduction in Volume: -137.9% Epithelialization: None Tunneling: No Undermining: No Wound Description Full Thickness Without Exposed Support Classification: Structures Wound Margin: Flat and Intact Exudate Large Amount: Exudate Type: Serosanguineous Exudate Color: red, brown Foul Odor After Cleansing: Yes Due to Product Use: No Slough/Fibrino Yes Wound Bed Granulation Amount: Large (67-100%) Exposed Structure Granulation Quality: Red Fascia Exposed: No Necrotic Amount: Small (1-33%) Fat Layer (Subcutaneous Tissue) Exposed: Yes Necrotic Quality: Adherent Slough Tendon Exposed: No Muscle Exposed: No Joint Exposed: No Bone Exposed:  No Periwound Skin Texture Texture Color No Abnormalities Noted: No No Abnormalities Noted: No Callus: No Atrophie Blanche: No Crepitus: No Cyanosis: No Excoriation: Yes Ecchymosis: No Induration: No Erythema: No Rash: No Hemosiderin Staining: No Scarring: No Mottled: No Pallor: No Moisture Rubor: No No Abnormalities Noted: No Dry / Scaly: No Temperature / Pain Channing, Calob A. ([id number]) Maceration: Yes Temperature: No Abnormality Tenderness on Palpation: Yes Wound Preparation Ulcer Cleansing: Rinsed/Irrigated with Saline Topical Anesthetic Applied: None Electronic Signature(s) Signed: 11/27/2017 4:40:08 PM By: Alejandro MullingPinkerton, Debra Entered By: Alejandro MullingPinkerton, Debra on 11/27/2017 16:09:35 Vizcarrondo, Geofrey A. ([id number]) -------------------------------------------------------------------------------- Wound Assessment Details Patient Name: Marc DragonARR, Labaron A. Date of Service: 11/27/2017 3:30 PM Medical Record Number: [id number] Patient Account Number: [id number] Date of Birth/Sex: 12/30/1970 (47 y.o. M) Treating RN: Phillis HaggisPinkerton, Debi Primary Care Elner Seifert: PATIENT, NO Other Clinician: Referring Caress Reffitt: Minna AntisPADUCHOWSKI, KEVIN Treating Eryca Bolte/Extender: Altamese CarolinaOBSON, MICHAEL G Weeks in Treatment: 12 Wound Status Wound Number: 4 Primary Etiology: Fungal Wound Location: Left Toe - Web between 3rd and 4th Wound Status: Open Wounding Event: Gradually Appeared Date Acquired: 10/16/2017 Weeks Of Treatment: 5 Clustered Wound: No Photos Photo Uploaded By: Alejandro MullingPinkerton, Debra on 11/27/2017 17:06:18 Wound Measurements Length: (cm) 4 Width: (cm) 2 Depth: (cm) 0.1 Area: (cm) 6.283 Volume: (cm) 0.628 % Reduction in Area: -33.3% % Reduction in Volume: -33.3% Epithelialization: None Tunneling: No Undermining: No Wound Description Classification: Partial Thickness Wound Margin: Distinct, outline attached Exudate Amount: Large Exudate Type: Serosanguineous Exudate Color: red, brown Foul Odor After  Cleansing:  No Slough/Fibrino Yes Wound Bed Granulation Amount: Large (67-100%) Exposed Structure Granulation Quality: Red Fascia Exposed: No Necrotic Amount: Small (1-33%) Fat Layer (Subcutaneous Tissue) Exposed: No Necrotic Quality: Adherent Slough Tendon Exposed: No Muscle Exposed: No Joint Exposed: No Bone Exposed: No Periwound Skin Texture Haas, Iori A. ([id number]) Texture Color No Abnormalities Noted: No No Abnormalities Noted: No Callus: No Atrophie Blanche: No Crepitus: No Cyanosis: No Excoriation: Yes Ecchymosis: No Induration: No Erythema: No Rash: No Hemosiderin Staining: No Scarring: No Mottled: No Pallor: No Moisture Rubor: No No Abnormalities Noted: No Dry / Scaly: No Temperature / Pain Maceration: Yes Temperature: No Abnormality Tenderness on Palpation: Yes Wound Preparation Ulcer Cleansing: Rinsed/Irrigated with Saline Topical Anesthetic Applied: None Electronic Signature(s) Signed: 11/27/2017 4:40:08 PM By: Alejandro Mulling Entered By: Alejandro Mulling on 11/27/2017 16:10:00 Kuklinski, Allyn A. ([id number]) -------------------------------------------------------------------------------- Wound Assessment Details Patient Name: [name], Adonis A. Date of Service: 11/27/2017 3:30 PM Medical Record Number: [id number] Patient Account Number: [id number] Date of Birth/Sex: 1971/07/06 (47 y.o. M) Treating RN: Phillis Haggis Primary Care Edyn Qazi: PATIENT, NO Other Clinician: Referring Latanga Nedrow: Minna Antis Treating Cailyn Houdek/Extender: Altamese [place] in Treatment: 12 Wound Status Wound Number: 5 Primary Etiology: Fungal Wound Location: Right Toe - Web between 3rd and 4th Wound Status: Open Wounding Event: Gradually Appeared Date Acquired: 10/16/2017 Weeks Of Treatment: 5 Clustered Wound: No Photos Photo Uploaded By: Alejandro Mulling on 11/27/2017 17:06:57 Wound Measurements Length: (cm) 4 Width: (cm) 2 Depth: (cm) 0.1 Area: (cm)  6.283 Volume: (cm) 0.628 % Reduction in Area: -433.4% % Reduction in Volume: -432.2% Epithelialization: None Tunneling: No Undermining: No Wound Description Classification: Partial Thickness Wound Margin: Distinct, outline attached Exudate Amount: Large Exudate Type: Serosanguineous Exudate Color: red, brown Foul Odor After Cleansing: No Slough/Fibrino Yes Wound Bed Granulation Amount: Large (67-100%) Exposed Structure Granulation Quality: Red Fascia Exposed: No Necrotic Amount: Small (1-33%) Fat Layer (Subcutaneous Tissue) Exposed: No Necrotic Quality: Adherent Slough Tendon Exposed: No Muscle Exposed: No Joint Exposed: No Bone Exposed: No Periwound Skin Texture Van, Antoin A. ([id number]) Texture Color No Abnormalities Noted: No No Abnormalities Noted: No Callus: No Atrophie Blanche: No Crepitus: No Cyanosis: No Excoriation: Yes Ecchymosis: No Induration: No Erythema: No Rash: No Hemosiderin Staining: No Scarring: No Mottled: No Pallor: No Moisture Rubor: No No Abnormalities Noted: No Dry / Scaly: No Temperature / Pain Maceration: Yes Temperature: No Abnormality Tenderness on Palpation: Yes Wound Preparation Ulcer Cleansing: Rinsed/Irrigated with Saline Topical Anesthetic Applied: None Electronic Signature(s) Signed: 11/27/2017 4:40:08 PM By: Alejandro Mulling Entered By: Alejandro Mulling on 11/27/2017 16:10:13 Hagins, Alexanderjames A. ([id number]) -------------------------------------------------------------------------------- Wound Assessment Details Patient Name: [name], Kordae A. Date of Service: 11/27/2017 3:30 PM Medical Record Number: [id number] Patient Account Number: [id number] Date of Birth/Sex: 30-Jun-1971 (47 y.o. M) Treating RN: Phillis Haggis Primary Care Arlene Genova: PATIENT, NO Other Clinician: Referring Fumiko Cham: Minna Antis Treating Jake Goodson/Extender: Altamese [place] in Treatment: 12 Wound Status Wound Number: 6 Primary Etiology:  Fungal Wound Location: Left Toe - Web between 2nd and 3rd Wound Status: Open Wounding Event: Gradually Appeared Date Acquired: 10/23/2017 Weeks Of Treatment: 3 Clustered Wound: No Photos Photo Uploaded By: Alejandro Mulling on 11/27/2017 17:06:58 Wound Measurements Length: (cm) 5.5 Width: (cm) 2 Depth: (cm) 0.1 Area: (cm) 8.639 Volume: (cm) 0.864 % Reduction in Area: -76% % Reduction in Volume: -76% Epithelialization: None Tunneling: No Undermining: No Wound Description Full Thickness Without Exposed Support Classification: Structures Wound Margin: Distinct, outline attached Exudate Large Amount: Exudate Type: Serosanguineous Exudate Color: red, brown Foul Odor After Cleansing:  Yes Due to Product Use: No Slough/Fibrino Yes Wound Bed Granulation Amount: Large (67-100%) Exposed Structure Granulation Quality: Red Fascia Exposed: No Necrotic Amount: Small (1-33%) Fat Layer (Subcutaneous Tissue) Exposed: No Necrotic Quality: Adherent Slough Tendon Exposed: No Muscle Exposed: No Joint Exposed: No Bone Exposed: No Kantz, Kiyoshi A. ([id number]) Periwound Skin Texture Texture Color No Abnormalities Noted: No No Abnormalities Noted: No Moisture Temperature / Pain No Abnormalities Noted: No Temperature: No Abnormality Maceration: Yes Tenderness on Palpation: Yes Wound Preparation Ulcer Cleansing: Rinsed/Irrigated with Saline Topical Anesthetic Applied: None Electronic Signature(s) Signed: 11/27/2017 4:40:08 PM By: Alejandro Mulling Entered By: Alejandro Mulling on 11/27/2017 16:10:40 Bergthold, Joash A. ([id number]) -------------------------------------------------------------------------------- Wound Assessment Details Patient Name: [name], Tadhg A. Date of Service: 11/27/2017 3:30 PM Medical Record Number: [id number] Patient Account Number: [id number] Date of Birth/Sex: [birth year] (47 y.o. 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 Wound Status: Open Wounding Event: Gradually Appeared Date Acquired: 10/23/2017 Weeks Of Treatment: 3 Clustered Wound: No Photos Photo Uploaded By: Alejandro Mulling on 11/27/2017 17:08:19 Wound Measurements Length: (cm) 4.5 Width: (cm) 8.5 Depth: (cm) 0.1 Area: (cm) 30.041 Volume: (cm) 3.004 % Reduction in Area: -431.2% % Reduction in Volume: -431.7% Epithelialization: None Tunneling: No Undermining: No Wound Description Full Thickness Without Exposed Support Classification: Structures Wound Margin: Distinct, outline attached Exudate Large Amount: Exudate Type: Serosanguineous Exudate Color: red, brown Foul Odor After Cleansing: Yes Due to Product Use: No Slough/Fibrino Yes Wound Bed Granulation Amount: Large (67-100%) Exposed Structure Granulation Quality: Red Fascia Exposed: No Necrotic Amount: Small (1-33%) Fat Layer (Subcutaneous Tissue) Exposed: No Necrotic Quality: Adherent Slough Tendon Exposed: No Muscle Exposed: No Joint Exposed: No Bone Exposed: No Mccammon, Osmani A. ([id number]) Periwound Skin Texture Texture Color No Abnormalities Noted: No No Abnormalities Noted: No Moisture Temperature / Pain No Abnormalities Noted: No Temperature: No Abnormality Maceration: Yes Tenderness on Palpation: Yes Wound Preparation Ulcer Cleansing: Rinsed/Irrigated with Saline Topical Anesthetic Applied: None Electronic Signature(s) Signed: 11/27/2017 4:40:08 PM By: Alejandro Mulling Entered By: Alejandro Mulling on 11/27/2017 16:10:56 Vannice, Draeden A. ([id number]) -------------------------------------------------------------------------------- Wound Assessment Details Patient Name: [name], Waldo A. Date of Service: 11/27/2017 3:30 PM Medical Record Number:  [id number] Patient Account Number: [id number] Date of Birth/Sex: [birth year] (47 y.o. M) Treating RN: Phillis Haggis Primary Care Emonie Espericueta: PATIENT, NO Other Clinician: Referring Mahlia Fernando: Minna Antis Treating Bryla Burek/Extender: Altamese [place] in Treatment: 12 Wound Status Wound Number: 8 Primary Etiology: Fungal Wound Location: Left Foot - Dorsal Wound Status: Open Wounding Event: Gradually Appeared Date Acquired: 10/23/2017 Weeks Of Treatment: 3 Clustered Wound: No Photos Photo Uploaded By: Alejandro Mulling on 11/27/2017 17:07:30 Wound Measurements Length: (cm) 7 Width: (cm) 7.5 Depth: (cm) 0.1 Area: (cm) 41.233 Volume: (cm) 4.123 % Reduction in Area: -20937.2% % Reduction in Volume: -20515% Epithelialization: None Tunneling: No Undermining: No Wound Description Full Thickness Without Exposed Support Classification: Structures Wound Margin: Distinct, outline attached Exudate Large Amount: Exudate Type: Serosanguineous Exudate Color: red, brown Foul Odor After Cleansing: Yes Due to Product Use: No Slough/Fibrino Yes Wound Bed Granulation Amount: Large (67-100%) Exposed Structure Granulation Quality: Red Fascia Exposed: No Necrotic Amount: None Present (0%) Fat Layer (Subcutaneous Tissue) Exposed: No Tendon Exposed: No Muscle Exposed: No Joint Exposed: No Bone Exposed: No Mossberg, Neamiah A. ([id number]) Periwound Skin Texture Texture Color No Abnormalities Noted: No No Abnormalities Noted: No  Moisture Temperature / Pain No Abnormalities Noted: No Temperature: No Abnormality Maceration: Yes Tenderness on Palpation: Yes Wound Preparation Ulcer Cleansing: Rinsed/Irrigated with Saline Topical Anesthetic Applied: None Electronic Signature(s) Signed: 11/27/2017 4:40:08 PM By: Alejandro Mulling Entered By: Alejandro Mulling on 11/27/2017 16:11:11 Cassarino, Blake A.  ([id number]) -------------------------------------------------------------------------------- Wound Assessment Details Patient Name: [name], July A. Date of Service: 11/27/2017 3:30 PM Medical Record Number: [id number] Patient Account Number: [id number] Date of Birth/Sex: [birth year] (47 y.o. M) Treating RN: Phillis Haggis Primary Care Maraya Gwilliam: PATIENT, NO Other Clinician: Referring Eleanora Guinyard: Minna Antis Treating Yohan Samons/Extender: Altamese [place] in Treatment: 12 Wound Status Wound Number: 9 Primary Etiology: Fungal Wound Location: Left Toe - Web between 1st and 2nd Wound Status: Open Wounding Event: Gradually Appeared Date Acquired: 10/23/2017 Weeks Of Treatment: 3 Clustered Wound: No Photos Photo Uploaded By: Alejandro Mulling on 11/27/2017 17:07:31 Wound Measurements Length: (cm) 6 Width: (cm) 2 Depth: (cm) 0.1 Area: (cm) 9.425 Volume: (cm) 0.942 % Reduction in Area: -700.1% % Reduction in Volume: -698.3% Epithelialization: None Tunneling: No Undermining: No Wound Description Full Thickness Without Exposed Support Classification: Structures Wound Margin: Distinct, outline attached Exudate Large Amount: Exudate Type: Serosanguineous Exudate Color: red, brown Foul Odor After Cleansing: Yes Due to Product Use: No Slough/Fibrino Yes Wound Bed Granulation Amount: Large (67-100%) Exposed Structure Granulation Quality: Red Fascia Exposed: No Necrotic Amount: Small (1-33%) Fat Layer (Subcutaneous Tissue) Exposed: No Necrotic Quality: Adherent Slough Tendon Exposed: No Muscle Exposed: No Joint Exposed: No Bone Exposed: No Myer, Ulyess A. ([id number]) Periwound Skin Texture Texture Color No Abnormalities Noted: No No Abnormalities Noted: No Moisture Temperature / Pain No Abnormalities Noted: No Temperature: No Abnormality Maceration: Yes Tenderness on Palpation: Yes Wound Preparation Ulcer Cleansing: Rinsed/Irrigated with Saline Topical  Anesthetic Applied: None Electronic Signature(s) Signed: 11/27/2017 4:40:08 PM By: Alejandro Mulling Entered By: Alejandro Mulling on 11/27/2017 16:11:24 Brannen, Ulysees A. ([id number]) -------------------------------------------------------------------------------- Vitals Details Patient Name: [name], Hosey A. Date of Service: 11/27/2017 3:30 PM Medical Record Number: [id number] Patient Account Number: [id number] Date of Birth/Sex: 12/26/1970 (47 y.o. M) Treating RN: Phillis Haggis Primary Care Zacarias Krauter: PATIENT, NO Other Clinician: Referring Raquel Racey: Minna Antis Treating Mitsue Peery/Extender: Altamese [place] in Treatment: 12 Vital Signs Time Taken: 15:53 Temperature (F): 98.3 Height (in): 71 Pulse (bpm): 85 Weight (lbs): 180 Respiratory Rate (breaths/min): 18 Body Mass Index (BMI): 25.1 Blood Pressure (mmHg): 105/78 Reference Range: 80 - 120 mg / dl Electronic Signature(s) Signed: 11/27/2017 4:40:08 PM By: Alejandro Mulling Entered By: Alejandro Mulling on 11/27/2017 15:53:45

## 2017-12-11 ENCOUNTER — Ambulatory Visit: Payer: No Typology Code available for payment source | Admitting: Internal Medicine

## 2019-07-03 IMAGING — CR DG FOOT COMPLETE 3+V*L*
1 series · 3 of 3 positions shown · non-contrast
Comparison: None.

CLINICAL DATA: Left foot pain with skin peeling for several weeks.

EXAM:
LEFT FOOT - COMPLETE 3+ VIEW

[Series 1: dg foot complete left · 0.14mm/px · 3 of 3 slices shown]
[im 1/3]
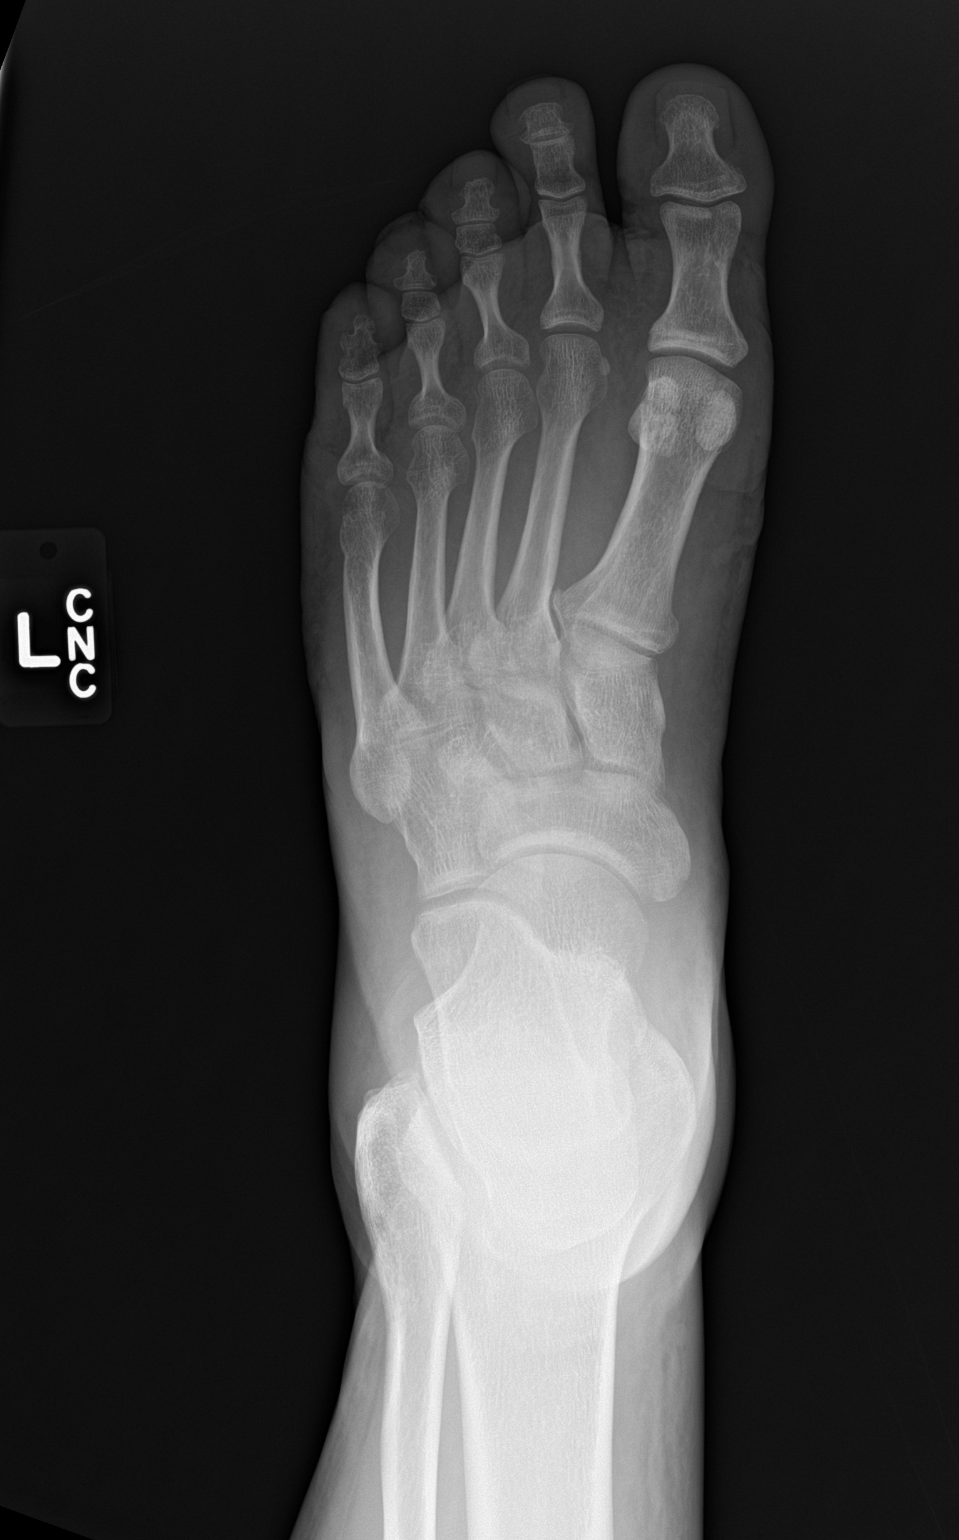
[im 2/3]
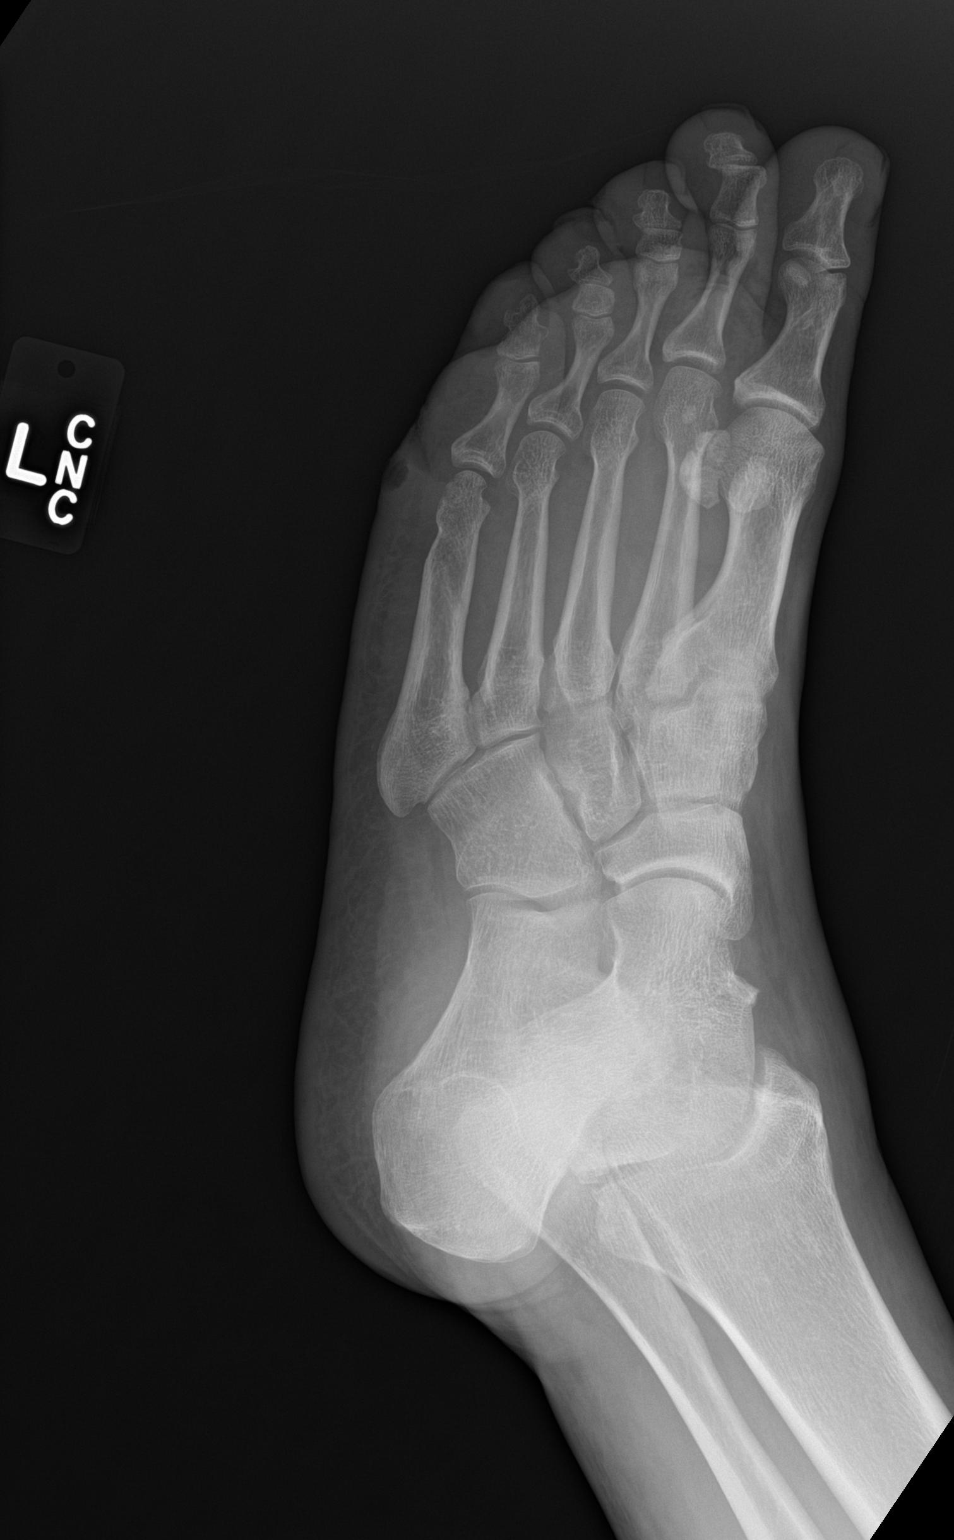
[im 3/3]
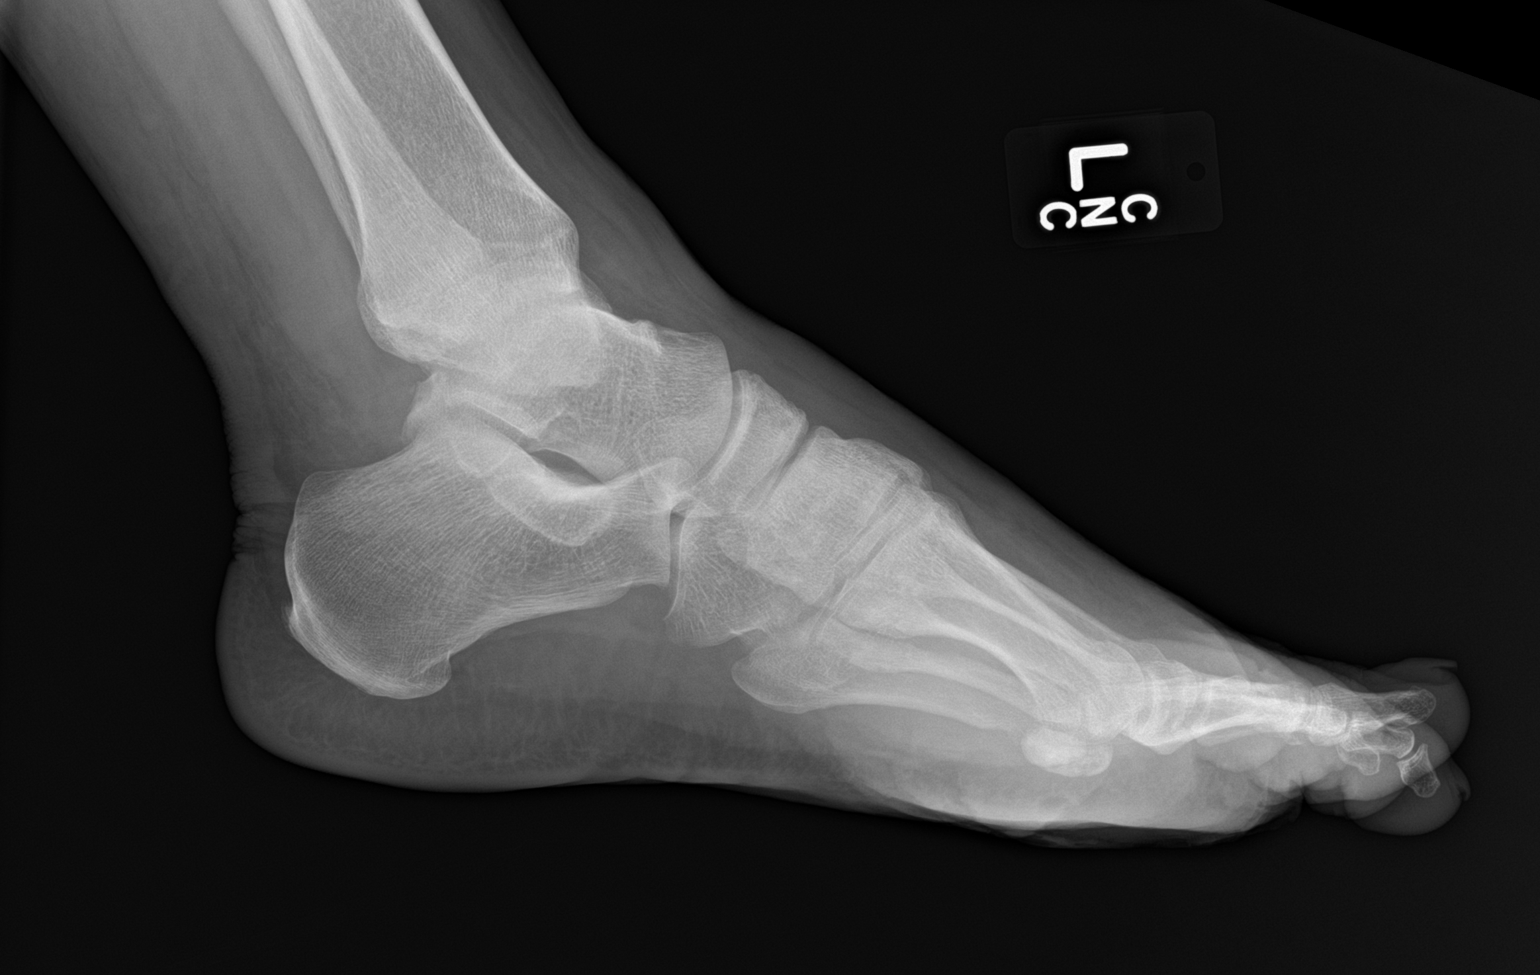

[3 of 3 positions shown; findings below may reference images not displayed]

FINDINGS: There is a soft tissue ulcer along the lateral aspect of the
forefoot at the level of the left fifth MTP joint. Subtle osteopenia
of the fifth metatarsal head with slightly indistinct lateral
cortical margins of the fifth metatarsal head raises the possibility
of osteomyelitis. No acute fracture joint dislocation.
IMPRESSION: 1. Soft tissue ulcer along the plantar lateral aspect of the
forefoot at the level of the fifth MTP.
2. The head of the adjacent fifth metatarsal is slightly osteopenic
and there is a slightly indistinct appearance of the fifth
metatarsal head laterally raising concern for changes of early
osteomyelitis.

## 2019-09-10 IMAGING — CR DG FOOT COMPLETE 3+V*R*
1 series · 3 of 3 positions shown · non-contrast
Comparison: None.

CLINICAL DATA: Open wound to the feet

EXAM:
RIGHT FOOT COMPLETE - 3+ VIEW

[Series 1: dg foot complete right · 0.14mm/px · 3 of 3 slices shown]
[im 1/3]
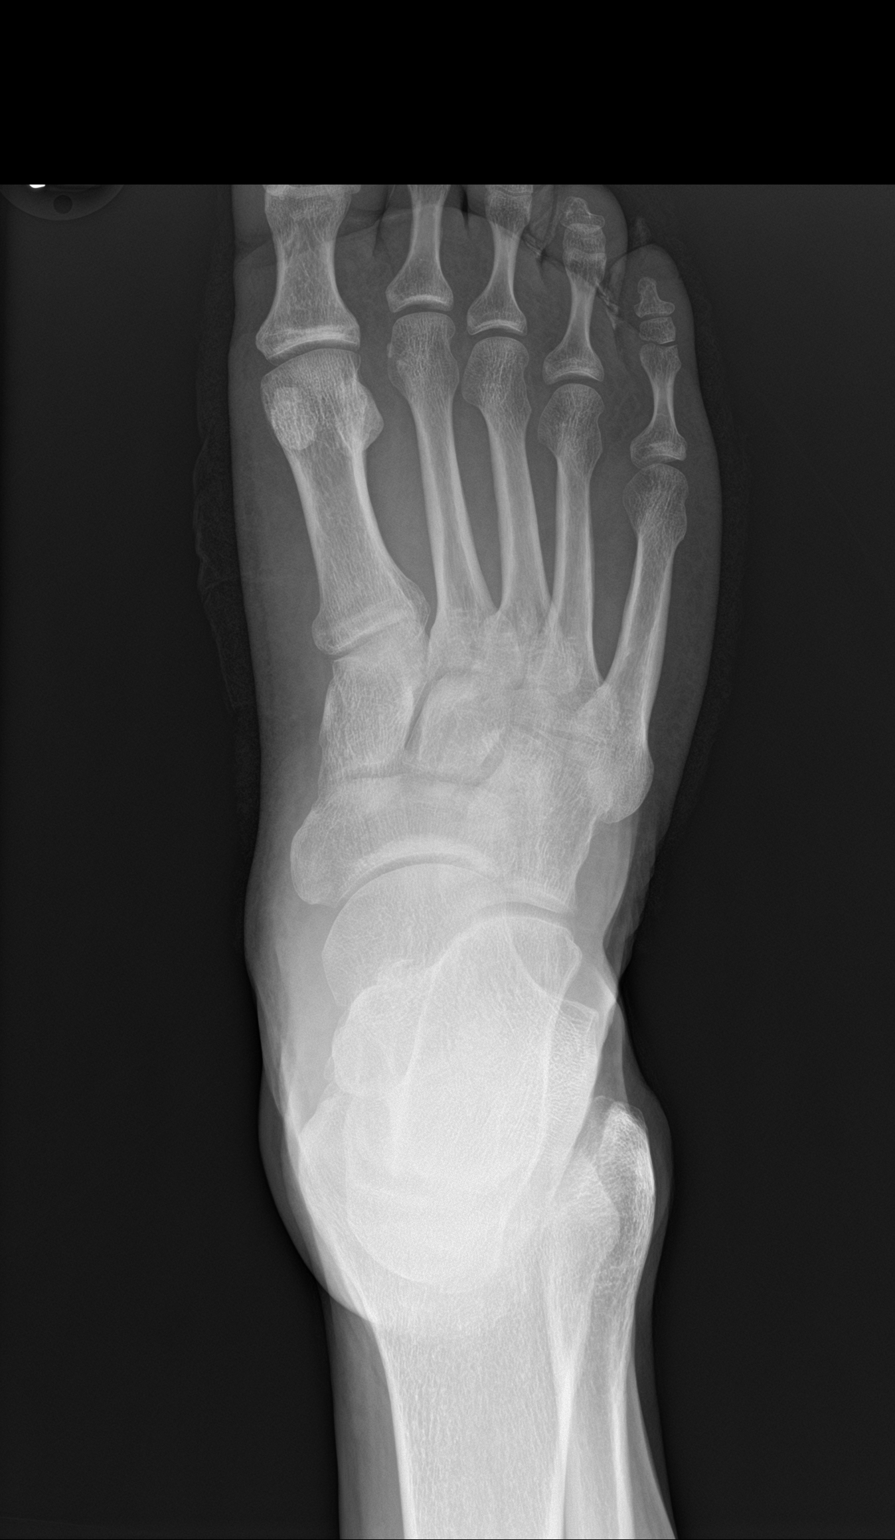
[im 2/3]
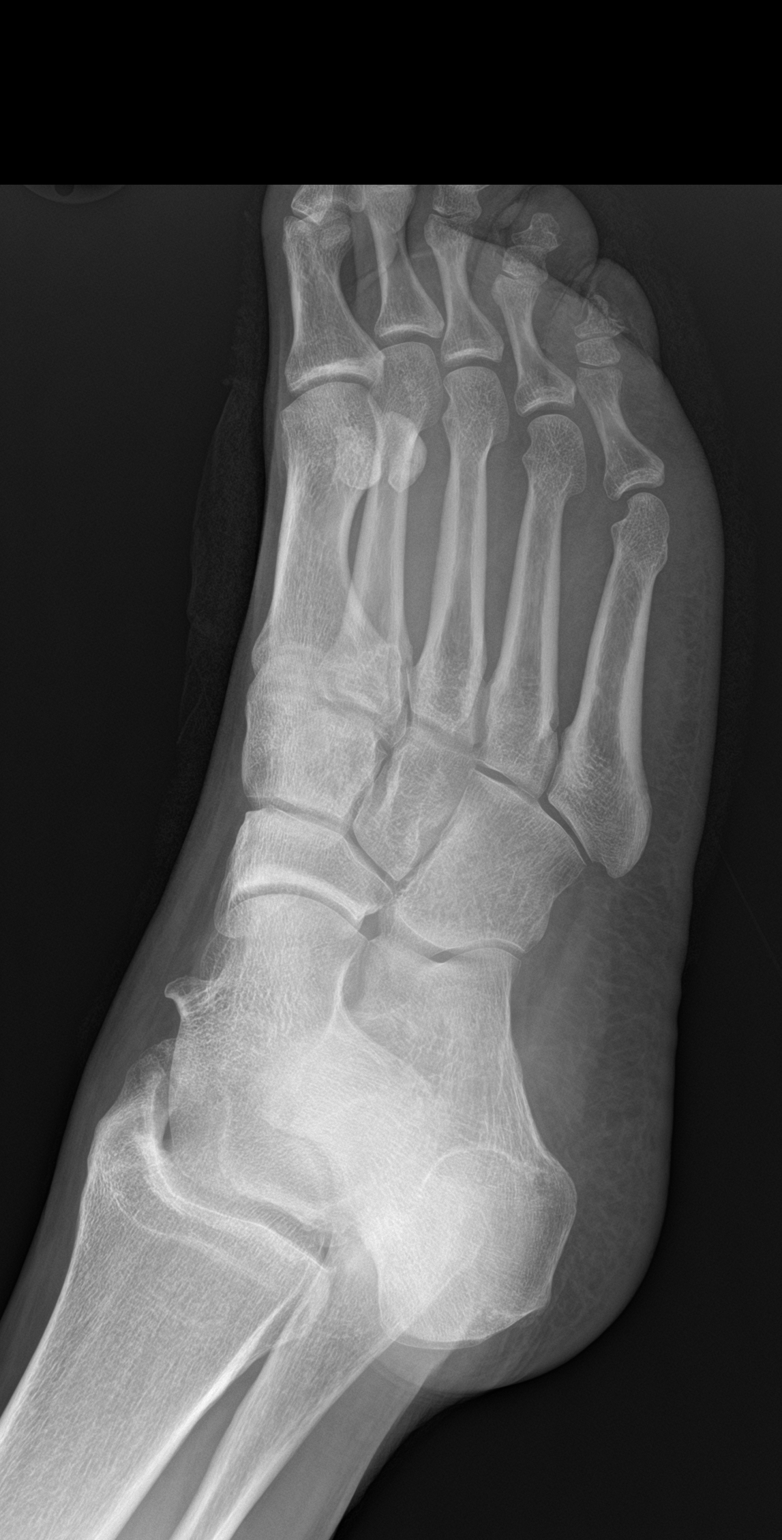
[im 3/3]
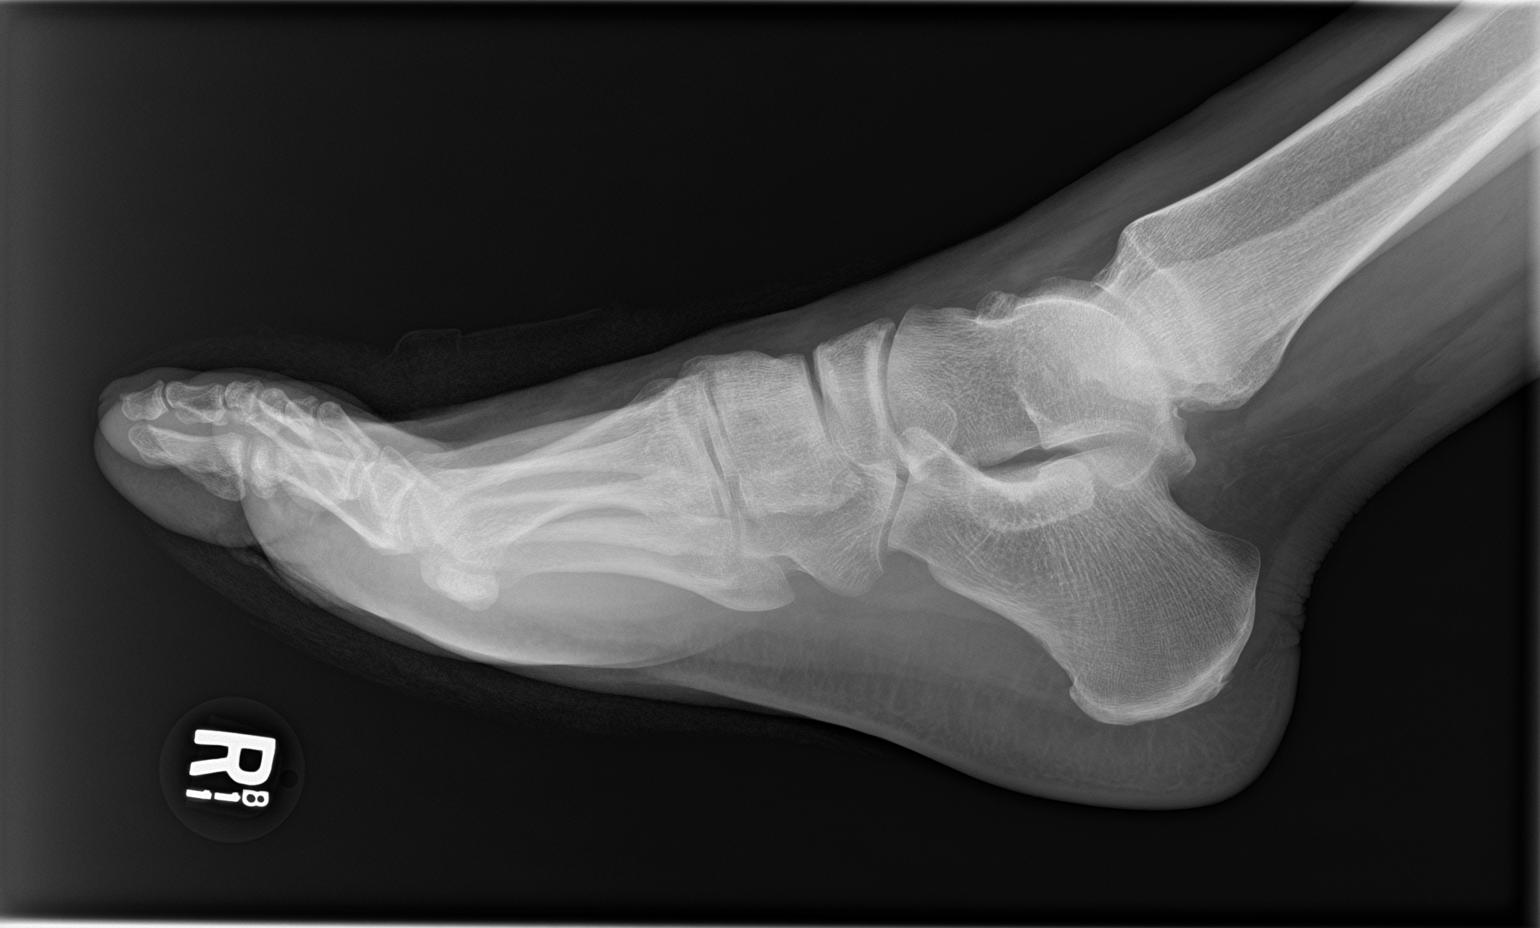

[3 of 3 positions shown; findings below may reference images not displayed]

FINDINGS: No fracture or malalignment. No periostitis or bone destruction. No
gas in the soft tissues.
IMPRESSION: No acute osseous abnormality.

## 2019-09-10 IMAGING — CR DG FOOT COMPLETE 3+V*L*
1 series · 3 of 3 positions shown · non-contrast
Comparison: 08/29/2017

CLINICAL DATA: Open wound to both feet

EXAM:
LEFT FOOT - COMPLETE 3+ VIEW

[Series 1: dg foot complete left · 0.14mm/px · 3 of 3 slices shown]
[im 1/3]
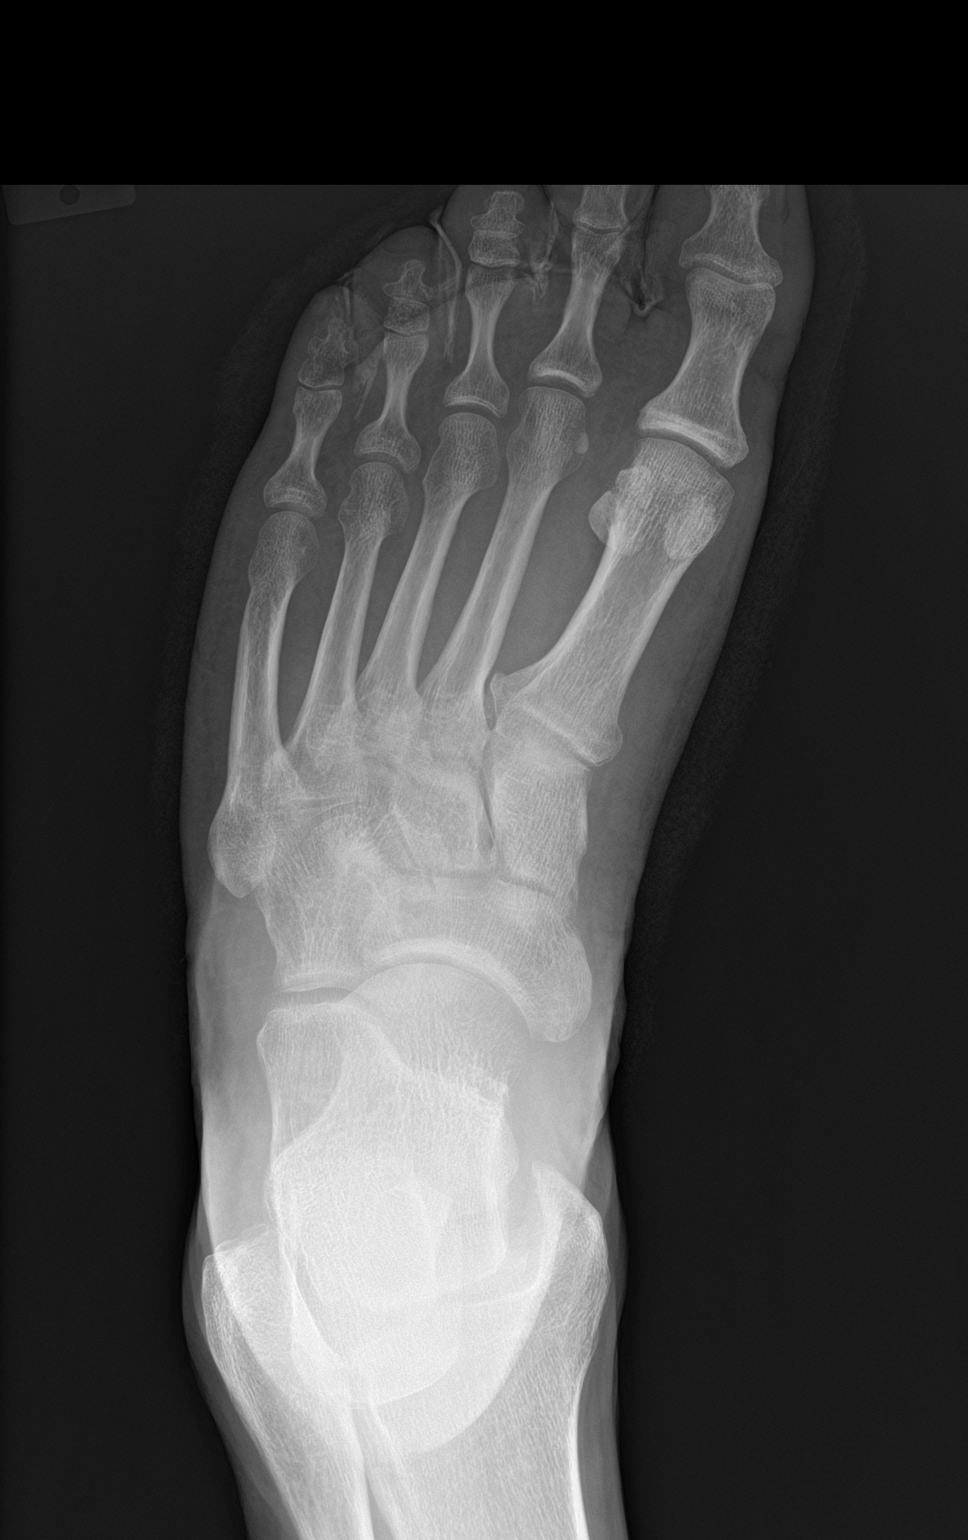
[im 2/3]
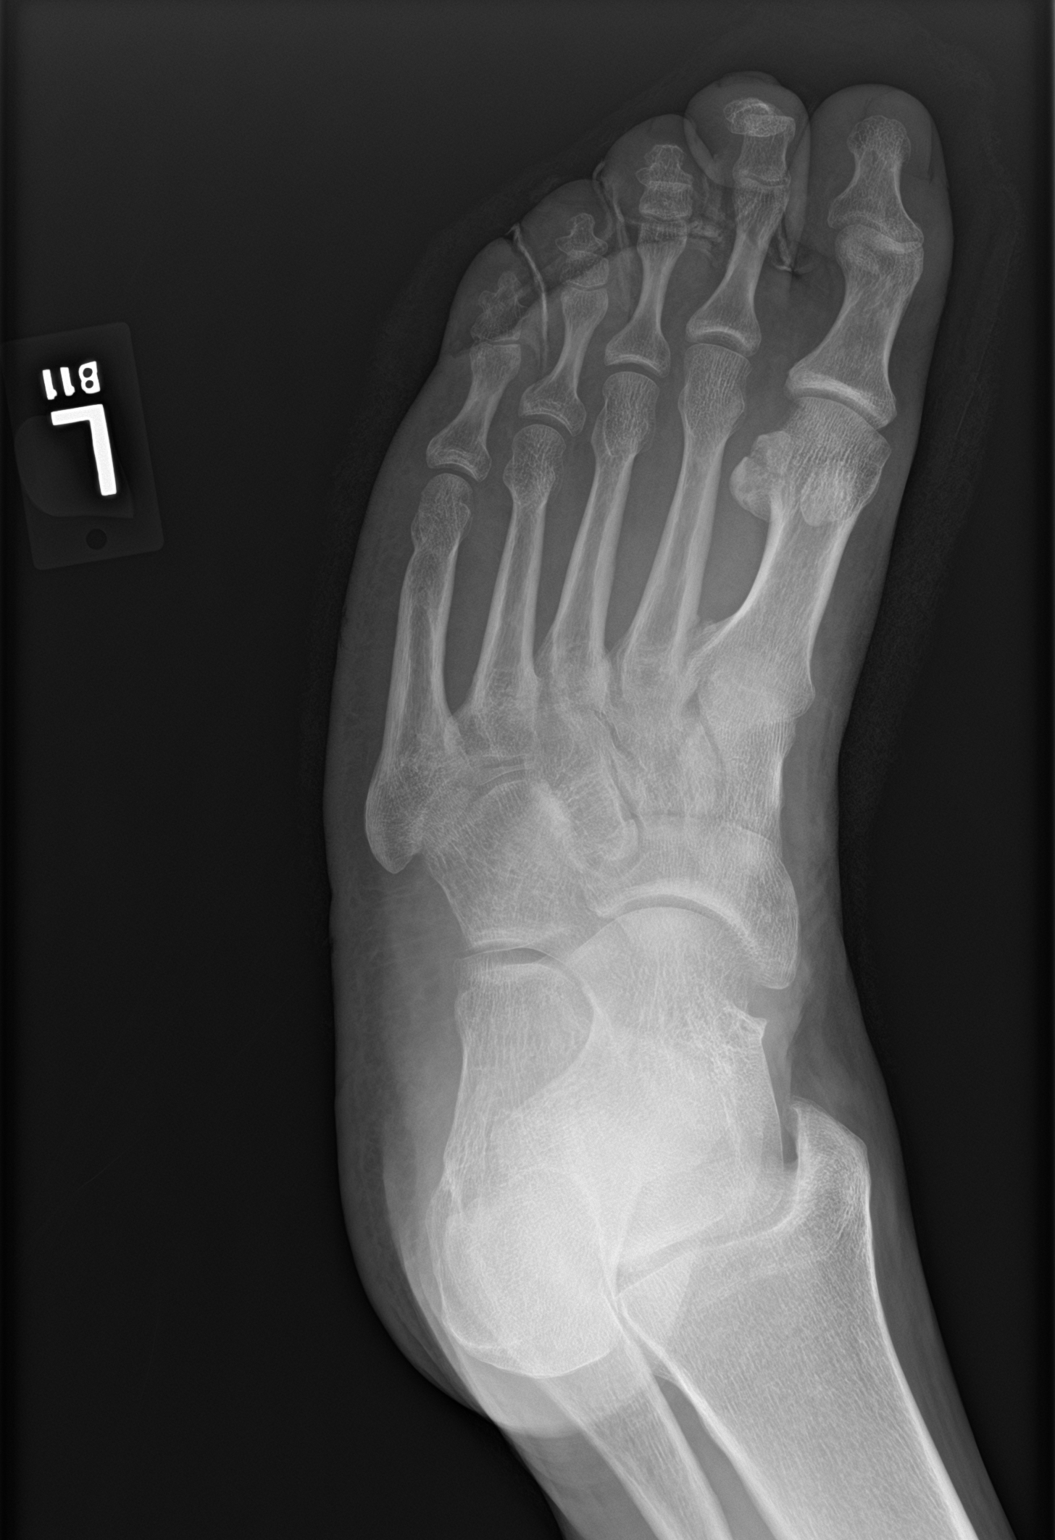
[im 3/3]
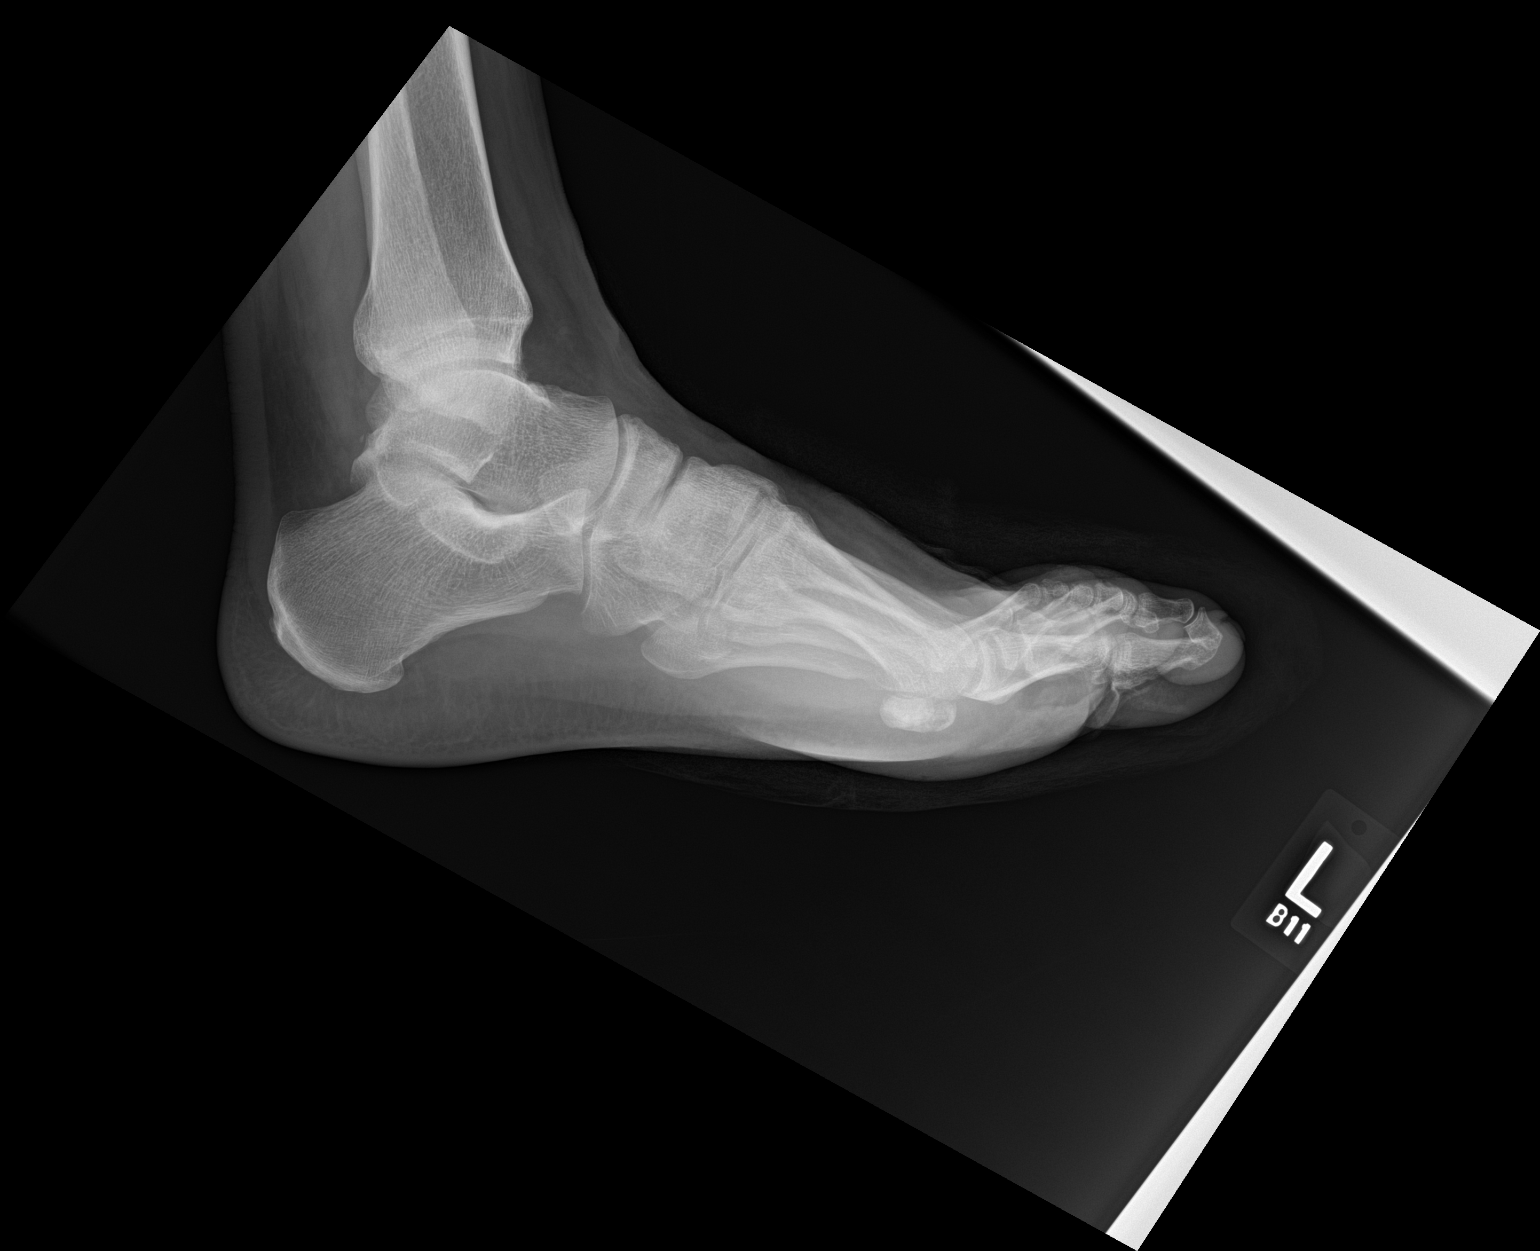

[3 of 3 positions shown; findings below may reference images not displayed]

FINDINGS: No fracture or malalignment is seen. The joint spaces are preserved.

Mild residual osteopenia at the metatarsal heads. The previously
noted indistinct cortex at the head of the fifth metatarsal is less
apparent. Soft tissue ulcer lateral aspect of the distal foot at the
level of the head of the metatarsal is also less apparent.
IMPRESSION: 1. Previously noted ulcer along the lateral aspect of the distal
foot is less apparent. The subtle lucent changes within the cortex
of the head of the fifth metatarsal are also not seen on today's
study
2. No definite acute osseous abnormality
